# Patient Record
Sex: Female | Born: 1950 | Race: White | Hispanic: No | Marital: Married | Smoking: Never smoker
Health system: Southern US, Community
[De-identification: ages and names within clinical notes are randomized; demographics above are authoritative.]

## PROBLEM LIST (undated history)

## (undated) ENCOUNTER — Ambulatory Visit: Admission: EM | Disposition: A | Discharge status: Other Acute Inpatient Hospital | Payer: Medicare HMO

## (undated) DIAGNOSIS — F419 Anxiety disorder, unspecified: Secondary | ICD-10-CM

## (undated) DIAGNOSIS — I2510 Atherosclerotic heart disease of native coronary artery without angina pectoris: Secondary | ICD-10-CM

## (undated) DIAGNOSIS — K222 Esophageal obstruction: Secondary | ICD-10-CM

## (undated) DIAGNOSIS — R0600 Dyspnea, unspecified: Secondary | ICD-10-CM

## (undated) DIAGNOSIS — F329 Major depressive disorder, single episode, unspecified: Secondary | ICD-10-CM

## (undated) DIAGNOSIS — K449 Diaphragmatic hernia without obstruction or gangrene: Secondary | ICD-10-CM

## (undated) DIAGNOSIS — E785 Hyperlipidemia, unspecified: Secondary | ICD-10-CM

## (undated) DIAGNOSIS — R7303 Prediabetes: Secondary | ICD-10-CM

## (undated) DIAGNOSIS — I10 Essential (primary) hypertension: Secondary | ICD-10-CM

## (undated) DIAGNOSIS — Z87442 Personal history of urinary calculi: Secondary | ICD-10-CM

## (undated) DIAGNOSIS — N200 Calculus of kidney: Secondary | ICD-10-CM

## (undated) DIAGNOSIS — I951 Orthostatic hypotension: Secondary | ICD-10-CM

## (undated) DIAGNOSIS — F32A Depression, unspecified: Secondary | ICD-10-CM

## (undated) DIAGNOSIS — K589 Irritable bowel syndrome without diarrhea: Secondary | ICD-10-CM

## (undated) DIAGNOSIS — G4730 Sleep apnea, unspecified: Secondary | ICD-10-CM

## (undated) DIAGNOSIS — K219 Gastro-esophageal reflux disease without esophagitis: Secondary | ICD-10-CM

## (undated) DIAGNOSIS — K5790 Diverticulosis of intestine, part unspecified, without perforation or abscess without bleeding: Secondary | ICD-10-CM

## (undated) HISTORY — DX: Atherosclerotic heart disease of native coronary artery without angina pectoris: I25.10

## (undated) HISTORY — DX: Hyperlipidemia, unspecified: E78.5

## (undated) HISTORY — DX: Calculus of kidney: N20.0

## (undated) HISTORY — DX: Irritable bowel syndrome, unspecified: K58.9

## (undated) HISTORY — DX: Esophageal obstruction: K22.2

## (undated) HISTORY — DX: Depression, unspecified: F32.A

## (undated) HISTORY — DX: Gastro-esophageal reflux disease without esophagitis: K21.9

## (undated) HISTORY — DX: Diaphragmatic hernia without obstruction or gangrene: K44.9

## (undated) HISTORY — PX: COLONOSCOPY: SHX174

## (undated) HISTORY — DX: Diverticulosis of intestine, part unspecified, without perforation or abscess without bleeding: K57.90

## (undated) HISTORY — PX: ABDOMINAL HYSTERECTOMY: SHX81

## (undated) HISTORY — DX: Essential (primary) hypertension: I10

## (undated) HISTORY — DX: Orthostatic hypotension: I95.1

## (undated) HISTORY — PX: TONSILLECTOMY: SUR1361

## (undated) HISTORY — DX: Major depressive disorder, single episode, unspecified: F32.9

## (undated) HISTORY — PX: TUBAL LIGATION: SHX77

## (undated) HISTORY — PX: VESICOVAGINAL FISTULA CLOSURE W/ TAH: SUR271

## (undated) HISTORY — PX: KIDNEY STONE SURGERY: SHX686

---

## 2002-09-06 ENCOUNTER — Ambulatory Visit (HOSPITAL_COMMUNITY): Admission: RE | Admit: 2002-09-06 | Discharge: 2002-09-06 | Payer: Self-pay | Admitting: Internal Medicine

## 2002-09-06 ENCOUNTER — Encounter: Payer: Self-pay | Admitting: Internal Medicine

## 2002-12-24 ENCOUNTER — Encounter: Payer: Self-pay | Admitting: Internal Medicine

## 2002-12-24 ENCOUNTER — Ambulatory Visit (HOSPITAL_COMMUNITY): Admission: RE | Admit: 2002-12-24 | Discharge: 2002-12-24 | Payer: Self-pay | Admitting: Internal Medicine

## 2003-01-30 ENCOUNTER — Encounter: Payer: Self-pay | Admitting: Internal Medicine

## 2003-01-30 ENCOUNTER — Ambulatory Visit (HOSPITAL_COMMUNITY): Admission: RE | Admit: 2003-01-30 | Discharge: 2003-01-30 | Payer: Self-pay | Admitting: Internal Medicine

## 2004-01-04 ENCOUNTER — Emergency Department (HOSPITAL_COMMUNITY): Admission: AD | Admit: 2004-01-04 | Discharge: 2004-01-04 | Payer: Self-pay | Admitting: Internal Medicine

## 2004-03-24 ENCOUNTER — Emergency Department (HOSPITAL_COMMUNITY): Admission: EM | Admit: 2004-03-24 | Discharge: 2004-03-24 | Payer: Self-pay | Admitting: Family Medicine

## 2004-05-25 ENCOUNTER — Emergency Department (HOSPITAL_COMMUNITY): Admission: EM | Admit: 2004-05-25 | Discharge: 2004-05-25 | Payer: Self-pay | Admitting: Family Medicine

## 2004-06-12 ENCOUNTER — Emergency Department (HOSPITAL_COMMUNITY): Admission: EM | Admit: 2004-06-12 | Discharge: 2004-06-12 | Payer: Self-pay | Admitting: Internal Medicine

## 2004-08-20 ENCOUNTER — Other Ambulatory Visit: Admission: RE | Admit: 2004-08-20 | Discharge: 2004-08-20 | Payer: Self-pay | Admitting: *Deleted

## 2004-10-16 ENCOUNTER — Emergency Department (HOSPITAL_COMMUNITY): Admission: EM | Admit: 2004-10-16 | Discharge: 2004-10-16 | Payer: Self-pay | Admitting: Family Medicine

## 2004-11-03 ENCOUNTER — Emergency Department (HOSPITAL_COMMUNITY): Admission: EM | Admit: 2004-11-03 | Discharge: 2004-11-03 | Payer: Self-pay | Admitting: Family Medicine

## 2004-11-27 ENCOUNTER — Emergency Department (HOSPITAL_COMMUNITY): Admission: EM | Admit: 2004-11-27 | Discharge: 2004-11-27 | Payer: Self-pay | Admitting: Family Medicine

## 2004-12-04 ENCOUNTER — Emergency Department (HOSPITAL_COMMUNITY): Admission: EM | Admit: 2004-12-04 | Discharge: 2004-12-04 | Payer: Self-pay | Admitting: Family Medicine

## 2005-03-01 ENCOUNTER — Encounter: Admission: RE | Admit: 2005-03-01 | Discharge: 2005-05-30 | Payer: Self-pay

## 2005-09-13 ENCOUNTER — Ambulatory Visit: Payer: Self-pay | Admitting: Internal Medicine

## 2005-09-14 ENCOUNTER — Ambulatory Visit: Payer: Self-pay | Admitting: Internal Medicine

## 2005-09-14 ENCOUNTER — Encounter (INDEPENDENT_AMBULATORY_CARE_PROVIDER_SITE_OTHER): Payer: Self-pay | Admitting: *Deleted

## 2005-09-14 HISTORY — PX: ESOPHAGOGASTRODUODENOSCOPY: SHX1529

## 2006-03-28 ENCOUNTER — Emergency Department (HOSPITAL_COMMUNITY): Admission: EM | Admit: 2006-03-28 | Discharge: 2006-03-28 | Payer: Self-pay | Admitting: Family Medicine

## 2006-06-15 ENCOUNTER — Emergency Department (HOSPITAL_COMMUNITY): Admission: AD | Admit: 2006-06-15 | Discharge: 2006-06-15 | Payer: Self-pay | Admitting: Family Medicine

## 2006-07-26 ENCOUNTER — Encounter: Admission: RE | Admit: 2006-07-26 | Discharge: 2006-07-26 | Payer: Self-pay | Admitting: Otolaryngology

## 2007-02-20 ENCOUNTER — Emergency Department (HOSPITAL_COMMUNITY): Admission: EM | Admit: 2007-02-20 | Discharge: 2007-02-20 | Payer: Self-pay | Admitting: Family Medicine

## 2007-07-30 ENCOUNTER — Emergency Department (HOSPITAL_COMMUNITY): Admission: EM | Admit: 2007-07-30 | Discharge: 2007-07-30 | Payer: Self-pay | Admitting: Family Medicine

## 2008-04-06 ENCOUNTER — Emergency Department (HOSPITAL_COMMUNITY): Admission: EM | Admit: 2008-04-06 | Discharge: 2008-04-06 | Payer: Self-pay | Admitting: Family Medicine

## 2008-05-22 ENCOUNTER — Other Ambulatory Visit: Admission: RE | Admit: 2008-05-22 | Discharge: 2008-05-22 | Payer: Self-pay | Admitting: Family Medicine

## 2008-09-20 ENCOUNTER — Emergency Department (HOSPITAL_COMMUNITY): Admission: EM | Admit: 2008-09-20 | Discharge: 2008-09-20 | Payer: Self-pay | Admitting: Family Medicine

## 2009-11-09 ENCOUNTER — Emergency Department (HOSPITAL_COMMUNITY): Admission: EM | Admit: 2009-11-09 | Discharge: 2009-11-09 | Payer: Self-pay | Admitting: Emergency Medicine

## 2010-10-01 ENCOUNTER — Encounter: Payer: Self-pay | Admitting: Family Medicine

## 2010-12-05 HISTORY — PX: ANKLE FRACTURE SURGERY: SHX122

## 2011-04-18 ENCOUNTER — Emergency Department (HOSPITAL_COMMUNITY): Payer: Worker's Compensation

## 2011-04-18 ENCOUNTER — Inpatient Hospital Stay (HOSPITAL_COMMUNITY)
Admission: EM | Admit: 2011-04-18 | Discharge: 2011-04-19 | DRG: 494 | Disposition: A | Discharge status: Home Health | Payer: Worker's Compensation | Attending: Orthopedic Surgery | Admitting: Orthopedic Surgery

## 2011-04-18 ENCOUNTER — Inpatient Hospital Stay (HOSPITAL_COMMUNITY): Payer: Worker's Compensation

## 2011-04-18 ENCOUNTER — Inpatient Hospital Stay: Admit: 2011-04-18 | Payer: Worker's Compensation

## 2011-04-18 DIAGNOSIS — I10 Essential (primary) hypertension: Secondary | ICD-10-CM | POA: Diagnosis present

## 2011-04-18 DIAGNOSIS — Z79899 Other long term (current) drug therapy: Secondary | ICD-10-CM

## 2011-04-18 DIAGNOSIS — E785 Hyperlipidemia, unspecified: Secondary | ICD-10-CM | POA: Diagnosis present

## 2011-04-18 DIAGNOSIS — S82853A Displaced trimalleolar fracture of unspecified lower leg, initial encounter for closed fracture: Principal | ICD-10-CM | POA: Diagnosis present

## 2011-04-18 DIAGNOSIS — Y999 Unspecified external cause status: Secondary | ICD-10-CM

## 2011-04-18 DIAGNOSIS — K219 Gastro-esophageal reflux disease without esophagitis: Secondary | ICD-10-CM | POA: Diagnosis present

## 2011-04-18 DIAGNOSIS — Y9229 Other specified public building as the place of occurrence of the external cause: Secondary | ICD-10-CM

## 2011-04-18 DIAGNOSIS — Y939 Activity, unspecified: Secondary | ICD-10-CM

## 2011-04-18 DIAGNOSIS — W108XXA Fall (on) (from) other stairs and steps, initial encounter: Secondary | ICD-10-CM | POA: Diagnosis present

## 2011-04-18 LAB — CBC
HCT: 33.6 % — ABNORMAL LOW (ref 36.0–46.0)
Hemoglobin: 11.3 g/dL — ABNORMAL LOW (ref 12.0–15.0)
MCH: 27.9 pg (ref 26.0–34.0)
MCHC: 33.6 g/dL (ref 30.0–36.0)
MCV: 83 fL (ref 78.0–100.0)
Platelets: 345 K/uL (ref 150–400)
RBC: 4.05 MIL/uL (ref 3.87–5.11)
RDW: 13.5 % (ref 11.5–15.5)
WBC: 8.7 K/uL (ref 4.0–10.5)

## 2011-04-18 LAB — DIFFERENTIAL
Basophils Absolute: 0 K/uL (ref 0.0–0.1)
Basophils Relative: 0 % (ref 0–1)
Eosinophils Absolute: 0.2 K/uL (ref 0.0–0.7)
Eosinophils Relative: 2 % (ref 0–5)
Lymphocytes Relative: 22 % (ref 12–46)
Lymphs Abs: 1.9 10*3/uL (ref 0.7–4.0)
Monocytes Absolute: 0.6 K/uL (ref 0.1–1.0)
Monocytes Relative: 7 % (ref 3–12)
Neutro Abs: 6 10*3/uL (ref 1.7–7.7)
Neutrophils Relative %: 68 % (ref 43–77)

## 2011-04-18 LAB — BASIC METABOLIC PANEL
BUN: 18 mg/dL (ref 6–23)
CO2: 28 mEq/L (ref 19–32)
Calcium: 8.4 mg/dL (ref 8.4–10.5)
Creatinine, Ser: 0.94 mg/dL (ref 0.4–1.2)
GFR calc Af Amer: >60 mL/min (ref >60)
Glucose, Bld: 115 mg/dL — ABNORMAL HIGH (ref 70–99)

## 2011-04-18 LAB — BASIC METABOLIC PANEL WITH GFR
Chloride: 104 meq/L (ref 96–112)
GFR calc non Af Amer: >60 mL/min (ref >60)
Potassium: 3.1 meq/L — ABNORMAL LOW (ref 3.5–5.1)
Sodium: 141 meq/L (ref 135–145)

## 2011-04-18 LAB — PROTIME-INR
INR: 0.96 (ref 0.00–1.49)
Prothrombin Time: 13 seconds (ref 11.6–15.2)

## 2011-04-18 LAB — APTT: aPTT: 27 s (ref 24–37)

## 2011-04-18 LAB — TYPE AND SCREEN
ABO/RH(D): A POS
Antibody Screen: NEGATIVE

## 2011-04-18 LAB — ABO/RH: ABO/RH(D): A POS

## 2011-04-19 LAB — CBC
MCH: 27.7 pg (ref 26.0–34.0)
MCHC: 33.2 g/dL (ref 30.0–36.0)
Platelets: 284 10*3/uL (ref 150–400)
RBC: 3.58 MIL/uL — ABNORMAL LOW (ref 3.87–5.11)
RDW: 13.4 % (ref 11.5–15.5)

## 2011-04-19 LAB — BASIC METABOLIC PANEL
Calcium: 7.9 mg/dL — ABNORMAL LOW (ref 8.4–10.5)
Creatinine, Ser: 0.75 mg/dL (ref 0.4–1.2)
GFR calc Af Amer: >60 mL/min (ref >60)
GFR calc non Af Amer: >60 mL/min (ref >60)

## 2011-04-24 NOTE — Consult Note (Signed)
NAME:  Debbie [name], Debbie [name]               ACCOUNT NO.:  [id number]  MEDICAL RECORD NO.:  [id number]           PATIENT TYPE:  I  LOCATION:  1603                         FACILITY:  [facility]  PHYSICIAN:  Estill Bamberg, MD      DATE OF BIRTH:  [birth year]  DATE OF CONSULTATION:  04/18/2011 DATE OF DISCHARGE:                                CONSULTATION   REASON FOR CONSULTATION:  Right ankle fracture.  HISTORY OF PRESENT ILLNESS:  Debbie [name] is a pleasant 60 year old female who I was asked to see in consultation by the [facility] Emergency Department.  Per the patient, she is in the process of getting a mammogram earlier today.  The patient lost her footing and her ankle twisted from beneath her.  Her ankle confirmed twisting maneuver and she fell on her back and had immediate onset of pain in the right ankle. The patient was ultimately evaluated at the [facility] Emergency Department, evaluated and diagnosed with a dislocated right ankle.  I did talk to the emergency room physician personally, and it did appear that the emergency room physician did feel that the ankle was reduced however, I did subsequent review of the radiographs which were consistent with ongoing as posterior subluxation of the talus in relation to the tibia.  I had seen.  I therefore did feel that this that this was a unstable fracture.  I did come to evaluate the patient for consideration of external fixation.  The patient denied any numbness or tingling in her bilateral feet.  She denied any pain elsewhere other than in her right ankle.  She described the pain as moderate-to-severe but the pain did decrease at the time of my evaluation with the patient.  PAST MEDICAL HISTORY:  Hypertension, hyperlipidemia in addition to her gastroesophageal reflux disease.  PAST SURGICAL HISTORY:  Hysterectomy and kidney stone removal.  MEDICATIONS:  Benazepril, Cymbalta, Lipitor, Nexium, Trilipix, Zyrtec, and  potassium.  ALLERGIES:  No known drug allergies.  SOCIAL HISTORY:  The patient denies alcohol or tobacco use.  PHYSICAL EXAMINATION:  The patient's right lower extremity is immobilized in a posterior mold splint.  She does have full sensation throughout all 5 toes.  The skin about the proximal leg and toes are intact.  The posterior mold splint is well applied.  The patient does have positive FHL and EHL which is limited due to pain and swelling. There is noted to be swelling about the toes.  Her capillary refill is less than 2 seconds.  Visualized skin is noted to be intact.  Three views of the patient's right ankle from prior and subsequent to her attempted right ankle reduction are significant with a significant trimalleolar ankle fracture with ongoing posterior subluxation of the talus.  The posterior malleolar fragment does occupy approximately 70% of the articular surface of the tibia.  This clearly is an unstable ankle fracture.  ASSESSMENT:  Trimalleolar right ankle fracture, unstable, and dislocated posteriorly status post one reduction attempt.  PLAN:  I did have an extensive discussion with Ms. Kopinski and her husband.  I did let her know that since her previous  reduction has been unsuccessful in given the radiographs noted, this is an unstable injury requiring operative intervention.  I did explain temporizing the patient with an external fixator to help restore the mortise of the ankle joint. The ultimate plan following her external fixation is to discharge the patient home and her followup with Dr. Toni Arthurs.  She was given Dr. Laverta Baltimore contact information, and I did personally speak with Dr. Victorino Dike myself.  I did fully explain the risks and limitations of external fixation and the patient did agree to go forward.  The plan is to go ahead with external fixation of the ankle within the next 30 minutes.     Estill Bamberg, MD     MD/MEDQ  D:  04/18/2011  T:   04/19/2011  Job:  [id number]  cc:   Toni Arthurs, MD Fax: [phone]  Electronically Signed by Estill Bamberg  on 04/24/2011 02:24:36 PM

## 2011-04-24 NOTE — Op Note (Signed)
  NAME:  RYLEI, MASELLA               ACCOUNT NO.:  [id number]  MEDICAL RECORD NO.:  [id number]           PATIENT TYPE:  I  LOCATION:  1603                         FACILITY:  [facility]  PHYSICIAN:  Estill Bamberg, MD      DATE OF BIRTH:  Oct 10, 1951  DATE OF PROCEDURE:  04/18/2011 DATE OF DISCHARGE:                              OPERATIVE REPORT   PREOPERATIVE DIAGNOSIS:  Unstable right ankle fracture.  POSTOPERATIVE DIAGNOSIS:  Unstable right ankle fracture.  PROCEDURES: 1. Closed reduction of right dislocated ankle. 2. Placement of external fixator to the right ankle. 3. Intraoperative use of fluoroscopy.  INSTRUMENTATION USED:  Synthes  SURGEON:  Estill Bamberg, MD  ASSISTANT:  None.  ANESTHESIA:  General endotracheal anesthesia.  COMPLICATIONS:  None.  DISPOSITION:  Stable.  INDICATIONS FOR PROCEDURE:  Briefly, Ms. Grether is a very pleasant 60- year-old female who [facility] Emergency Departmentwith a dislocated right ankle.  Radiographs did reveal a trimalleolar ankle fracture.  An attempted reduction was performed by emergency room physician, but the patient did re-dislocate.  This was diagnosed for an unstable ankle fracture.  I did have a discussion with the patient regarding going forward closed reduction and external fixation of right ankle.  The patient fully understood the risks and limitations of the procedure.  OPERATIVE DETAILS:  On Apr 18, 2011, the patient was brought to Surgery and general endotracheal anesthesia was administered.  The right ankle and foot were prepped and draped in the usual sterile fashion.  Two grams of Ancef was given for antibiotic prophylaxis.  A time-out procedure was performed.  I did place 2 external fixation pins in the diaphysis of the tibia and a single pin in the calcaneus.  I then performed a closed reduction under live fluoroscopy and I did note an obvious reduction of the posteriorly dislocated ankle joint.   I did hold the foot anteriorly while an assistant placed bars to hook up with the pins in the tibia and calcaneus.  The bladder were locked down securely in the usual fashion.  I did then again obtain the AP and lateral views of the ankle joint which did confirm adequate reduction.  The patient's cap refill was less than 2 seconds at all toes.  Xeroform was placed overlying the pin sites. Kerlix and Webril was also placed followed by an Ace bandage.  The patient was then awoken from general endotracheal anesthesia and transferred to recovery in stable condition.     Estill Bamberg, MD     MD/MEDQ  D:  04/18/2011  T:  04/19/2011  Job:  [id number]  cc:   Toni Arthurs, MD Fax: [phone]  Electronically Signed by Estill Bamberg  on 04/24/2011 02:24:40 PM

## 2011-04-25 ENCOUNTER — Ambulatory Visit
Admission: RE | Admit: 2011-04-25 | Discharge: 2011-04-25 | Disposition: A | Discharge status: Home / Self Care | Payer: Worker's Compensation | Source: Ambulatory Visit | Attending: Orthopedic Surgery | Admitting: Orthopedic Surgery

## 2011-04-25 ENCOUNTER — Other Ambulatory Visit: Payer: Self-pay | Admitting: Orthopedic Surgery

## 2011-04-25 DIAGNOSIS — T07XXXA Unspecified multiple injuries, initial encounter: Secondary | ICD-10-CM

## 2011-04-29 ENCOUNTER — Ambulatory Visit (HOSPITAL_COMMUNITY): Payer: Worker's Compensation

## 2011-04-29 ENCOUNTER — Ambulatory Visit (HOSPITAL_BASED_OUTPATIENT_CLINIC_OR_DEPARTMENT_OTHER)
Admission: RE | Admit: 2011-04-29 | Discharge: 2011-04-30 | Disposition: A | Discharge status: Home / Self Care | Payer: Worker's Compensation | Source: Ambulatory Visit | Attending: Orthopedic Surgery | Admitting: Orthopedic Surgery

## 2011-04-29 DIAGNOSIS — Y990 Civilian activity done for income or pay: Secondary | ICD-10-CM | POA: Insufficient documentation

## 2011-04-29 DIAGNOSIS — W19XXXA Unspecified fall, initial encounter: Secondary | ICD-10-CM | POA: Insufficient documentation

## 2011-04-29 DIAGNOSIS — Z4789 Encounter for other orthopedic aftercare: Secondary | ICD-10-CM | POA: Insufficient documentation

## 2011-04-29 DIAGNOSIS — S82843A Displaced bimalleolar fracture of unspecified lower leg, initial encounter for closed fracture: Secondary | ICD-10-CM | POA: Insufficient documentation

## 2011-04-29 DIAGNOSIS — S82209A Unspecified fracture of shaft of unspecified tibia, initial encounter for closed fracture: Secondary | ICD-10-CM | POA: Insufficient documentation

## 2011-04-29 DIAGNOSIS — S82409A Unspecified fracture of shaft of unspecified fibula, initial encounter for closed fracture: Secondary | ICD-10-CM | POA: Insufficient documentation

## 2011-04-29 DIAGNOSIS — Y9269 Other specified industrial and construction area as the place of occurrence of the external cause: Secondary | ICD-10-CM | POA: Insufficient documentation

## 2011-04-29 LAB — POCT I-STAT, CHEM 8
HCT: 35 % — ABNORMAL LOW (ref 36.0–46.0)
Hemoglobin: 11.9 g/dL — ABNORMAL LOW (ref 12.0–15.0)
Potassium: 3.6 mEq/L (ref 3.5–5.1)
Sodium: 139 mEq/L (ref 135–145)
TCO2: 32 mmol/L (ref 0–100)

## 2011-05-03 NOTE — Op Note (Signed)
NAME:  Debbie [name], Debbie [name]               ACCOUNT NO.:  [id number]  MEDICAL RECORD NO.:  [id number]           PATIENT TYPE:  LOCATION:                                 FACILITY:  PHYSICIAN:  Toni Arthurs, MD        DATE OF BIRTH:  1951-04-13  DATE OF PROCEDURE:  04/29/2011 DATE OF DISCHARGE:                              OPERATIVE REPORT   PREOPERATIVE DIAGNOSES: 1. Right tibial pilon and fibula fractures. 2. Status post closed reduction and external fixation of above     injuries.  POSTOPERATIVE DIAGNOSES: 1. Right tibial pilon and fibula fractures. 2. Status post closed reduction and external fixation of above     injuries.  PROCEDURE: 1. Removal of external fixation under anesthesia. 2. Open reduction and internal fixation of right tibial pilon and     fibula fractures. 3. Intraoperative interpretation of fluoroscopic images greater than 1     hour. 4. Stress fluoroscopic examination of the right ankle.  SURGEON:  Toni Arthurs, MD  ANESTHESIA:  General, regional.  IV FLUIDS:  See anesthesia record.  ESTIMATED BLOOD LOSS:  Minimal.  TOURNIQUET TIME:  Two hours and 41 minutes at 250 mmHg.  COMPLICATIONS:  None apparent.  DISPOSITION:  Extubated, awake, and stable to recovery.  INDICATIONS FOR PROCEDURE:  The patient is a 60 year old female without significant past medical history who fell at work approximately a week ago.  She sustained a severe fracture of the right distal tibia and fibula.  She was taken to surgery that night by Dr. Estill Bamberg where she underwent closed reduction and application of an external fixator. She now presents for definitive treatment of this injury.  She understands the risks and benefits of this procedure as well as the alternative treatment options.  Specifically, she understands risks of bleeding, infection, nerve damage, blood clots, need for additional surgery, amputation, and death.  PROCEDURE IN DETAIL:  After preoperative consent  was obtained and the correct operative site was identified, the patient was brought to the operating room and placed supine on the operating table.  General anesthesia was induced.  Preoperative antibiotics were administered. Surgical time-out was taken.  The external fixator frame was removed in its entirety including the two half pins in the tibia and the transfixion pin in the calcaneus.  The right lower extremity was then prepped and draped in standard sterile fashion.  The pinholes were all curetted and debrided of superficial eschar and necrotic tissue.  All of the pin tracts were irrigated copiously.  The pin tracts were all covered with Ioban drape.  The extremity was exsanguinated and the tourniquet was inflated to 250 mmHg.  Medial longitudinal incision was marked on the skin overlying the fracture site in the medial malleolus. This was made and sharp dissection was carried down through the skin and subcutaneous tissue taking care to protect the branches of the saphenous vein and nerve.  The fracture site was identified.  This was a coronal oblique split from the posterior aspect of the tibia proximally to central portion of the pilon distally.  There was comminution at the level of the  tibial plafond.  The fracture site was opened and cleaned of all hematoma.  The central fragment of articular cartilage was reduced.  The large posterior fragment was then reduced and pinned into position.  It was also clamped with a tenaculum.  The medial malleolus fragment was reduced and pinned into position as well.  AP and lateral views were obtained showing appropriate reduction of the fracture site. Three cannulated 4-mm partially-threaded screws were inserted across the primary fracture line.  Two of these were inserted from anterior to posterior.  The third was inserted from posterior to anterior with a washer.  This acted as a buttress at the apex of the fracture site.  The medial  malleolus fracture fragment was then fixed with a cannulated 4-mm screw.  The posterior fragment of the medial malleolus was then pinned in position in a reduced fashion.  A 2 x 7 solid lag screw was inserted after pre-drilling.  The large medial cortical fragment was then reduced and pinned into position.  Again, it was fixed with a 4-mm cannulated screw.  AP, lateral, and mortise views again showed appropriate reduction of the fracture site and appropriate position and length of all hardware.  The wound was irrigated copiously.  Inverted simple sutures of 0 Vicryl were used to close the periosteum over the fracture site.  Subcutaneous tissue was approximated with inverted simple sutures of 3-0 Monocryl.  The skin was closed with a running 3-0 Prolene suture.  Attention was then turned to the lateral aspect of the ankle.  A longitudinal incision was made over the fibula.  Sharp dissection was carried down through the skin.  Blunt dissection was carried down to the subcutaneous tissues adjacent to the lateral fibula.  There was severe comminution noted at the distal aspect of the fibula just adjacent to the syndesmosis.  One of the fragments anteriorly was turned 180 degrees on its periosteal flap.  The fracture site was opened and cleaned of all hematoma and free fragments of bone.  The anterior fragment was derotated and reduced anatomically.  The fracture distally then was reduced and clamped into position with two tenaculum.  Two 3.5-mm fully- threaded lag screws were inserted across the fracture site from anterior to posterior.  Both of these were noted to have excellent compression and held the fracture reduced anatomically.  A 10-hole one-third tubular plate was then contoured to fit the lateral aspect of the fibula.  It was secured with three bicortical screws proximally, one bicortical screws centrally, and three unicortical screws distally.  A mortise view was then obtained  showing appropriate reduction of the fibular fracture. A stress mortise view was then obtained under live fluoroscopy showing no widening at the medial clear space or at the syndesmosis.  Final AP, lateral, and mortise views of the ankle showed appropriate reduction of the fracture site as well as appropriate position and length of all hardware.  The lateral wound was irrigated copiously.  The subcutaneous tissue was approximated over the plate with 0 Vicryl sutures.  The subcutaneous tissue was then closed with simple sutures of 3-0 Monocryl and 3-0 running Prolene suture was used to close the skin incision. Sterile dressings were applied followed by a well-padded short-leg splint with the ankle positioned in neutral.  The tourniquet was released at 2 hours and 41 minutes after application of the dressings. The patient was then awakened by Anesthesia and transported to the recovery room in stable condition.  FOLLOWUP PLAN:  The patient will be  observed overnight for pain control. She will follow up with me in 2 weeks for suture removal and conversion to a cast.     Toni Arthurs, MD     JH/MEDQ  D:  04/29/2011  T:  04/30/2011  Job:  [id number]  cc:   Estill Bamberg, MD  Electronically Signed by Toni Arthurs  on 05/03/2011 01:39:09 PM

## 2011-05-17 NOTE — Discharge Summary (Signed)
  NAME:  Debbie [name], Debbie [name] NO.:  [id number]  MEDICAL RECORD NO.:  [id number]  LOCATION:  1603                         FACILITY:  [facility]  PHYSICIAN:  Estill Bamberg, MD      DATE OF BIRTH:  10/22/1951  DATE OF ADMISSION:  04/18/2011 DATE OF DISCHARGE:  04/19/2011                              DISCHARGE SUMMARY   ADMISSION DIAGNOSIS:  Right tibial pilon fracture.  DISCHARGE DIAGNOSIS:  Right tibial pilon fracture, status post closed reduction and placement of external fixator.  ADMISSION HISTORY:  Briefly, Ms. Kole is a pleasant 60 year old female who presented to the emergency department on Apr 18, 2011, with an extremely unstable right ankle fracture with tibial pilon component. A closed reduction was attempted in the ER and the reduction did fail and the patient did go on to dislocate.  I was called to evaluate the patient.  I did diagnose the patient with an unstable right ankle fracture and I did bring the patient to Surgery.  HOSPITAL COURSE:  On Apr 18, 2011, the patient was brought to Surgery and underwent closed reduction and placement of an external fixator to the right ankle.  The patient tolerated the procedure well and was transferred to recovery in stable condition.  The patient was observed overnight for pain control.  An excellent reduction was noted.  The patient was evaluated on the morning of postop day #1 and was ultimately discharged home.  DISCHARGE INSTRUCTIONS:  The patient will be maintained in her external fixator.  She will follow up with Dr. Toni Arthurs for definitive fixation of her fracture.  All of her questions were answered in their entirety.     Estill Bamberg, MD     MD/MEDQ  D:  05/11/2011  T:  05/11/2011  Job:  [id number]  Electronically Signed by Estill Bamberg  on 05/17/2011 04:45:16 PM

## 2011-09-16 LAB — POCT URINALYSIS DIP (DEVICE)
Glucose, UA: NEGATIVE
Nitrite: POSITIVE — AB
Operator id: 116391
Urobilinogen, UA: 1

## 2011-09-16 LAB — POCT I-STAT CREATININE: Operator id: 235561

## 2011-09-16 LAB — URINALYSIS, ROUTINE W REFLEX MICROSCOPIC
Hgb urine dipstick: NEGATIVE
Protein, ur: NEGATIVE
Urobilinogen, UA: 1

## 2011-09-16 LAB — DIFFERENTIAL
Basophils Relative: 0
Eosinophils Absolute: 0
Monocytes Absolute: 0.4
Monocytes Relative: 4
Neutrophils Relative %: 88 — ABNORMAL HIGH

## 2011-09-16 LAB — I-STAT 8, (EC8 V) (CONVERTED LAB)
Acid-Base Excess: 4 — ABNORMAL HIGH
Chloride: 101
Potassium: 3 — ABNORMAL LOW
pH, Ven: 7.421 — ABNORMAL HIGH

## 2011-09-16 LAB — CBC
Hemoglobin: 14
MCHC: 33.6
MCV: 84.1
RBC: 4.97
RDW: 14

## 2011-09-16 LAB — URINE CULTURE

## 2011-09-16 LAB — HEPATIC FUNCTION PANEL
Bilirubin, Direct: 0.2
Indirect Bilirubin: 0.7
Total Bilirubin: 0.9

## 2012-08-09 ENCOUNTER — Other Ambulatory Visit: Payer: 59

## 2012-08-09 ENCOUNTER — Ambulatory Visit (INDEPENDENT_AMBULATORY_CARE_PROVIDER_SITE_OTHER): Payer: 59 | Admitting: Internal Medicine

## 2012-08-09 ENCOUNTER — Encounter: Payer: Self-pay | Admitting: Internal Medicine

## 2012-08-09 VITALS — BP 126/70 | HR 95 | Ht 64.0 in | Wt 178.6 lb

## 2012-08-09 DIAGNOSIS — J0101 Acute recurrent maxillary sinusitis: Secondary | ICD-10-CM

## 2012-08-09 DIAGNOSIS — J302 Other seasonal allergic rhinitis: Secondary | ICD-10-CM

## 2012-08-09 DIAGNOSIS — J309 Allergic rhinitis, unspecified: Secondary | ICD-10-CM

## 2012-08-09 DIAGNOSIS — J45909 Unspecified asthma, uncomplicated: Secondary | ICD-10-CM

## 2012-08-09 DIAGNOSIS — J0100 Acute maxillary sinusitis, unspecified: Secondary | ICD-10-CM

## 2012-08-09 NOTE — Patient Instructions (Addendum)
Order- Lab Allergy Profile  Dx allergic rhinitis  Schedule return for allergy skin testing- Antihistamines block the test, so PLEASE-      No antihistamine products for 3 days before your skin tests: including cough, allergy  and cold remedies, otc sleep meds. This would include your zyrtec.

## 2012-08-09 NOTE — Progress Notes (Signed)
08/09/12- 61 yoF never smoker self-referred for allergy evaluation because of recurrent sinus infections in the past 10 years or more.She describes itching of skin and eyes with nasal congestion and maxillary pressure which may hurt her teeth as much as one time per month. He finds that early antibiotic intervention helps. Perennial nasal congestion and rhinitis began around age 61 and she blames environmental allergy. Eyes and throat itch very soon after exposure to cat. Allergy skin testing 20 years ago. She does not remember the results but was not on allergy vaccine. Has had episodes of asthmatic bronchitis but no formal diagnosis of asthma or pneumonia. No history of food intolerance or skin rash. Large local reactions to bumblebee sting. Tonsils out otherwise no ENT surgery. Uses saline nasal spray. Uses Flonase only if she feels she is getting sick. Last prednisone was 3 or 4 days ago. 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 Authorizing Provider  benazepril-hydrochlorthiazide (LOTENSIN HCT) 20-12.5 MG per tablet Take 1 tablet by mouth Twice daily. 08/07/12  Yes Historical Provider, MD  cetirizine (ZYRTEC) 10 MG tablet Take 10 mg by mouth daily.   Yes Historical Provider, MD  Choline Fenofibrate (TRILIPIX) 45 MG capsule Take 45 mg by mouth daily.   Yes Historical Provider, MD  CYMBALTA 60 MG capsule Take 2 tablets by mouth daily. 05/21/12  Yes Historical Provider, MD  fluticasone (FLONASE) 50 MCG/ACT nasal spray Place 2 sprays into both nostrils daily. 05/09/12  Yes Historical Provider, MD  KLOR-CON M20 20 MEQ tablet Take 1 tablet by mouth daily. 08/06/12  Yes Historical Provider, MD  NEXIUM 40 MG capsule Take 1 tablet by mouth daily. 05/21/12  Yes Historical Provider, MD  simvastatin (ZOCOR) 40 MG tablet  Take 1 tablet by mouth daily. 07/30/12  Yes Historical Provider, MD  VENTOLIN HFA 108 (90 BASE) MCG/ACT inhaler Inhale 2 puffs into the lungs 4 (four) times daily as needed. 06/10/12  Yes Historical Provider, MD   Past Medical History  Diagnosis Date  . Hyperlipidemia   . ALLERGIC RHINITIS   . Hypertension    Past Surgical History  Procedure Date  . Tubal ligation   . Vesicovaginal fistula closure w/ tah    Family History  Problem Relation Age of Onset  . Allergies Daughter   . Allergies Son   . Allergies Mother   . Heart disease Mother     bypass surgery  . Heart disease Father     bypass surgery   History   Social History  . Marital Status: Married    Spouse Name: N/A    Number of Children: N/A  . Years of Education: N/A   Occupational History  . Customer Service @ [employer]    Social History Main Topics  . Smoking status: Never Smoker   . Smokeless tobacco: Not on file  . Alcohol Use: No  . Drug Use: No  . Sexually Active: Not on file   Other Topics Concern  . Not on file   Social History Narrative  . No narrative on file    ROS-see HPI Constitutional:   No-   weight loss, night sweats, fevers, chills, fatigue, lassitude. HEENT:   No-  headaches, difficulty swallowing, tooth/dental problems, sore throat,       + sneezing, itching, ear ache, nasal congestion,  post nasal drip,  CV:  No-   chest pain, orthopnea, PND, swelling in lower extremities, anasarca,  dizziness, palpitations Resp: No-   shortness of breath with exertion or at rest.              No-   productive cough,  No non-productive cough,  No- coughing up of blood.              No-   change in color of mucus.  No- wheezing.   Skin: No-   rash or lesions. GI:  No-   heartburn, indigestion, abdominal pain, nausea, vomiting, diarrhea,                 change in bowel habits, loss of appetite GU: No-   dysuria, change in color of urine, no urgency or frequency.  No- flank pain. MS:  No-   joint pain  or swelling.  No- decreased range of motion.  No- back pain. Neuro-     nothing unusual Psych:  No- change in mood or affect. No depression or anxiety.  No memory loss.  OBJ- Physical Exam General- Alert, Oriented, Affect-appropriate, Distress- none acute Skin- rash-none, lesions- none, excoriation- none Lymphadenopathy- none Head- atraumatic            Eyes- Gross vision intact, PERRLA, conjunctivae and secretions clear            Ears- Hearing, canals-normal            Nose- Clear, no-Septal dev, mucus, polyps, erosion, perforation             Throat- Mallampati III-IV , mucosa clear , drainage- none, tonsils- atrophic Neck- flexible , trachea midline, no stridor , thyroid nl, carotid no bruit Chest - symmetrical excursion , unlabored           Heart/CV- RRR , no murmur , no gallop  , no rub, nl s1 s2                           - JVD- none , edema- none, stasis changes- none, varices- none           Lung- clear to P&A, wheeze- none, + cough with deep breath , dullness-none, rub- none           Chest wall-  Abd- tender-no, distended-no, bowel sounds-present, HSM- no Br/ Gen/ Rectal- Not done, not indicated Extrem- cyanosis- none, clubbing, none, atrophy- none, strength- nl Neuro- grossly intact to observation

## 2012-08-16 ENCOUNTER — Ambulatory Visit: Payer: Self-pay | Admitting: Women's Health

## 2012-08-16 LAB — ALLERGY FULL PROFILE
Aspergillus fumigatus, IgG: <0.1 kU/L
Bermuda Grass: <0.1 kU/L
Box Elder IgE: <0.1 kU/L
Candida Albicans: <0.1 kU/L
Curvularia lunata: <0.1 kU/L
D. farinae: <0.1 kU/L
Elm IgE: <0.1 kU/L
Fescue: <0.1 kU/L
G005 Rye, Perennial: <0.1 kU/L
G009 Red Top: <0.1 kU/L
Oak: <0.1 kU/L
Timothy Grass: <0.1 kU/L

## 2012-08-18 DIAGNOSIS — J302 Other seasonal allergic rhinitis: Secondary | ICD-10-CM | POA: Insufficient documentation

## 2012-08-18 DIAGNOSIS — J45909 Unspecified asthma, uncomplicated: Secondary | ICD-10-CM | POA: Insufficient documentation

## 2012-08-18 DIAGNOSIS — J0101 Acute recurrent maxillary sinusitis: Secondary | ICD-10-CM | POA: Insufficient documentation

## 2012-08-18 NOTE — Assessment & Plan Note (Signed)
Plan-allergy profile, return for allergy skin testing, use Neti pot for saline rinse at first sign of increasing nasal congestion. Discussed antihistamines versus decongestants.

## 2012-08-18 NOTE — Assessment & Plan Note (Signed)
She recently completed a round of prednisone and has had multiple antibiotics. I would like to emphasize drainage, saline rinse and decongestants

## 2012-08-18 NOTE — Assessment & Plan Note (Signed)
When stable we will get PFTs. I'm not sure how much asthma/reactive airways component she has this may all be recurrent acute bronchitis.

## 2012-08-20 ENCOUNTER — Telehealth: Payer: Self-pay | Admitting: Internal Medicine

## 2012-08-20 NOTE — Telephone Encounter (Signed)
            Result Notes     Notes Recorded by Waymon Budge, MD on 08/16/2012 at 11:28 AM Elevated allergy antibody levels for dust, cat and dog ------    Spoke with pt and notified of results per Dr. Maple Hudson. Pt verbalized understanding and denied any questions.

## 2012-08-20 NOTE — Telephone Encounter (Signed)
Pt called back again. She says it was her understanding that she would have been called back by the end of last week re: results. Debbie [name]

## 2012-08-20 NOTE — Telephone Encounter (Signed)
LMTCBx1 on work #, call home number and was advised pt not at home, also called cell number and LMTCBx1 as well. Carron Curie, CMA

## 2012-08-20 NOTE — Telephone Encounter (Signed)
Pt returned triage's call.  Holly D Pryor ° °

## 2012-08-22 NOTE — Progress Notes (Signed)
Quick Note:  Pt aware of results on 08-20-12 ______

## 2012-08-30 ENCOUNTER — Encounter: Payer: Self-pay | Admitting: Women's Health

## 2012-08-30 ENCOUNTER — Ambulatory Visit (INDEPENDENT_AMBULATORY_CARE_PROVIDER_SITE_OTHER): Payer: 59 | Admitting: Women's Health

## 2012-08-30 VITALS — BP 120/88 | Ht 64.0 in | Wt 175.0 lb

## 2012-08-30 DIAGNOSIS — E7800 Pure hypercholesterolemia, unspecified: Secondary | ICD-10-CM

## 2012-08-30 DIAGNOSIS — Z20828 Contact with and (suspected) exposure to other viral communicable diseases: Secondary | ICD-10-CM

## 2012-08-30 DIAGNOSIS — Z205 Contact with and (suspected) exposure to viral hepatitis: Secondary | ICD-10-CM

## 2012-08-30 DIAGNOSIS — Z01419 Encounter for gynecological examination (general) (routine) without abnormal findings: Secondary | ICD-10-CM

## 2012-08-30 DIAGNOSIS — Z780 Asymptomatic menopausal state: Secondary | ICD-10-CM

## 2012-08-30 DIAGNOSIS — I10 Essential (primary) hypertension: Secondary | ICD-10-CM

## 2012-08-30 NOTE — Addendum Note (Signed)
Addended by: Leonard Schwartz A on: 08/30/2012 03:06 PM   Modules accepted: Orders

## 2012-08-30 NOTE — Patient Instructions (Signed)

## 2012-08-30 NOTE — Progress Notes (Signed)
Debbie [name] [birth year] [id number]    History:    The patient presents for new patient annual exam.  Reports normal Pap history- last Pap 3 years ago. Normal mammogram history. Has not had a DEXA. Negative Colonoscopy 2006. 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 Hypertension, hypercholesterolemia, depression managed at [facility] Dr. Katrinka Blazing. Husband diagnosed with hepatitis C last year/doing better.   Past medical history, past surgical history, family history and social history were all reviewed and documented in the EPIC chart. Works for [employer], helps care for her mother also. 3 grown children all doing well. History of a right ankle fracture with fall 2012.   ROS:  A  ROS was performed and pertinent positives and negatives are included in the history.  Exam:  Filed Vitals:   08/30/12 0857  BP: 120/88    General appearance:  Normal Head/Neck:  Normal, without cervical or supraclavicular adenopathy. Thyroid:  Symmetrical, normal in size, without palpable masses or nodularity. Respiratory  Effort:  Normal  Auscultation:  Clear without wheezing or rhonchi Cardiovascular  Auscultation:  Regular rate, without rubs, murmurs or gallops  Edema/varicosities:  Not grossly evident Abdominal  Soft,nontender, without masses, guarding or rebound.  Liver/spleen:  No organomegaly noted  Hernia:  None appreciated  Skin  Inspection:  Grossly normal  Palpation:  Grossly normal Neurologic/psychiatric  Orientation:  Normal with appropriate conversation.  Mood/affect:  Normal  Genitourinary    Breasts: Examined lying and sitting.     Right: Without masses, retractions, discharge or axillary adenopathy.     Left: Without masses, retractions, discharge or axillary adenopathy.   Inguinal/mons:  Normal without inguinal adenopathy  External genitalia:  Normal  BUS/Urethra/Skene's glands:  Normal  Bladder:  Normal  Vagina:  Normal  Cervix/Uterus: Absent  Adnexa/parametria:      Rt: Without masses or tenderness.   Lt: Without masses or tenderness.  Anus and perineum: Normal   Assessment/Plan:  61 y.o. M. WF G3 P3 for annual exam with no complaints.  Normal postmenopausal exam on no ERT Hypertension/hypercholesterolemia/depression/asthma-primary care labs and meds Exposure to hepatitis C  Plan: Hepatitis C, home Hemoccult card given with instructions. DEXA instructed to schedule here. SBE's, instructed to schedule mammogram which is overdue. Encouraged to increase exercise, decrease calories for weight loss. No Pap, new screening guidelines reviewed. Vitamin D 2000 daily encouraged, home safety and fall prevention discussed.    Harrington Challenger [facility], 11:56 AM 08/30/2012

## 2012-09-02 ENCOUNTER — Emergency Department (HOSPITAL_COMMUNITY)
Admission: EM | Admit: 2012-09-02 | Discharge: 2012-09-02 | Disposition: A | Discharge status: Home / Self Care | Payer: 59 | Attending: Emergency Medicine | Admitting: Emergency Medicine

## 2012-09-02 ENCOUNTER — Emergency Department (INDEPENDENT_AMBULATORY_CARE_PROVIDER_SITE_OTHER)
Admission: EM | Admit: 2012-09-02 | Discharge: 2012-09-02 | Disposition: A | Discharge status: Other Acute Inpatient Hospital | Payer: 59 | Source: Home / Self Care | Attending: Family Medicine | Admitting: Family Medicine

## 2012-09-02 ENCOUNTER — Encounter (HOSPITAL_COMMUNITY): Payer: Self-pay

## 2012-09-02 ENCOUNTER — Encounter (HOSPITAL_COMMUNITY): Payer: Self-pay | Admitting: Physical Medicine and Rehabilitation

## 2012-09-02 ENCOUNTER — Emergency Department (HOSPITAL_COMMUNITY): Payer: 59

## 2012-09-02 DIAGNOSIS — K219 Gastro-esophageal reflux disease without esophagitis: Secondary | ICD-10-CM | POA: Insufficient documentation

## 2012-09-02 DIAGNOSIS — Z8249 Family history of ischemic heart disease and other diseases of the circulatory system: Secondary | ICD-10-CM | POA: Insufficient documentation

## 2012-09-02 DIAGNOSIS — I10 Essential (primary) hypertension: Secondary | ICD-10-CM | POA: Insufficient documentation

## 2012-09-02 DIAGNOSIS — K5900 Constipation, unspecified: Secondary | ICD-10-CM | POA: Insufficient documentation

## 2012-09-02 DIAGNOSIS — K625 Hemorrhage of anus and rectum: Secondary | ICD-10-CM

## 2012-09-02 DIAGNOSIS — Z823 Family history of stroke: Secondary | ICD-10-CM | POA: Insufficient documentation

## 2012-09-02 DIAGNOSIS — Z8489 Family history of other specified conditions: Secondary | ICD-10-CM | POA: Insufficient documentation

## 2012-09-02 LAB — CBC WITH DIFFERENTIAL/PLATELET
Basophils Absolute: 0 10*3/uL (ref 0.0–0.1)
HCT: 40.8 % (ref 36.0–46.0)
Hemoglobin: 14.1 g/dL (ref 12.0–15.0)
Lymphocytes Relative: 21 % (ref 12–46)
Monocytes Absolute: 0.9 10*3/uL (ref 0.1–1.0)
Monocytes Relative: 8 % (ref 3–12)
Neutro Abs: 8.1 10*3/uL — ABNORMAL HIGH (ref 1.7–7.7)
Neutrophils Relative %: 71 % (ref 43–77)
RBC: 4.95 MIL/uL (ref 3.87–5.11)
WBC: 11.4 10*3/uL — ABNORMAL HIGH (ref 4.0–10.5)

## 2012-09-02 LAB — OCCULT BLOOD, POC DEVICE: Fecal Occult Bld: POSITIVE

## 2012-09-02 LAB — COMPREHENSIVE METABOLIC PANEL
AST: 24 U/L (ref 0–37)
Alkaline Phosphatase: 68 U/L (ref 39–117)
BUN: 13 mg/dL (ref 6–23)
CO2: 28 mEq/L (ref 19–32)
Chloride: 98 mEq/L (ref 96–112)
Creatinine, Ser: 0.78 mg/dL (ref 0.50–1.10)
GFR calc non Af Amer: 90 mL/min — ABNORMAL LOW (ref >90)
Potassium: 3.3 mEq/L — ABNORMAL LOW (ref 3.5–5.1)
Total Bilirubin: 0.4 mg/dL (ref 0.3–1.2)

## 2012-09-02 MED ORDER — SODIUM CHLORIDE 0.9 % IV BOLUS (SEPSIS)
1000.0000 mL | Freq: Once | INTRAVENOUS | Status: AC
  Administered 2012-09-02: 1000 mL via INTRAVENOUS

## 2012-09-02 MED ORDER — MORPHINE SULFATE 4 MG/ML IJ SOLN
4.0000 mg | Freq: Once | INTRAMUSCULAR | Status: AC
  Administered 2012-09-02: 4 mg via INTRAVENOUS
  Filled 2012-09-02: qty 1

## 2012-09-02 MED ORDER — ONDANSETRON HCL 4 MG/2ML IJ SOLN
4.0000 mg | Freq: Once | INTRAMUSCULAR | Status: AC
  Administered 2012-09-02: 4 mg via INTRAVENOUS
  Filled 2012-09-02: qty 2

## 2012-09-02 NOTE — ED Provider Notes (Signed)
History     CSN: [id number]  Arrival date & time 09/02/12  1257   First MD Initiated Contact with Patient 09/02/12 [phone]      Chief Complaint  Patient presents with  . Rectal Bleeding  . Constipation    (Consider location/radiation/quality/duration/timing/severity/associated sxs/prior treatment) HPI  61 y.o.-year-old female in no acute distress complaining of abdominal pain (epigastric and LUQ 5/10 associated with early satiety) and bloody stool with associated constipation x3 weeks. Last BM x1 week ago, she is not passing gas x3 days. Used 3x suppositories x3 days ago. Used colace yesterday and kayopectate today. Reports subjective fever.  Patient was seen at urgent care earlier in the afternoon DRE reveals no stool in rectal vault and guaiac was positive. Patient has been constipated for 2 weeks with no response to laxatives and enemas. 2x abdominal surgeries remote x20 years. Denies diarrhea hemorrhoids, nausea/vomiting, fever. Last colonoscopy was over 10 years ago with no abnormalities.  Past Medical History  Diagnosis Date  . Hyperlipidemia   . ALLERGIC RHINITIS   . Hypertension   . Acid reflux     Past Surgical History  Procedure Date  . Tubal ligation   . Vesicovaginal fistula closure w/ tah   . Abdominal hysterectomy     Family History  Problem Relation Age of Onset  . Allergies Daughter   . Allergies Son   . Allergies Mother   . Heart disease Mother     bypass surgery  . Hypertension Mother   . Hyperlipidemia Mother   . Heart disease Father     bypass surgery  . Hypertension Father   . Hyperlipidemia Father   . Stroke Father     History  Substance Use Topics  . Smoking status: Never Smoker   . Smokeless tobacco: Not on file  . Alcohol Use: No    OB History    Grav Para Term Preterm Abortions TAB SAB Ect Mult Living   3 3 3       3       Review of Systems  Constitutional: Negative for fever.  Respiratory: Negative for shortness of breath.     Cardiovascular: Negative for chest pain.  Gastrointestinal: Positive for abdominal pain and constipation. Negative for nausea, vomiting and diarrhea.  All other systems reviewed and are negative.    Allergies  Review of patient's allergies indicates no known allergies.  Home Medications   Current Outpatient Rx  Name Route Sig Dispense Refill  . BENAZEPRIL-HYDROCHLOROTHIAZIDE 20-12.5 MG PO TABS Oral Take 1 tablet by mouth Twice daily.    Marland Kitchen CETIRIZINE HCL 10 MG PO TABS Oral Take 10 mg by mouth daily.    . CHOLINE FENOFIBRATE 45 MG PO CPDR Oral Take 45 mg by mouth daily.    . CYMBALTA 60 MG PO CPEP Oral Take 2 tablets by mouth daily.    Marland Kitchen FLUTICASONE PROPIONATE 50 MCG/ACT NA SUSP Each Nare Place 2 sprays into both nostrils daily.    Marland Kitchen KLOR-CON M20 20 MEQ PO TBCR Oral Take 1 tablet by mouth daily.    . MULTIVITAMINS PO CAPS Oral Take 1 capsule by mouth daily.    Marland Kitchen NEXIUM 40 MG PO CPDR Oral Take 1 tablet by mouth daily.    Marland Kitchen SIMVASTATIN 40 MG PO TABS Oral Take 1 tablet by mouth daily.    . VENTOLIN HFA 108 (90 BASE) MCG/ACT IN AERS Inhalation Inhale 2 puffs into the lungs 4 (four) times daily as needed.  BP 122/103  Pulse 106  Temp 98.2 F (36.8 C) (Oral)  Resp 19  SpO2 96%  Physical Exam  Nursing note and vitals reviewed. Constitutional: She is oriented to person, place, and time. She appears well-developed and well-nourished. No distress.  HENT:  Head: Normocephalic.  Mouth/Throat: Oropharynx is clear and moist.  Eyes: Conjunctivae normal and EOM are normal. Pupils are equal, round, and reactive to light.  Cardiovascular: Normal rate.   Pulmonary/Chest: Effort normal and breath sounds normal. No stridor. No respiratory distress. She has no wheezes. She has no rales. She exhibits no tenderness.  Abdominal: Soft. Bowel sounds are normal. She exhibits no distension and no mass. There is no rebound and no guarding.       Tenderness to palpation of the epigastric and left  upper quadrant. No peritoneal signs.  Genitourinary:       Good rectal tone, no hemorrhoids, no stool in the vault.  Musculoskeletal: Normal range of motion.  Neurological: She is alert and oriented to person, place, and time.  Skin: Skin is warm and dry.  Psychiatric: She has a normal mood and affect.    ED Course  Procedures (including critical care time)  Labs Reviewed  CBC WITH DIFFERENTIAL - Abnormal; Notable for the following:    WBC 11.4 (*)     Neutro Abs 8.1 (*)     All other components within normal limits  COMPREHENSIVE METABOLIC PANEL - Abnormal; Notable for the following:    Potassium 3.3 (*)     Glucose, Bld 128 (*)     GFR calc non Af Amer 90 (*)     All other components within normal limits  OCCULT BLOOD, POC DEVICE  OCCULT BLOOD GASTRIC / DUODENUM   Dg Abd Acute W/chest  09/02/2012  *RADIOLOGY REPORT*  Clinical Data: Abdominal pain, rectal bleeding, constipation.  ACUTE ABDOMEN SERIES (ABDOMEN 2 VIEW & CHEST 1 VIEW)  Comparison: IVP 02/05/2009.  Findings: Single view of the chest demonstrates clear lungs and normal heart size.  No pneumothorax or pleural fluid.  Two views of the abdomen show no free intraperitoneal air.  Bowel gas pattern is unremarkable.  No unexpected abdominal calcification.  IMPRESSION: No acute finding chest or abdomen.   Original Report Authenticated By: Bernadene Bell. D'ALESSIO, M.D.      1. Constipation       MDM  61 y.o. -year-old female has had no bowel movement in 7 days with diffuse abdominal discomfort. Guaiac is positive CMP and CBC are unremarkable acute abdominal series shows no free air with unremarkable gas pattern and fecal material spread throughout the colon.    I will advise magnesium citrate and progression to miralax if no relief.   I personally reviewed the imaging tests through PACS system  I reviewed available ER/hospitalization records thought the EMR  Discussed case with attending who agrees with plan and stability  to d/c to home.   Pt verbalized understanding and agrees with care plan. Outpatient follow-up and return precautions given.          Wynetta Emery, PA-C 09/02/12 1808

## 2012-09-02 NOTE — ED Provider Notes (Signed)
Medical screening examination/treatment/procedure(s) were performed by non-physician practitioner and as supervising physician I was immediately available for consultation/collaboration.   Charles B. Bernette Mayers, MD 09/02/12 3244

## 2012-09-02 NOTE — ED Notes (Signed)
Patient transported to X-ray. Unable to gain iv access x 2. Iv team paged.

## 2012-09-02 NOTE — ED Notes (Signed)
Pt presents to department for evaluation of constipation x1 week and rectal bleeding. States difficulty defecating, and intermittently notices bright red blood in stool. Also states abdominal pain. Denies nausea/vomiting. No relief with laxatives at home. She is alert and oriented x4.

## 2012-09-02 NOTE — ED Provider Notes (Signed)
History     CSN: [id number]  Arrival date & time 09/02/12  1117   First MD Initiated Contact with Patient 09/02/12 1128      Chief Complaint  Patient presents with  . Constipation    (Consider location/radiation/quality/duration/timing/severity/associated sxs/prior treatment) Patient is a 61 y.o. female presenting with constipation. The history is provided by the patient.  Constipation  The current episode started more than 2 weeks ago (no response to lax and supp and also noticed blood with straining.). The onset was gradual. The problem has been gradually worsening. The pain is moderate. The stool is described as bloody and hard. Prior unsuccessful therapies include laxatives and enemas. Associated symptoms include abdominal pain. Pertinent negatives include no diarrhea, no hemorrhoids, no nausea and no vomiting. Her past medical history is significant for abdominal surgery.    Past Medical History  Diagnosis Date  . Hyperlipidemia   . ALLERGIC RHINITIS   . Hypertension   . Acid reflux     Past Surgical History  Procedure Date  . Tubal ligation   . Vesicovaginal fistula closure w/ tah   . Abdominal hysterectomy     Family History  Problem Relation Age of Onset  . Allergies Daughter   . Allergies Son   . Allergies Mother   . Heart disease Mother     bypass surgery  . Hypertension Mother   . Hyperlipidemia Mother   . Heart disease Father     bypass surgery  . Hypertension Father   . Hyperlipidemia Father   . Stroke Father     History  Substance Use Topics  . Smoking status: Never Smoker   . Smokeless tobacco: Not on file  . Alcohol Use: No    OB History    Grav Para Term Preterm Abortions TAB SAB Ect Mult Living   3 3 3       3       Review of Systems  Constitutional: Negative.   Gastrointestinal: Positive for abdominal pain, constipation and blood in stool. Negative for nausea, vomiting, diarrhea and hemorrhoids.    Allergies  Review of patient's  allergies indicates no known allergies.  Home Medications   Current Outpatient Rx  Name Route Sig Dispense Refill  . BENAZEPRIL-HYDROCHLOROTHIAZIDE 20-12.5 MG PO TABS Oral Take 1 tablet by mouth Twice daily.    . CHOLINE FENOFIBRATE 45 MG PO CPDR Oral Take 45 mg by mouth daily.    . CYMBALTA 60 MG PO CPEP Oral Take 2 tablets by mouth daily.    Marland Kitchen FLUTICASONE PROPIONATE 50 MCG/ACT NA SUSP Each Nare Place 2 sprays into both nostrils daily.    Marland Kitchen KLOR-CON M20 20 MEQ PO TBCR Oral Take 1 tablet by mouth daily.    Marland Kitchen NEXIUM 40 MG PO CPDR Oral Take 1 tablet by mouth daily.    Marland Kitchen CETIRIZINE HCL 10 MG PO TABS Oral Take 10 mg by mouth daily.    . MULTIVITAMINS PO CAPS Oral Take 1 capsule by mouth daily.    Marland Kitchen SIMVASTATIN 40 MG PO TABS Oral Take 1 tablet by mouth daily.    . VENTOLIN HFA 108 (90 BASE) MCG/ACT IN AERS Inhalation Inhale 2 puffs into the lungs 4 (four) times daily as needed.      BP 118/80  Pulse 116  Temp 99.3 F (37.4 C) (Oral)  Resp 20  SpO2 94%  Physical Exam  Nursing note and vitals reviewed. Constitutional: She appears well-developed and well-nourished.  Abdominal: Soft. Bowel sounds are normal.  She exhibits no distension and no mass. There is tenderness in the suprapubic area and left lower quadrant. There is no rigidity, no rebound and no guarding.  Genitourinary: Rectal exam shows no external hemorrhoid, no fissure, no mass and no tenderness. Guaiac positive stool.    Skin: Skin is warm and dry.    ED Course  Procedures (including critical care time)   Labs Reviewed  OCCULT BLOOD, POC DEVICE   No results found.   No diagnosis found.    MDM  Rectal mucous bloody and guiac pos.        Linna Hoff, MD 09/02/12 [phone]

## 2012-09-02 NOTE — ED Notes (Signed)
C/o she has not had a BM for 1 week.  Passing small amts of hard stool w blood ; c/o she has not had any results w suppositories , OTC laxatives; abdominal discomfort for ~ 3 weeks

## 2012-09-04 ENCOUNTER — Other Ambulatory Visit: Payer: Self-pay | Admitting: Obstetrics and Gynecology

## 2012-09-04 DIAGNOSIS — Z1231 Encounter for screening mammogram for malignant neoplasm of breast: Secondary | ICD-10-CM

## 2012-09-05 ENCOUNTER — Encounter: Payer: Self-pay | Admitting: Internal Medicine

## 2012-09-05 ENCOUNTER — Ambulatory Visit (INDEPENDENT_AMBULATORY_CARE_PROVIDER_SITE_OTHER): Payer: 59 | Admitting: Internal Medicine

## 2012-09-05 VITALS — BP 118/76 | HR 84 | Ht 64.0 in | Wt 177.2 lb

## 2012-09-05 DIAGNOSIS — J309 Allergic rhinitis, unspecified: Secondary | ICD-10-CM

## 2012-09-05 DIAGNOSIS — J0101 Acute recurrent maxillary sinusitis: Secondary | ICD-10-CM

## 2012-09-05 DIAGNOSIS — J0100 Acute maxillary sinusitis, unspecified: Secondary | ICD-10-CM

## 2012-09-05 DIAGNOSIS — J45909 Unspecified asthma, uncomplicated: Secondary | ICD-10-CM

## 2012-09-05 DIAGNOSIS — J302 Other seasonal allergic rhinitis: Secondary | ICD-10-CM

## 2012-09-05 DIAGNOSIS — J3089 Other allergic rhinitis: Secondary | ICD-10-CM

## 2012-09-05 NOTE — Patient Instructions (Addendum)
We will make a vaccine based on your skin test results and the allergy lab will contact you about starting the build-up soon.  You can take your usual allergy medications as you feel you need.

## 2012-09-05 NOTE — Progress Notes (Signed)
08/09/12- 61 yoF never smoker self-referred for allergy evaluation because of recurrent sinus infections in the past 10 years or more.She describes itching of skin and eyes with nasal congestion and maxillary pressure which may hurt her teeth as much as one time per month. She finds that early antibiotic intervention helps. Perennial nasal congestion and rhinitis began around age 61 and she blames environmental allergy. Eyes and throat itch very soon after exposure to cat. Allergy skin testing 20 years ago. She does not remember the results but was not on allergy vaccine. Has had episodes of asthmatic bronchitis but no formal diagnosis of asthma or pneumonia. No history of food intolerance or skin rash. Large local reactions to bumblebee sting. Tonsils out otherwise no ENT surgery. Uses saline nasal spray. Uses Flonase only if she feels she is getting sick. Last prednisone was 3 or 4 days ago. 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 Allergy profile 08/16/2012-total IgE 23.3 with specific elevations for dust mite, cat, dog. She has an outdoor dog. Allergy skin testing: 09/05/12- significant reactions for grass pollens, ragweed, Elmer, house dust/mite, cat. Results were reviewed with her. She says she has tried conservative therapy long enough and wants to try allergy vaccine.  ROS-see HPI Constitutional:   No-   weight loss, night sweats, fevers, chills, fatigue, lassitude. HEENT:   No-  headaches, difficulty swallowing, tooth/dental problems, sore throat,       + sneezing, itching, ear ache, nasal congestion, post nasal drip,  CV:  No-   chest pain, orthopnea, PND, swelling in lower extremities, anasarca,  dizziness, palpitations Resp: No-    shortness of breath with exertion or at rest.              No-   productive cough,  No non-productive cough,  No- coughing up of blood.              No-   change in color of mucus.  No- wheezing.   Skin: No-   rash or lesions. GI:  No-   heartburn, indigestion, abdominal pain, nausea, vomiting, GU:  MS:  No-   joint pain or swelling. . Neuro-     nothing unusual Psych:  No- change in mood or affect. No depression or anxiety.  No memory loss.  OBJ- Physical Exam General- Alert, Oriented, Affect-appropriate, Distress- none acute Skin- rash-none, lesions- none, excoriation- none Lymphadenopathy- none Head- atraumatic            Eyes- Gross vision intact, PERRLA, conjunctivae and secretions clear            Ears- Hearing, canals-normal            Nose- Clear, no-Septal dev, mucus, polyps, erosion, perforation. + Wiping nose            Throat- Mallampati III-IV , mucosa clear , drainage- none, tonsils- atrophic Neck- flexible , trachea midline, no stridor , thyroid nl, carotid no bruit Chest - symmetrical excursion , unlabored           Heart/CV- RRR , no murmur , no gallop  , no rub, nl s1 s2                           -  JVD- none , edema- none, stasis changes- none, varices- none           Lung- clear to P&A, wheeze- none, + cough with deep breath , dullness-none, rub- none           Chest wall-  Abd-  Br/ Gen/ Rectal- Not done, not indicated Extrem- cyanosis- none, clubbing, none, atrophy- none, strength- nl Neuro- grossly intact to observation

## 2012-09-07 NOTE — Assessment & Plan Note (Signed)
Careful discussion of alternatives, risk benefit, realistic expectations and goals, protocol and time course. We discussed potential for serious allergic reaction/anaphylaxis including the possibility of cardiorespiratory collapse or death. She wishes to try allergy vaccine therapy. We will start buildup and follow here

## 2012-09-11 ENCOUNTER — Ambulatory Visit (INDEPENDENT_AMBULATORY_CARE_PROVIDER_SITE_OTHER): Payer: 59

## 2012-09-11 DIAGNOSIS — J309 Allergic rhinitis, unspecified: Secondary | ICD-10-CM

## 2012-09-12 ENCOUNTER — Ambulatory Visit (INDEPENDENT_AMBULATORY_CARE_PROVIDER_SITE_OTHER): Payer: 59

## 2012-09-12 DIAGNOSIS — J309 Allergic rhinitis, unspecified: Secondary | ICD-10-CM

## 2012-09-13 ENCOUNTER — Other Ambulatory Visit: Payer: Self-pay | Admitting: Gynecology

## 2012-09-13 ENCOUNTER — Ambulatory Visit (INDEPENDENT_AMBULATORY_CARE_PROVIDER_SITE_OTHER): Payer: 59

## 2012-09-13 ENCOUNTER — Other Ambulatory Visit: Payer: 59

## 2012-09-13 ENCOUNTER — Encounter: Payer: Self-pay | Admitting: Internal Medicine

## 2012-09-13 DIAGNOSIS — Z780 Asymptomatic menopausal state: Secondary | ICD-10-CM

## 2012-09-13 DIAGNOSIS — Z13820 Encounter for screening for osteoporosis: Secondary | ICD-10-CM

## 2012-09-13 DIAGNOSIS — Z205 Contact with and (suspected) exposure to viral hepatitis: Secondary | ICD-10-CM

## 2012-09-13 LAB — HEPATITIS C ANTIBODY: HCV Ab: NEGATIVE

## 2012-09-19 ENCOUNTER — Ambulatory Visit (INDEPENDENT_AMBULATORY_CARE_PROVIDER_SITE_OTHER): Payer: 59

## 2012-09-19 DIAGNOSIS — J309 Allergic rhinitis, unspecified: Secondary | ICD-10-CM

## 2012-09-20 ENCOUNTER — Ambulatory Visit: Payer: 59

## 2012-09-20 ENCOUNTER — Ambulatory Visit
Admission: RE | Admit: 2012-09-20 | Discharge: 2012-09-20 | Disposition: A | Discharge status: Home / Self Care | Payer: 59 | Source: Ambulatory Visit | Attending: Obstetrics and Gynecology | Admitting: Obstetrics and Gynecology

## 2012-09-20 DIAGNOSIS — Z1231 Encounter for screening mammogram for malignant neoplasm of breast: Secondary | ICD-10-CM

## 2012-09-24 ENCOUNTER — Ambulatory Visit (INDEPENDENT_AMBULATORY_CARE_PROVIDER_SITE_OTHER): Payer: 59

## 2012-09-24 DIAGNOSIS — Z23 Encounter for immunization: Secondary | ICD-10-CM

## 2012-09-24 DIAGNOSIS — J309 Allergic rhinitis, unspecified: Secondary | ICD-10-CM

## 2012-09-26 ENCOUNTER — Ambulatory Visit (INDEPENDENT_AMBULATORY_CARE_PROVIDER_SITE_OTHER): Payer: 59

## 2012-09-26 DIAGNOSIS — Z23 Encounter for immunization: Secondary | ICD-10-CM

## 2012-09-26 DIAGNOSIS — J309 Allergic rhinitis, unspecified: Secondary | ICD-10-CM

## 2012-10-03 ENCOUNTER — Ambulatory Visit (INDEPENDENT_AMBULATORY_CARE_PROVIDER_SITE_OTHER): Payer: 59

## 2012-10-03 DIAGNOSIS — J309 Allergic rhinitis, unspecified: Secondary | ICD-10-CM

## 2012-10-10 ENCOUNTER — Telehealth: Payer: Self-pay | Admitting: Internal Medicine

## 2012-10-10 NOTE — Telephone Encounter (Signed)
Patient is scheduled for an appt with Dr. Leone Payor for 11/13/12.  I will add her to the cancellation list

## 2012-10-16 ENCOUNTER — Encounter (HOSPITAL_BASED_OUTPATIENT_CLINIC_OR_DEPARTMENT_OTHER): Payer: Self-pay | Admitting: Family Medicine

## 2012-10-16 ENCOUNTER — Other Ambulatory Visit: Payer: Self-pay | Admitting: Family Medicine

## 2012-10-16 ENCOUNTER — Other Ambulatory Visit: Payer: 59

## 2012-10-16 ENCOUNTER — Emergency Department (HOSPITAL_BASED_OUTPATIENT_CLINIC_OR_DEPARTMENT_OTHER)
Admission: EM | Admit: 2012-10-16 | Discharge: 2012-10-16 | Disposition: A | Discharge status: Home / Self Care | Payer: 59 | Attending: Emergency Medicine | Admitting: Emergency Medicine

## 2012-10-16 ENCOUNTER — Emergency Department (HOSPITAL_BASED_OUTPATIENT_CLINIC_OR_DEPARTMENT_OTHER): Payer: 59

## 2012-10-16 DIAGNOSIS — Z8669 Personal history of other diseases of the nervous system and sense organs: Secondary | ICD-10-CM | POA: Insufficient documentation

## 2012-10-16 DIAGNOSIS — K5792 Diverticulitis of intestine, part unspecified, without perforation or abscess without bleeding: Secondary | ICD-10-CM

## 2012-10-16 DIAGNOSIS — K5900 Constipation, unspecified: Secondary | ICD-10-CM

## 2012-10-16 DIAGNOSIS — K5732 Diverticulitis of large intestine without perforation or abscess without bleeding: Secondary | ICD-10-CM | POA: Insufficient documentation

## 2012-10-16 DIAGNOSIS — K219 Gastro-esophageal reflux disease without esophagitis: Secondary | ICD-10-CM | POA: Insufficient documentation

## 2012-10-16 DIAGNOSIS — E876 Hypokalemia: Secondary | ICD-10-CM

## 2012-10-16 DIAGNOSIS — Z79899 Other long term (current) drug therapy: Secondary | ICD-10-CM | POA: Insufficient documentation

## 2012-10-16 DIAGNOSIS — R110 Nausea: Secondary | ICD-10-CM | POA: Insufficient documentation

## 2012-10-16 DIAGNOSIS — E860 Dehydration: Secondary | ICD-10-CM | POA: Insufficient documentation

## 2012-10-16 DIAGNOSIS — R109 Unspecified abdominal pain: Secondary | ICD-10-CM

## 2012-10-16 DIAGNOSIS — R197 Diarrhea, unspecified: Secondary | ICD-10-CM | POA: Insufficient documentation

## 2012-10-16 DIAGNOSIS — E785 Hyperlipidemia, unspecified: Secondary | ICD-10-CM | POA: Insufficient documentation

## 2012-10-16 DIAGNOSIS — I10 Essential (primary) hypertension: Secondary | ICD-10-CM | POA: Insufficient documentation

## 2012-10-16 LAB — COMPREHENSIVE METABOLIC PANEL
CO2: 27 mEq/L (ref 19–32)
Calcium: 10 mg/dL (ref 8.4–10.5)
Creatinine, Ser: 1.6 mg/dL — ABNORMAL HIGH (ref 0.50–1.10)
GFR calc Af Amer: 40 mL/min — ABNORMAL LOW (ref >90)
GFR calc non Af Amer: 34 mL/min — ABNORMAL LOW (ref >90)
Glucose, Bld: 114 mg/dL — ABNORMAL HIGH (ref 70–99)
Total Protein: 8.1 g/dL (ref 6.0–8.3)

## 2012-10-16 LAB — CBC WITH DIFFERENTIAL/PLATELET
Hemoglobin: 12.6 g/dL (ref 12.0–15.0)
Lymphocytes Relative: 27 % (ref 12–46)
Lymphs Abs: 2.9 10*3/uL (ref 0.7–4.0)
Monocytes Relative: 8 % (ref 3–12)
Neutro Abs: 6.7 10*3/uL (ref 1.7–7.7)
Neutrophils Relative %: 63 % (ref 43–77)
Platelets: 504 10*3/uL — ABNORMAL HIGH (ref 150–400)
RBC: 4.61 MIL/uL (ref 3.87–5.11)
WBC: 10.6 10*3/uL — ABNORMAL HIGH (ref 4.0–10.5)

## 2012-10-16 MED ORDER — SODIUM CHLORIDE 0.9 % IV BOLUS (SEPSIS)
1000.0000 mL | Freq: Once | INTRAVENOUS | Status: AC
  Administered 2012-10-16: 1000 mL via INTRAVENOUS

## 2012-10-16 MED ORDER — CIPROFLOXACIN IN D5W 400 MG/200ML IV SOLN
400.0000 mg | Freq: Once | INTRAVENOUS | Status: AC
  Administered 2012-10-16: 400 mg via INTRAVENOUS
  Filled 2012-10-16: qty 200

## 2012-10-16 MED ORDER — METRONIDAZOLE 500 MG PO TABS: 500.0000 mg | ORAL_TABLET | Freq: Two times a day (BID) | ORAL | Status: DC

## 2012-10-16 MED ORDER — IOHEXOL 300 MG/ML  SOLN
80.0000 mL | Freq: Once | INTRAMUSCULAR | Status: AC | PRN
  Administered 2012-10-16: 80 mL via INTRAVENOUS

## 2012-10-16 MED ORDER — POTASSIUM CHLORIDE 10 MEQ/100ML IV SOLN
10.0000 meq | Freq: Once | INTRAVENOUS | Status: AC
  Administered 2012-10-16: 10 meq via INTRAVENOUS
  Filled 2012-10-16: qty 100

## 2012-10-16 MED ORDER — POTASSIUM CHLORIDE CRYS ER 20 MEQ PO TBCR: 20.0000 meq | EXTENDED_RELEASE_TABLET | Freq: Two times a day (BID) | ORAL | Status: DC

## 2012-10-16 MED ORDER — IOHEXOL 300 MG/ML  SOLN
50.0000 mL | Freq: Once | INTRAMUSCULAR | Status: AC | PRN
  Administered 2012-10-16: 50 mL via ORAL

## 2012-10-16 MED ORDER — POTASSIUM CHLORIDE CRYS ER 20 MEQ PO TBCR
40.0000 meq | EXTENDED_RELEASE_TABLET | Freq: Once | ORAL | Status: AC
Start: 2012-10-16 — End: 2012-10-16
  Administered 2012-10-16: 40 meq via ORAL
  Filled 2012-10-16: qty 2

## 2012-10-16 MED ORDER — CIPROFLOXACIN HCL 500 MG PO TABS: 500.0000 mg | ORAL_TABLET | Freq: Two times a day (BID) | ORAL | Status: DC

## 2012-10-16 MED ORDER — SODIUM CHLORIDE 0.9 % IV BOLUS (SEPSIS)
1000.0000 mL | Freq: Once | INTRAVENOUS | Status: DC
Start: 2012-10-16 — End: 2012-10-17

## 2012-10-16 MED ORDER — METRONIDAZOLE IN NACL 5-0.79 MG/ML-% IV SOLN
500.0000 mg | Freq: Once | INTRAVENOUS | Status: AC
  Administered 2012-10-16: 500 mg via INTRAVENOUS
  Filled 2012-10-16: qty 100

## 2012-10-16 NOTE — ED Provider Notes (Signed)
History     CSN: [id number]  Arrival date & time 10/16/12  1228   First MD Initiated Contact with Patient 10/16/12 1254      Chief Complaint  Patient presents with  . Abnormal Lab    (Consider location/radiation/quality/duration/timing/severity/associated sxs/prior treatment) HPI Comments: Pt states that she has been having intermittent abdominal pain for the last 2 months with periods of constipation and then diarrhea:pt states that she was seen in the er about 5 weeks ago and was told to take mag citrate which caused diarrhea, but pain never resolved:pt was seen by her pcp yesterday and was going to have a ct scan, but when her kidney function tests came back elevated they send her here to be evaluated  Patient is a 61 y.o. female presenting with abdominal pain. The history is provided by the patient. No language interpreter was used.  Abdominal Pain The primary symptoms of the illness include abdominal pain, nausea and diarrhea. The primary symptoms of the illness do not include fever, vomiting or dysuria. The current episode started more than 2 days ago. The onset of the illness was gradual. The problem has not changed since onset.   Past Medical History  Diagnosis Date  . Hyperlipidemia   . ALLERGIC RHINITIS   . Hypertension   . Acid reflux     Past Surgical History  Procedure Date  . Tubal ligation   . Vesicovaginal fistula closure w/ tah   . Abdominal hysterectomy     Family History  Problem Relation Age of Onset  . Allergies Daughter   . Allergies Son   . Allergies Mother   . Heart disease Mother     bypass surgery  . Hypertension Mother   . Hyperlipidemia Mother   . Heart disease Father     bypass surgery  . Hypertension Father   . Hyperlipidemia Father   . Stroke Father     History  Substance Use Topics  . Smoking status: Never Smoker   . Smokeless tobacco: Not on file  . Alcohol Use: No    OB History    Grav Para Term Preterm Abortions TAB SAB  Ect Mult Living   3 3 3       3       Review of Systems  Constitutional: Negative for fever.  Respiratory: Negative.   Cardiovascular: Negative.   Gastrointestinal: Positive for nausea, abdominal pain and diarrhea. Negative for vomiting.  Genitourinary: Negative for dysuria.    Allergies  Review of patient's allergies indicates no known allergies.  Home Medications   Current Outpatient Rx  Name  Route  Sig  Dispense  Refill  . BENAZEPRIL-HYDROCHLOROTHIAZIDE 20-12.5 MG PO TABS   Oral   Take 1 tablet by mouth Twice daily.         Marland Kitchen CETIRIZINE HCL 10 MG PO TABS   Oral   Take 10 mg by mouth at bedtime.          . CHOLINE FENOFIBRATE 45 MG PO CPDR   Oral   Take 45 mg by mouth every morning.          . CYMBALTA 60 MG PO CPEP   Oral   Take 2 tablets by mouth every morning.          Marland Kitchen KLOR-CON M20 20 MEQ PO TBCR   Oral   Take 1 tablet by mouth daily.         . MULTIVITAMINS PO CAPS   Oral   Take  1 capsule by mouth every morning.          Marland Kitchen NEXIUM 40 MG PO CPDR   Oral   Take 1 tablet by mouth daily.         Marland Kitchen SIMVASTATIN 40 MG PO TABS   Oral   Take 1 tablet by mouth at bedtime.          . VENTOLIN HFA 108 (90 BASE) MCG/ACT IN AERS   Inhalation   Inhale 2 puffs into the lungs 4 (four) times daily as needed. For wheezing           BP 63/27  Pulse 108  Temp 97.5 F (36.4 C) (Oral)  Resp 18  SpO2 96%  Physical Exam  Nursing note and vitals reviewed. Constitutional: She is oriented to person, place, and time. She appears well-developed and well-nourished.  HENT:  Head: Normocephalic and atraumatic.  Eyes: Conjunctivae normal and EOM are normal.  Neck: Neck supple.  Cardiovascular: Normal rate and regular rhythm.   Pulmonary/Chest: Effort normal and breath sounds normal.  Abdominal: Soft. Bowel sounds are normal. There is tenderness in the left lower quadrant.  Musculoskeletal: Normal range of motion.  Neurological: She is alert and  oriented to person, place, and time.  Skin: Skin is warm and dry.    ED Course  Procedures (including critical care time)  Labs Reviewed  COMPREHENSIVE METABOLIC PANEL - Abnormal; Notable for the following:    Potassium 2.9 (*)     Glucose, Bld 114 (*)     BUN 30 (*)     Creatinine, Ser 1.60 (*)     GFR calc non Af Amer 34 (*)     GFR calc Af Amer 40 (*)     All other components within normal limits  CBC WITH DIFFERENTIAL - Abnormal; Notable for the following:    WBC 10.6 (*)     Platelets 504 (*)     All other components within normal limits  LIPASE, BLOOD  URINALYSIS, ROUTINE W REFLEX MICROSCOPIC   Ct Abdomen Pelvis W Contrast  10/16/2012  *RADIOLOGY REPORT*  Clinical Data: Epigastric pain, diarrhea  CT ABDOMEN AND PELVIS WITH CONTRAST  Technique:  Multidetector CT imaging of the abdomen and pelvis was performed following the standard protocol during bolus administration of intravenous contrast.  Contrast: 80 ml Omnipaque-300 IV  Comparison: [facility] unenhanced CT abdomen/pelvis dated 04/17/2007  Findings: Lung bases are essentially clear.  Moderate hiatal hernia.  4.3 x 4.1 cm cyst in the posterior segment right hepatic lobe (series 2/image 25).  Spleen, pancreas, and adrenal glands within normal limits.  Gallbladder is unremarkable.  No intrahepatic or extrahepatic ductal dilatation.  Tiny probable right renal cyst (series 7/image 19).  The left kidney is unremarkable.  No hydronephrosis.  No evidence of bowel obstruction.  Normal appendix.  Sigmoid diverticulosis, with associated wall thickening/pericolonic inflammatory changes (series 2image 76), compatible with sigmoid diverticulitis.  No drainable fluid collection/abscess.  No free air.  Atherosclerotic calcifications of the abdominal aorta and branch vessels.  No abdominopelvic ascites.  No suspicious abdominopelvic lymphadenopathy.  Status post hysterectomy.  Bilateral ovaries are unremarkable.  Bladder is underdistended.   Degenerative changes of the visualized thoracolumbar spine.  IMPRESSION: Sigmoid diverticulitis.  No drainable fluid collection or abscess. No free air.  Consider follow-up colonoscopy after resolution of acute symptoms.  Moderate hiatal hernia.   Original Report Authenticated By: Charline Bills, M.D.      1. Diverticulitis   2. Dehydration   3.  Hypokalemia       MDM  Pt appeared to be dizzy and was orthostatic on initially:after 3 liters of fluid pt is feeling better:pt able to ambulate around the department without any problem:pt to have labs rechecked in 2 days with ZOX:[id number] replaced here:discussed case with Dr. Cathie Beams, NP 10/16/12 2036

## 2012-10-16 NOTE — ED Notes (Signed)
Patient walked with stand by assistance to the restroom.  Patient reports she "feels much better".

## 2012-10-16 NOTE — ED Notes (Signed)
Orthostatic VS as noted; checked twice without change.  RN notified.

## 2012-10-16 NOTE — ED Notes (Signed)
Pt sts her PCP sent her here due to labs. Pt sts PCP said she needs IV fluids due to kidney problem. Pt was scheduled to have CT but unable to get it due to abnormal lab.

## 2012-10-17 NOTE — ED Provider Notes (Signed)
Medical screening examination/treatment/procedure(s) were performed by non-physician practitioner and as supervising physician I was immediately available for consultation/collaboration.    Nelia Shi, MD 10/17/12 [id number]

## 2012-10-23 ENCOUNTER — Ambulatory Visit (INDEPENDENT_AMBULATORY_CARE_PROVIDER_SITE_OTHER): Payer: 59

## 2012-10-23 DIAGNOSIS — J309 Allergic rhinitis, unspecified: Secondary | ICD-10-CM

## 2012-10-26 ENCOUNTER — Ambulatory Visit (INDEPENDENT_AMBULATORY_CARE_PROVIDER_SITE_OTHER): Payer: 59

## 2012-10-26 DIAGNOSIS — J309 Allergic rhinitis, unspecified: Secondary | ICD-10-CM

## 2012-11-06 ENCOUNTER — Ambulatory Visit: Payer: 59 | Admitting: Internal Medicine

## 2012-11-13 ENCOUNTER — Ambulatory Visit: Payer: 59 | Admitting: Internal Medicine

## 2012-11-14 ENCOUNTER — Ambulatory Visit (INDEPENDENT_AMBULATORY_CARE_PROVIDER_SITE_OTHER): Payer: 59

## 2012-11-14 DIAGNOSIS — J309 Allergic rhinitis, unspecified: Secondary | ICD-10-CM

## 2012-12-04 ENCOUNTER — Encounter: Payer: Self-pay | Admitting: Internal Medicine

## 2013-01-01 ENCOUNTER — Encounter: Payer: Self-pay | Admitting: Internal Medicine

## 2013-01-01 ENCOUNTER — Ambulatory Visit (INDEPENDENT_AMBULATORY_CARE_PROVIDER_SITE_OTHER): Payer: 59 | Admitting: Internal Medicine

## 2013-01-01 ENCOUNTER — Ambulatory Visit (INDEPENDENT_AMBULATORY_CARE_PROVIDER_SITE_OTHER): Payer: 59

## 2013-01-01 VITALS — BP 124/86 | HR 84 | Ht 63.0 in | Wt 171.4 lb

## 2013-01-01 DIAGNOSIS — K5732 Diverticulitis of large intestine without perforation or abscess without bleeding: Secondary | ICD-10-CM

## 2013-01-01 DIAGNOSIS — Z1211 Encounter for screening for malignant neoplasm of colon: Secondary | ICD-10-CM

## 2013-01-01 DIAGNOSIS — J309 Allergic rhinitis, unspecified: Secondary | ICD-10-CM

## 2013-01-01 MED ORDER — SOD PICOSULFATE-MAG OX-CIT ACD 10-3.5-12 MG-GM-GM PO PACK: 1.0000 | PACK | Freq: Once | ORAL | Status: DC

## 2013-01-01 MED ORDER — NA SULFATE-K SULFATE-MG SULF 17.5-3.13-1.6 GM/177ML PO SOLN: ORAL | Status: DC

## 2013-01-01 NOTE — Progress Notes (Signed)
Subjective:    Patient ID: Debbie [name], female    DOB: 01/08/1951, 62 y.o.   MRN: [id number]  she goes by Debbie [name] Referred by: Laurann Montana M.D.  HPI This is a pleasant 62 year old married woman I know from previous GI workup for GERD with esophageal stricture. She also had a screening colonoscopy in 2003. She developed left lower quadrant pain and constipation as well as dehydration in November, she went to the emergency department where she had a CT scan confirming sigmoid diverticulitis. She took a course of antibiotics and that has resolved. She does suffer with some constipation that is improved. She has tried to increase fiber but cannot actually says she's on high fiber diet or not. She was told she needed a colonoscopy because of the diverticulitis. He has heartburn about every 2 weeks. But otherwise thinks her reflux is doing well. No particular problems there. She has had an esophageal dilation in the past but no current dysphagia.  No Known Allergies Outpatient Prescriptions Prior to Visit  Medication Sig Dispense Refill  . benazepril-hydrochlorthiazide (LOTENSIN HCT) 20-12.5 MG per tablet Take 1 tablet by mouth Twice daily.      . cetirizine (ZYRTEC) 10 MG tablet Take 10 mg by mouth at bedtime.       . Choline Fenofibrate (TRILIPIX) 45 MG capsule Take 45 mg by mouth every morning.       . CYMBALTA 60 MG capsule Take 2 tablets by mouth every morning.       Marland Kitchen KLOR-CON M20 20 MEQ tablet Take 1 tablet by mouth daily.      . Multiple Vitamin (MULTIVITAMIN) capsule Take 1 capsule by mouth every morning.       Marland Kitchen NEXIUM 40 MG capsule Take 1 tablet by mouth daily.      . potassium chloride SA (K-DUR,KLOR-CON) 20 MEQ tablet Take 1 tablet (20 mEq total) by mouth 2 (two) times daily.  10 tablet  0  . simvastatin (ZOCOR) 40 MG tablet Take 1 tablet by mouth at bedtime.       Marland Kitchen     0  .     0  .        Last reviewed on 01/01/2013  3:12 PM by Iva Boop, MD Past Medical History  Diagnosis  Date  . Hyperlipidemia   . ALLERGIC RHINITIS   . Hypertension   . Acid reflux   . Esophageal stricture   . IBS (irritable bowel syndrome)   . Depression   . Hiatal hernia     with  Schatzki's Ring  . Diverticulitis   . Kidney stones    Past Surgical History  Procedure Date  . Tubal ligation   . Vesicovaginal fistula closure w/ tah   . Abdominal hysterectomy   . Esophagogastroduodenoscopy 09/14/05    multiple  . Colonoscopy 01/15/03  . Ankle fracture surgery 2012    right  . Tonsillectomy    History   Social History  . Marital Status: Married    Spouse Name: N/A    Number of Children: 3  .     Occupational History  . Customer Service @ [employer]   . [employer]    Social History Main Topics  . Smoking status: Never Smoker   . Smokeless tobacco: Never Used  . Alcohol Use: No  . Drug Use: No  .       Family History  Problem Relation Age of Onset  . Allergies Daughter   .  Allergies Son   . Allergies Mother   . Heart disease Mother     bypass surgery  . Hypertension Mother   . Hyperlipidemia Mother   . Heart disease Father     bypass surgery  . Hypertension Father   . Hyperlipidemia Father   . Stroke Father   . Diabetes Paternal Grandmother     Review of Systems This is positive for allergies, back pain, fatigue, headaches and some pedal edema. All other review of systems negative or as per history of present illness.    Objective:   Physical Exam General:  Well-developed, well-nourished and in no acute distress Eyes:  anicteric. ENT:   Mouth and posterior pharynx free of lesions.  Neck:   supple w/o thyromegaly or mass.  Lungs: Clear to auscultation bilaterally. Heart:  S1S2, no rubs, murmurs, gallops. Abdomen:  soft, non-tender, no hepatosplenomegaly, hernia, or mass and BS+.  Rectal: Deferred Lymph:  no cervical or supraclavicular adenopathy. Extremities:   no edema Skin   no rash. Neuro:  A&O x 3.  Psych:  appropriate mood and   Affect.   Data Reviewed: CT scan including images from November 2013, emergency room department visit notes. Primary care notes. Lab Results  Component Value Date   WBC 10.6* 10/16/2012   HGB 12.6 10/16/2012   HCT 37.2 10/16/2012   MCV 80.7 10/16/2012   PLT 504* 10/16/2012      Assessment & Plan:   1. Diverticulitis of colon (without mention of hemorrhage) - this is resolved. It is incredibly rare that underlying neoplasm or problem causes this.   2. Special screening for malignant neoplasms, colon - last colonoscopy 10 years ago February 2014. We'll schedule repeat screening colonoscopy.    1. In addition high fiber diet is recommended a handout was given 2. She will continue PPI for GERD and is working well The risks and benefits as well as alternatives of endoscopic procedure(s) have been discussed and reviewed. All questions answered. The patient agrees to proceed  CC: Cala Bradford, MD

## 2013-01-01 NOTE — Patient Instructions (Addendum)
You have been scheduled for a colonoscopy with propofol. Please follow written instructions given to you at your visit today.  Please use the prepopik sample kit we gave you today. If you use inhalers (even only as needed) or a CPAP machine, please bring them with you on the day of your procedure.  Thank you for choosing me and [facility].  Iva Boop, M.D., [facility]

## 2013-01-08 ENCOUNTER — Ambulatory Visit: Payer: 59

## 2013-01-11 ENCOUNTER — Encounter: Payer: Self-pay | Admitting: Internal Medicine

## 2013-01-11 ENCOUNTER — Ambulatory Visit (AMBULATORY_SURGERY_CENTER): Payer: 59 | Admitting: Internal Medicine

## 2013-01-11 VITALS — BP 122/71 | HR 66 | Temp 95.9°F | Resp 12 | Ht 63.0 in | Wt 171.0 lb

## 2013-01-11 DIAGNOSIS — Z1211 Encounter for screening for malignant neoplasm of colon: Secondary | ICD-10-CM

## 2013-01-11 DIAGNOSIS — K5730 Diverticulosis of large intestine without perforation or abscess without bleeding: Secondary | ICD-10-CM

## 2013-01-11 MED ORDER — SODIUM CHLORIDE 0.9 % IV SOLN: 500.0000 mL | INTRAVENOUS | Status: DC

## 2013-01-11 NOTE — Progress Notes (Signed)
No egg or soy allergy. ewm 

## 2013-01-11 NOTE — Op Note (Signed)
[facility] [address] [place], [address]   COLONOSCOPY PROCEDURE REPORT  PATIENT: Debbie [name], Debbie [name]  MR#: [id number] BIRTHDATE: 1951/01/15 , 61  yrs. old GENDER: Female ENDOSCOPIST: Iva Boop, MD, [facility] PROCEDURE DATE:  01/11/2013 PROCEDURE:   Colonoscopy ASA CLASS:   Class II INDICATIONS:average risk screening. MEDICATIONS: propofol (Diprivan) 200mg  IV, These medications were titrated to patient response per physician's verbal order, and MAC sedation, administered by CRNA  DESCRIPTION OF PROCEDURE:   After the risks benefits and alternatives of the procedure were thoroughly explained, informed consent was obtained.  A digital rectal exam revealed no abnormalities of the rectum.   The LB [id number]  endoscope was introduced through the anus and advanced to the cecum, which was identified by both the appendix and ileocecal valve. No adverse events experienced.   The quality of the prep was Prepopik adequate The instrument was then slowly withdrawn as the colon was fully examined.      COLON FINDINGS: Severe diverticulosis was noted in the sigmoid colon.   The colon mucosa was otherwise normal.  Retroflexed views revealed no abnormalities. The time to cecum=3 minutes 16 seconds. Withdrawal time=14 minutes 16 seconds.  The scope was withdrawn and the procedure completed. COMPLICATIONS: There were no complications.  ENDOSCOPIC IMPRESSION: 1.   Severe diverticulosis was noted in the sigmoid colon 2.   The colon mucosa was otherwise normal -adequate prep  RECOMMENDATIONS: Repeat Colonscopy in 10 years.(2024)   eSigned:  Iva Boop, MD, [facility] 01/11/2013 9:35 AM   cc: Laurann Montana, MD and The Patient

## 2013-01-11 NOTE — Patient Instructions (Addendum)
No polyps seen. You do have diverticulosis as we knew but no diverticulitis.  Next routine colonoscopy in 10 years - 2024.  Please see me if needed before then.  Thank you for choosing me and [facility].  Iva Boop, MD, FACG   YOU HAD AN ENDOSCOPIC PROCEDURE TODAY AT THE [facility]: Refer to the procedure report that was given to you for any specific questions about what was found during the examination.  If the procedure report does not answer your questions, please call your gastroenterologist to clarify.  If you requested that your care partner not be given the details of your procedure findings, then the procedure report has been included in a sealed envelope for you to review at your convenience later.  YOU SHOULD EXPECT: Some feelings of bloating in the abdomen. Passage of more gas than usual.  Walking can help get rid of the air that was put into your GI tract during the procedure and reduce the bloating. If you had a lower endoscopy (such as a colonoscopy or flexible sigmoidoscopy) you may notice spotting of blood in your stool or on the toilet paper. If you underwent a bowel prep for your procedure, then you may not have a normal bowel movement for a few days.  DIET: Your first meal following the procedure should be a light meal and then it is ok to progress to your normal diet.  A half-sandwich or bowl of soup is an example of a good first meal.  Heavy or fried foods are harder to digest and may make you feel nauseous or bloated.  Likewise meals heavy in dairy and vegetables can cause extra gas to form and this can also increase the bloating.  Drink plenty of fluids but you should avoid alcoholic beverages for 24 hours.  ACTIVITY: Your care partner should take you home directly after the procedure.  You should plan to take it easy, moving slowly for the rest of the day.  You can resume normal activity the day after the procedure however you should NOT  DRIVE or use heavy machinery for 24 hours (because of the sedation medicines used during the test).    SYMPTOMS TO REPORT IMMEDIATELY: A gastroenterologist can be reached at any hour.  During normal business hours, 8:30 AM to 5:00 PM Monday through Friday, call [phone].  After hours and on weekends, please call the GI answering service at [phone] who will take a message and have the physician on call contact you.   Following lower endoscopy (colonoscopy or flexible sigmoidoscopy):  Excessive amounts of blood in the stool  Significant tenderness or worsening of abdominal pains  Swelling of the abdomen that is new, acute  Fever of 100F or higher   FOLLOW UP: If any biopsies were taken you will be contacted by phone or by letter within the next 1-3 weeks.  Call your gastroenterologist if you have not heard about the biopsies in 3 weeks.  Our staff will call the home number listed on your records the next business day following your procedure to check on you and address any questions or concerns that you may have at that time regarding the information given to you following your procedure. This is a courtesy call and so if there is no answer at the home number and we have not heard from you through the emergency physician on call, we will assume that you have returned to your regular daily activities without incident.  SIGNATURES/CONFIDENTIALITY:  You and/or your care partner have signed paperwork which will be entered into your electronic medical record.  These signatures attest to the fact that that the information above on your After Visit Summary has been reviewed and is understood.  Full responsibility of the confidentiality of this discharge information lies with you and/or your care-partner.   Resume medications. Information given on diverticulosis with discharge instructions.

## 2013-01-11 NOTE — Progress Notes (Signed)
Patient did not experience any of the following events: a burn prior to discharge; a fall within the facility; wrong site/side/patient/procedure/implant event; or a hospital transfer or hospital admission upon discharge from the facility. (G8907) Patient did not have preoperative order for IV antibiotic SSI prophylaxis. (G8918)  

## 2013-01-11 NOTE — Progress Notes (Signed)
Report to pacu RN, vss, bbs=clear incremental doses given propofol, Careers adviser

## 2013-01-14 ENCOUNTER — Telehealth: Payer: Self-pay | Admitting: *Deleted

## 2013-01-14 NOTE — Telephone Encounter (Signed)
Left message to call office if questions or concerns. 

## 2013-01-22 ENCOUNTER — Ambulatory Visit (INDEPENDENT_AMBULATORY_CARE_PROVIDER_SITE_OTHER): Payer: 59

## 2013-01-22 DIAGNOSIS — J309 Allergic rhinitis, unspecified: Secondary | ICD-10-CM

## 2013-01-24 ENCOUNTER — Ambulatory Visit (INDEPENDENT_AMBULATORY_CARE_PROVIDER_SITE_OTHER): Payer: 59

## 2013-01-24 DIAGNOSIS — J309 Allergic rhinitis, unspecified: Secondary | ICD-10-CM

## 2013-02-20 ENCOUNTER — Ambulatory Visit (INDEPENDENT_AMBULATORY_CARE_PROVIDER_SITE_OTHER): Payer: 59

## 2013-02-20 DIAGNOSIS — J309 Allergic rhinitis, unspecified: Secondary | ICD-10-CM

## 2013-03-26 ENCOUNTER — Ambulatory Visit (INDEPENDENT_AMBULATORY_CARE_PROVIDER_SITE_OTHER): Payer: 59

## 2013-03-26 DIAGNOSIS — J309 Allergic rhinitis, unspecified: Secondary | ICD-10-CM

## 2013-04-22 ENCOUNTER — Encounter: Payer: Self-pay | Admitting: Internal Medicine

## 2013-06-07 ENCOUNTER — Emergency Department (HOSPITAL_COMMUNITY)
Admission: EM | Admit: 2013-06-07 | Discharge: 2013-06-07 | Disposition: A | Discharge status: Home / Self Care | Payer: 59 | Source: Home / Self Care | Attending: Emergency Medicine | Admitting: Emergency Medicine

## 2013-06-07 ENCOUNTER — Encounter (HOSPITAL_COMMUNITY): Payer: Self-pay | Admitting: Emergency Medicine

## 2013-06-07 DIAGNOSIS — J019 Acute sinusitis, unspecified: Secondary | ICD-10-CM

## 2013-06-07 DIAGNOSIS — B880 Other acariasis: Secondary | ICD-10-CM

## 2013-06-07 MED ORDER — LEVOFLOXACIN 500 MG PO TABS: 500.0000 mg | ORAL_TABLET | Freq: Every day | ORAL | Status: DC

## 2013-06-07 MED ORDER — TRIAMCINOLONE ACETONIDE 0.1 % EX CREA: TOPICAL_CREAM | Freq: Three times a day (TID) | CUTANEOUS | Status: DC

## 2013-06-07 NOTE — ED Provider Notes (Signed)
Chief Complaint:   Chief Complaint  Patient presents with  . Rash  . URI    History of Present Illness:   Debbie [name] is a 62 year old female who comes in today with complaints of a skin rash and upper respiratory symptoms.  1. Rash: This is been going on since Monday. She has multiple pruritic papules on both legs and her right elbow. She denies any obvious exposures especially to poison ivy, other contact allergens or antigens, obvious insect bites, tick bites, fever, chills, myalgias, or headache. She's had no difficulty breathing or swelling of lips, tongue, or throat.  2. Nasal congestion: She has had a two-day history of left facial pain, headache, nasal congestion with bloody, yellow-green drainage, postnasal drip, sore throat, and cough. She denies fever, chills, or GI symptoms. She rates her pain as an 8/10 in intensity. She does have a history of sinus infections in the past.  Review of Systems:  Other than noted above, the patient denies any of the following symptoms: Systemic:  No fevers, chills, sweats, weight loss or gain, fatigue, or tiredness. Eye:  No redness or discharge. ENT:  No ear pain, drainage, headache, nasal congestion, drainage, sinus pressure, difficulty swallowing, or sore throat. Neck:  No neck pain or swollen glands. Lungs:  No cough, sputum production, hemoptysis, wheezing, chest tightness, shortness of breath or chest pain. GI:  No abdominal pain, nausea, vomiting or diarrhea.  PMFSH:  Past medical history, family history, social history, meds, and allergies were reviewed.   Physical Exam:   Vital signs:  BP 143/87  Pulse 92  Temp(Src) 98.4 F (36.9 C) (Oral)  Resp 16  SpO2 99% General:  Alert and oriented.  In no distress.  Skin warm and dry. Eye:  No conjunctival injection or drainage. Lids were normal. ENT:  TMs and canals were normal, without erythema or inflammation.  Nasal mucosa was clear and uncongested, without drainage.  Mucous membranes  were moist.  Pharynx was clear with no exudate or drainage.  There were no oral ulcerations or lesions. Neck:  Supple, no adenopathy, tenderness or mass. Lungs:  No respiratory distress.  Lungs were clear to auscultation, without wheezes, rales or rhonchi.  Breath sounds were clear and equal bilaterally.  Heart:  Regular rhythm, without gallops, murmers or rubs. Skin:  Clear, warm, and dry,  she has scattered erythematous papules on both legs and one on the right elbow.    Assessment:  The primary encounter diagnosis was Chigger bites. A diagnosis of Acute sinusitis was also pertinent to this visit.  Plan:   1.  The following meds were prescribed:   Discharge Medication List as of 06/07/2013  4:36 PM    START taking these medications   Details  levofloxacin (LEVAQUIN) 500 MG tablet Take 1 tablet (500 mg total) by mouth daily., Starting 06/07/2013, Until Discontinued, Normal    triamcinolone cream (KENALOG) 0.1 % Apply topically 3 (three) times daily., Starting 06/07/2013, Until Discontinued, Normal       2.  The patient was instructed in symptomatic care and handouts were given. 3.  The patient was told to return if becoming worse in any way, if no better in 3 or 4 days, and given some red flag symptoms such as fever or worsening pain that would indicate earlier return. 4.  Follow up  Here as necessary.        Reuben Likes, MD 06/07/13 [phone]

## 2013-06-07 NOTE — ED Notes (Signed)
Multiple complaints: patient complains of scattered bites to lower extremities, bites itch, and one on right elbow.  Pattern of sites is irregular grouping of bumps, some scabbed Patient also concerned for sinus congestion and pain, left side of face hurts, and teeth hurt.

## 2013-09-03 ENCOUNTER — Encounter: Payer: Self-pay | Admitting: Women's Health

## 2013-09-03 ENCOUNTER — Ambulatory Visit (INDEPENDENT_AMBULATORY_CARE_PROVIDER_SITE_OTHER): Payer: 59 | Admitting: Women's Health

## 2013-09-03 VITALS — Ht 63.5 in | Wt 174.8 lb

## 2013-09-03 DIAGNOSIS — Z01419 Encounter for gynecological examination (general) (routine) without abnormal findings: Secondary | ICD-10-CM

## 2013-09-03 NOTE — Progress Notes (Signed)
Debbie [name] 1950-12-06 [id number]    History:    The patient presents for annual exam.  TAH for DUE/ fibroids on no HRT. Hypertension/hypercholesterolemia/reflux/depression primary care manages. History of diverticulosis last colonoscopy 01/2013. Normal Pap and mammogram history. Normal DEXA 09/2012 T score AP spine 2.1 hip average 0.7. Husband hepatitis C in remission patient -2013.   Past medical history, past surgical history, family history and social history were all reviewed and documented in the EPIC chart. Works for [employer]. Father died of heart disease. One brother with heart disease. Mother hypertension/hypercholesterolemia and allergies. 3 children all doing well.   ROS:  A  ROS was performed and pertinent positives and negatives are included in the history.  Exam:  There were no vitals filed for this visit.  General appearance:  Normal Head/Neck:  Normal, without cervical or supraclavicular adenopathy. Thyroid:  Symmetrical, normal in size, without palpable masses or nodularity. Respiratory  Effort:  Normal  Auscultation:  Clear without wheezing or rhonchi Cardiovascular  Auscultation:  Regular rate, without rubs, murmurs or gallops  Edema/varicosities:  Not grossly evident Abdominal  Soft,nontender, without masses, guarding or rebound.  Liver/spleen:  No organomegaly noted  Hernia:  None appreciated  Skin  Inspection:  Grossly normal  Palpation:  Grossly normal Neurologic/psychiatric  Orientation:  Normal with appropriate conversation.  Mood/affect:  Normal  Genitourinary    Breasts: Examined lying and sitting.     Right: Without masses, retractions, discharge or axillary adenopathy.     Left: Without masses, retractions, discharge or axillary adenopathy.   Inguinal/mons:  Normal without inguinal adenopathy  External genitalia:  Normal  BUS/Urethra/Skene's glands:  Normal  Bladder:  Normal  Vagina:  +1 cystocele/rectocele  Cervix:  Absent  Uterus:   Absent  Adnexa/parametria:     Rt: Without masses or tenderness.   Lt: Without masses or tenderness.  Anus and perineum: Normal  Digital rectal exam: Normal sphincter tone without palpated masses or tenderness  Assessment/Plan:  61 y.o. MWF G3 P3 for annual exam with occasional vaginal itching and occasional left lower quadrant pain from diverticulosis..    Hypertension/hypercholesterolemia/reflux/depression-primary care manages labs and meds Diverticulosis Asymptomatic +cystocele/rectocele  Plan: SBE's, continue annual mammogram, calcium rich diet, vitamin D 2000 daily encouraged. Continue walking most days of the week and increase exercise is able, decrease calories for weight loss.    Debbie [name] WHNP, 10:02 AM 09/03/2013

## 2014-01-20 ENCOUNTER — Ambulatory Visit (INDEPENDENT_AMBULATORY_CARE_PROVIDER_SITE_OTHER): Payer: 59

## 2014-01-20 ENCOUNTER — Encounter: Payer: Self-pay | Admitting: Podiatry

## 2014-01-20 ENCOUNTER — Ambulatory Visit (INDEPENDENT_AMBULATORY_CARE_PROVIDER_SITE_OTHER): Payer: 59 | Admitting: Podiatry

## 2014-01-20 VITALS — BP 117/88 | HR 76 | Resp 18

## 2014-01-20 DIAGNOSIS — R52 Pain, unspecified: Secondary | ICD-10-CM

## 2014-01-20 DIAGNOSIS — M779 Enthesopathy, unspecified: Secondary | ICD-10-CM

## 2014-01-20 NOTE — Patient Instructions (Signed)
Schedule a physical examination with your primary care Dr. For ankle pain use over-the-counter Tylenol as needed.

## 2014-01-20 NOTE — Progress Notes (Signed)
   Subjective:    Patient ID: Debbie [name], female    DOB: [birth year], 63 y.o.   MRN: [id number]  HPI both my ankles hurt and the right ankle was fractured about 3 years ago and burns and throbs and sore and tender and hurts to walk on them and some swelling on the right and elevate my feet and sharp pain across toes.  The symptoms are somewhat intermittent describing a throbbing pain and occur 2-3 times a week lasting for several hours generally aggravated with weightbearing relieved somewhat with rest., Since approximately 2010. In spite of the right ankle fracture the right ankle was no more symptomatic than the left.    Review of Systems  Gastrointestinal: Positive for abdominal pain and constipation.       Bloating   Allergic/Immunologic: Positive for environmental allergies.  All other systems reviewed and are negative.       Objective:   Physical Exam  Orientated x3 female  Vascular: The DP is are 1/4 bilaterally. PTs are 2/4 bilaterally. Capillary fill is immediate bilaterally  Neurological: Knee and ankle reflexes strong and reactive bilaterally  Dermatological: Texture and turgor within normal limits  Musculoskeletal: Medium arch contour was stable gait noted. Patient is able to heel off unilaterally bilaterally, however, with more difficulty on the right than the left. There is a full range of ankle motion, subtalar joint motion midtarsal joint motion bilaterally. There are no areas of palpable tenderness. Range of motion and palpation elicits no palpable lesions  X-ray report weightbearing right ankle  Intact bony structures done a fracture and/or dislocation noted. Multiple internal fixation devices noted in the distal tibia and fibula. The ankle mortise is visible and appears within normal limits.  Radiographic impression no acute bony abnormality noted right ankle. Retained internal fixation as noted above fracture repair  X-ray report weightbearing left ankle     Intact bony structure without a fracture and/or dislocation noted. Ankle mortise is unremarkable.  Radiographic impression: No acute bony abnormality noted left ankle        Assessment & Plan:   Assessment: Undetermined cause of intermittent ankle pain Possible neuropathy versus intermittent arthralgia/capsulitis  Plan: I advised patient and I cannot determine the cause of her intermittent ankle pain. I recommended that she contact her primary care physician her in terms for complete physical exam, to rule out any possibility of diabetes. Because her symptoms demonstrate no obvious swelling or obvious arthritic inflammation I deferred on arthritic evaluation at this time. As her symptoms are somewhat intermittent and she does have multiple GI issues I recommended an occasional oral Tylenol.  Reappoint at patient's request.

## 2014-01-21 ENCOUNTER — Encounter: Payer: Self-pay | Admitting: Podiatry

## 2014-03-05 ENCOUNTER — Ambulatory Visit
Admission: RE | Admit: 2014-03-05 | Discharge: 2014-03-05 | Disposition: A | Discharge status: Home / Self Care | Payer: 59 | Source: Ambulatory Visit | Attending: Family Medicine | Admitting: Family Medicine

## 2014-03-05 ENCOUNTER — Other Ambulatory Visit: Payer: Self-pay | Admitting: Family Medicine

## 2014-03-05 DIAGNOSIS — K5909 Other constipation: Secondary | ICD-10-CM

## 2014-03-05 MED ORDER — IOHEXOL 300 MG/ML  SOLN
100.0000 mL | Freq: Once | INTRAMUSCULAR | Status: AC | PRN
  Administered 2014-03-05: 100 mL via INTRAVENOUS

## 2014-03-05 MED ORDER — IOHEXOL 300 MG/ML  SOLN
40.0000 mL | Freq: Once | INTRAMUSCULAR | Status: AC | PRN
  Administered 2014-03-05: 40 mL via ORAL

## 2014-04-10 ENCOUNTER — Telehealth: Payer: Self-pay | Admitting: Internal Medicine

## 2014-04-10 NOTE — Telephone Encounter (Signed)
Noted  

## 2014-04-10 NOTE — Telephone Encounter (Signed)
Please close note once you've read it.

## 2014-04-10 NOTE — Telephone Encounter (Signed)
We are stopping allergy vaccine as of now. She would need office visit to re-establish.

## 2014-04-10 NOTE — Telephone Encounter (Signed)
Mrs.Radabaugh's last allergy shot was 03/26/13. Pt. Is noncompliant. Pt. Stopped coming.

## 2014-10-06 ENCOUNTER — Encounter: Payer: Self-pay | Admitting: Podiatry

## 2014-10-09 ENCOUNTER — Encounter: Payer: Self-pay | Admitting: Internal Medicine

## 2015-11-12 ENCOUNTER — Encounter: Payer: Self-pay | Admitting: Internal Medicine

## 2016-06-30 ENCOUNTER — Ambulatory Visit (INDEPENDENT_AMBULATORY_CARE_PROVIDER_SITE_OTHER): Payer: BLUE CROSS/BLUE SHIELD

## 2016-06-30 ENCOUNTER — Ambulatory Visit (HOSPITAL_COMMUNITY)
Admission: EM | Admit: 2016-06-30 | Discharge: 2016-06-30 | Disposition: A | Discharge status: Home / Self Care | Payer: BLUE CROSS/BLUE SHIELD | Attending: Physician Assistant | Admitting: Physician Assistant

## 2016-06-30 ENCOUNTER — Encounter (HOSPITAL_COMMUNITY): Payer: Self-pay | Admitting: Emergency Medicine

## 2016-06-30 DIAGNOSIS — M65241 Calcific tendinitis, right hand: Secondary | ICD-10-CM

## 2016-06-30 DIAGNOSIS — M6530 Trigger finger, unspecified finger: Secondary | ICD-10-CM | POA: Diagnosis not present

## 2016-06-30 MED ORDER — PREDNISONE 10 MG PO TABS: 20.0000 mg | ORAL_TABLET | Freq: Every day | ORAL | 0 refills | Status: DC

## 2016-06-30 NOTE — ED Provider Notes (Signed)
CSN: [id number]     Arrival date & time 06/30/16  1604 History   First MD Initiated Contact with Patient 06/30/16 1640     Chief Complaint  Patient presents with  . Hand Pain   (Consider location/radiation/quality/duration/timing/severity/associated sxs/prior Treatment) HPI Patient is a 65 year old female presents with right hand pain that she has had for about 2 weeks. She states it is just slowly getting worse pain score at this time is about 4 or 5 areas she states that she has a finger that when she flexes it snaps to get back in place. She denies any recent and injury denies any recent x-rays. Past Medical History:  Diagnosis Date  . Acid reflux   . ALLERGIC RHINITIS   . Depression   . Diverticulosis   . Esophageal stricture   . Hiatal hernia    with  Schatzki's Ring  . Hyperlipidemia   . Hypertension   . IBS (irritable bowel syndrome)   . Kidney stones    Past Surgical History:  Procedure Laterality Date  . ABDOMINAL HYSTERECTOMY    . ANKLE FRACTURE SURGERY  2012   right  . COLONOSCOPY  multiple  . ESOPHAGOGASTRODUODENOSCOPY  09/14/05   multiple  . KIDNEY STONE SURGERY    . TONSILLECTOMY    . TUBAL LIGATION    . VESICOVAGINAL FISTULA CLOSURE W/ TAH     Family History  Problem Relation Age of Onset  . Allergies Daughter   . Allergies Son   . Allergies Mother   . Heart disease Mother     bypass surgery  . Hypertension Mother   . Hyperlipidemia Mother   . Heart disease Father     bypass surgery  . Hypertension Father   . Hyperlipidemia Father   . Stroke Father   . Diabetes Paternal Grandmother   . Colon cancer Neg Hx   . Esophageal cancer Neg Hx   . Rectal cancer Neg Hx   . Stomach cancer Neg Hx    Social History  Substance Use Topics  . Smoking status: Never Smoker  . Smokeless tobacco: Never Used  . Alcohol use No   OB History    Gravida Para Term Preterm AB Living   3 3 3     3    SAB TAB Ectopic Multiple Live Births                 Review of  Systems  Denies: HEADACHE, NAUSEA, ABDOMINAL PAIN, CHEST PAIN, CONGESTION, DYSURIA, SHORTNESS OF BREATH  Allergies  Review of patient's allergies indicates no known allergies.  Home Medications   Prior to Admission medications   Medication Sig Start Date End Date Taking? Authorizing Provider  benazepril-hydrochlorthiazide (LOTENSIN HCT) 20-12.5 MG per tablet Take 1 tablet by mouth Twice daily. 08/07/12  Yes Historical Provider, MD  cetirizine (ZYRTEC) 10 MG tablet Take 10 mg by mouth at bedtime.    Yes Historical Provider, MD  Choline Fenofibrate (TRILIPIX) 45 MG capsule Take 45 mg by mouth every morning.    Yes Historical Provider, MD  CYMBALTA 60 MG capsule Take 2 tablets by mouth every morning.  05/21/12  Yes Historical Provider, MD  fluticasone (FLONASE) 50 MCG/ACT nasal spray  12/13/13  Yes Historical Provider, MD  KLOR-CON M20 20 MEQ tablet Take 1 tablet by mouth daily. 08/06/12  Yes Historical Provider, MD  Multiple Vitamin (MULTIVITAMIN) capsule Take 1 capsule by mouth every morning.    Yes Historical Provider, MD  NEXIUM 40 MG capsule Take 1  tablet by mouth daily. 05/21/12  Yes Historical Provider, MD  simvastatin (ZOCOR) 40 MG tablet Take 1 tablet by mouth at bedtime.  07/30/12  Yes Historical Provider, MD  amoxicillin-clavulanate (AUGMENTIN) 875-125 MG per tablet  12/13/13   Historical Provider, MD  ciprofloxacin (CIPRO) 500 MG tablet  12/16/13   Historical Provider, MD  levofloxacin (LEVAQUIN) 500 MG tablet Take 1 tablet (500 mg total) by mouth daily. 06/07/13   Harden Mo, MD  metroNIDAZOLE (FLAGYL) 500 MG tablet  12/16/13   Historical Provider, MD  predniSONE (DELTASONE) 10 MG tablet Take 2 tablets (20 mg total) by mouth daily. 06/30/16   Konrad Felix, PA  triamcinolone cream (KENALOG) 0.1 % Apply topically 3 (three) times daily. 06/07/13   Harden Mo, MD   Meds Ordered and Administered this Visit  Medications - No data to display  BP 125/76 (BP Location: Left Arm)   Pulse 101    Temp 98.2 F (36.8 C) (Oral)   Resp 16   SpO2 98%  No data found.   Physical Exam NURSES NOTES AND VITAL SIGNS REVIEWED. CONSTITUTIONAL: Well developed, well nourished, no acute distress HEENT: normocephalic, atraumatic EYES: Conjunctiva normal NECK:normal ROM, supple, no adenopathy PULMONARY:No respiratory distress, normal effort ABDOMINAL: Soft, ND, NT BS+, No CVAT MUSCULOSKELETAL: Normal ROM of all extremities, RIGHT HAND, TENDERNESS. TRIGGER FINGER RIGHT LONG FINGER. NOT SYMPTOMATIC AT THIS TIME.  SKIN: warm and dry without rash PSYCHIATRIC: Mood and affect, behavior are normal  Urgent Care Course   Clinical Course    Procedures (including critical care time)  Labs Review Labs Reviewed - No data to display  Imaging Review Dg Hand Complete Right  Result Date: 06/30/2016 CLINICAL DATA:  Pain for 2 weeks without known injury. EXAM: RIGHT HAND - COMPLETE 3+ VIEW COMPARISON:  None. FINDINGS: There is no evidence of fracture or dislocation. There is no evidence of arthropathy or other focal bone abnormality. Soft tissues are unremarkable. IMPRESSION: Negative. Electronically Signed   By: Misty Stanley M.D.   On: 06/30/2016 17:03    Visual Acuity Review  Right Eye Distance:   Left Eye Distance:   Bilateral Distance:    Right Eye Near:   Left Eye Near:    Bilateral Near:       Wrist splint is applied advised to follow with primary care provider for trigger finger release.  MDM   1. Trigger finger, right   2. Calcific tendonitis of right hand     Patient is reassured that there are no issues that require transfer to higher level of care at this time or additional tests. Patient is advised to continue home symptomatic treatment. Patient is advised that if there are new or worsening symptoms to attend the emergency department, contact primary care provider, or return to UC. Instructions of care provided discharged home in stable condition.    THIS NOTE WAS  GENERATED USING A VOICE RECOGNITION SOFTWARE PROGRAM. ALL REASONABLE EFFORTS  WERE MADE TO PROOFREAD THIS DOCUMENT FOR ACCURACY.  I have verbally reviewed the discharge instructions with the patient. A printed AVS was given to the patient.  All questions were answered prior to discharge.      Konrad Felix, [place] 06/30/16 1819

## 2016-06-30 NOTE — Discharge Instructions (Signed)
WEAR BRACE, APPLY WARM COMPRESSES TO HAND.  PREDNISONE FOR SWELLING AND PAIN

## 2016-06-30 NOTE — ED Triage Notes (Signed)
The patient presented to the [facility] with a complaint of right hand pain that has been recurring for 6 months but more often in the last 2 weeks. The patient denied any known injury.

## 2017-04-14 ENCOUNTER — Other Ambulatory Visit: Payer: Self-pay | Admitting: Family Medicine

## 2017-04-14 DIAGNOSIS — R1013 Epigastric pain: Secondary | ICD-10-CM

## 2017-04-22 ENCOUNTER — Emergency Department (HOSPITAL_COMMUNITY): Payer: BLUE CROSS/BLUE SHIELD

## 2017-04-22 ENCOUNTER — Encounter (HOSPITAL_COMMUNITY): Payer: Self-pay | Admitting: Emergency Medicine

## 2017-04-22 ENCOUNTER — Inpatient Hospital Stay (HOSPITAL_COMMUNITY)
Admission: EM | Admit: 2017-04-22 | Discharge: 2017-04-24 | DRG: 379 | Disposition: A | Discharge status: Home / Self Care | Payer: BLUE CROSS/BLUE SHIELD | Attending: Internal Medicine | Admitting: Internal Medicine

## 2017-04-22 DIAGNOSIS — I10 Essential (primary) hypertension: Secondary | ICD-10-CM | POA: Diagnosis present

## 2017-04-22 DIAGNOSIS — E861 Hypovolemia: Secondary | ICD-10-CM | POA: Diagnosis present

## 2017-04-22 DIAGNOSIS — K5733 Diverticulitis of large intestine without perforation or abscess with bleeding: Secondary | ICD-10-CM | POA: Diagnosis present

## 2017-04-22 DIAGNOSIS — E785 Hyperlipidemia, unspecified: Secondary | ICD-10-CM | POA: Diagnosis present

## 2017-04-22 DIAGNOSIS — D509 Iron deficiency anemia, unspecified: Secondary | ICD-10-CM | POA: Diagnosis present

## 2017-04-22 DIAGNOSIS — K219 Gastro-esophageal reflux disease without esophagitis: Secondary | ICD-10-CM | POA: Diagnosis present

## 2017-04-22 DIAGNOSIS — Z87442 Personal history of urinary calculi: Secondary | ICD-10-CM

## 2017-04-22 DIAGNOSIS — Z833 Family history of diabetes mellitus: Secondary | ICD-10-CM | POA: Diagnosis not present

## 2017-04-22 DIAGNOSIS — I951 Orthostatic hypotension: Secondary | ICD-10-CM | POA: Diagnosis present

## 2017-04-22 DIAGNOSIS — E860 Dehydration: Secondary | ICD-10-CM | POA: Diagnosis present

## 2017-04-22 DIAGNOSIS — E876 Hypokalemia: Secondary | ICD-10-CM | POA: Clinically undetermined

## 2017-04-22 DIAGNOSIS — Z7952 Long term (current) use of systemic steroids: Secondary | ICD-10-CM | POA: Diagnosis not present

## 2017-04-22 DIAGNOSIS — K5792 Diverticulitis of intestine, part unspecified, without perforation or abscess without bleeding: Secondary | ICD-10-CM

## 2017-04-22 DIAGNOSIS — Z90710 Acquired absence of both cervix and uterus: Secondary | ICD-10-CM

## 2017-04-22 DIAGNOSIS — E7800 Pure hypercholesterolemia, unspecified: Secondary | ICD-10-CM | POA: Diagnosis present

## 2017-04-22 DIAGNOSIS — Z823 Family history of stroke: Secondary | ICD-10-CM

## 2017-04-22 DIAGNOSIS — Z79899 Other long term (current) drug therapy: Secondary | ICD-10-CM

## 2017-04-22 DIAGNOSIS — J3089 Other allergic rhinitis: Secondary | ICD-10-CM | POA: Diagnosis present

## 2017-04-22 DIAGNOSIS — D649 Anemia, unspecified: Secondary | ICD-10-CM | POA: Clinically undetermined

## 2017-04-22 DIAGNOSIS — K589 Irritable bowel syndrome without diarrhea: Secondary | ICD-10-CM | POA: Diagnosis present

## 2017-04-22 DIAGNOSIS — R110 Nausea: Secondary | ICD-10-CM | POA: Diagnosis not present

## 2017-04-22 DIAGNOSIS — F329 Major depressive disorder, single episode, unspecified: Secondary | ICD-10-CM | POA: Diagnosis present

## 2017-04-22 DIAGNOSIS — Z8249 Family history of ischemic heart disease and other diseases of the circulatory system: Secondary | ICD-10-CM | POA: Diagnosis not present

## 2017-04-22 DIAGNOSIS — J302 Other seasonal allergic rhinitis: Secondary | ICD-10-CM | POA: Diagnosis present

## 2017-04-22 LAB — COMPREHENSIVE METABOLIC PANEL
ALBUMIN: 4.1 g/dL (ref 3.5–5.0)
ALK PHOS: 76 U/L (ref 38–126)
ALT: 17 U/L (ref 14–54)
AST: 26 U/L (ref 15–41)
Anion gap: 12 (ref 5–15)
BILIRUBIN TOTAL: 0.8 mg/dL (ref 0.3–1.2)
BUN: 14 mg/dL (ref 6–20)
CO2: 23 mmol/L (ref 22–32)
Calcium: 9.4 mg/dL (ref 8.9–10.3)
Chloride: 102 mmol/L (ref 101–111)
Creatinine, Ser: 1.4 mg/dL — ABNORMAL HIGH (ref 0.44–1.00)
GFR calc non Af Amer: 39 mL/min — ABNORMAL LOW (ref >60)
GFR, EST AFRICAN AMERICAN: 45 mL/min — AB (ref >60)
GLUCOSE: 97 mg/dL (ref 65–99)
Potassium: 4.5 mmol/L (ref 3.5–5.1)
Sodium: 137 mmol/L (ref 135–145)
TOTAL PROTEIN: 7.4 g/dL (ref 6.5–8.1)

## 2017-04-22 LAB — ABO/RH: ABO/RH(D): A POS

## 2017-04-22 LAB — CBC
HCT: 42.2 % (ref 36.0–46.0)
HEMOGLOBIN: 13.9 g/dL (ref 12.0–15.0)
MCH: 28.1 pg (ref 26.0–34.0)
MCHC: 32.9 g/dL (ref 30.0–36.0)
MCV: 85.3 fL (ref 78.0–100.0)
Platelets: 353 10*3/uL (ref 150–400)
RBC: 4.95 MIL/uL (ref 3.87–5.11)
RDW: 13.9 % (ref 11.5–15.5)
WBC: 14.6 10*3/uL — ABNORMAL HIGH (ref 4.0–10.5)

## 2017-04-22 LAB — TYPE AND SCREEN
ABO/RH(D): A POS
Antibody Screen: NEGATIVE

## 2017-04-22 LAB — LIPASE, BLOOD: Lipase: 29 U/L (ref 11–51)

## 2017-04-22 MED ORDER — PANTOPRAZOLE SODIUM 40 MG PO TBEC
40.0000 mg | DELAYED_RELEASE_TABLET | Freq: Two times a day (BID) | ORAL | Status: DC
  Administered 2017-04-22 – 2017-04-24 (×4): 40 mg via ORAL
  Filled 2017-04-22 (×4): qty 1

## 2017-04-22 MED ORDER — SIMVASTATIN 40 MG PO TABS
40.0000 mg | ORAL_TABLET | Freq: Every evening | ORAL | Status: DC
  Administered 2017-04-22 – 2017-04-23 (×2): 40 mg via ORAL
  Filled 2017-04-22 (×2): qty 1

## 2017-04-22 MED ORDER — ONDANSETRON HCL 4 MG PO TABS: 4.0000 mg | ORAL_TABLET | Freq: Four times a day (QID) | ORAL | Status: DC | PRN

## 2017-04-22 MED ORDER — VITAMIN D 1000 UNITS PO TABS
1000.0000 [IU] | ORAL_TABLET | Freq: Every day | ORAL | Status: DC
  Administered 2017-04-22 – 2017-04-24 (×3): 1000 [IU] via ORAL
  Filled 2017-04-22 (×3): qty 1

## 2017-04-22 MED ORDER — LEVOCETIRIZINE DIHYDROCHLORIDE 5 MG PO TABS: 5.0000 mg | ORAL_TABLET | Freq: Every morning | ORAL | Status: DC

## 2017-04-22 MED ORDER — FLUTICASONE PROPIONATE 50 MCG/ACT NA SUSP: 2.0000 | Freq: Every day | NASAL | Status: DC | PRN

## 2017-04-22 MED ORDER — ONDANSETRON HCL 4 MG/2ML IJ SOLN: 4.0000 mg | Freq: Four times a day (QID) | INTRAMUSCULAR | Status: DC | PRN

## 2017-04-22 MED ORDER — ACETAMINOPHEN 325 MG PO TABS: 650.0000 mg | ORAL_TABLET | Freq: Four times a day (QID) | ORAL | Status: DC | PRN

## 2017-04-22 MED ORDER — LORATADINE 10 MG PO TABS
10.0000 mg | ORAL_TABLET | Freq: Every day | ORAL | Status: DC
  Administered 2017-04-23 – 2017-04-24 (×2): 10 mg via ORAL
  Filled 2017-04-22 (×2): qty 1

## 2017-04-22 MED ORDER — PIPERACILLIN-TAZOBACTAM 3.375 G IVPB
3.3750 g | Freq: Three times a day (TID) | INTRAVENOUS | Status: DC
  Administered 2017-04-23 – 2017-04-24 (×5): 3.375 g via INTRAVENOUS
  Filled 2017-04-22 (×7): qty 50

## 2017-04-22 MED ORDER — MAGNESIUM OXIDE 400 (241.3 MG) MG PO TABS
400.0000 mg | ORAL_TABLET | Freq: Every day | ORAL | Status: DC
  Administered 2017-04-22 – 2017-04-23 (×2): 400 mg via ORAL
  Filled 2017-04-22 (×4): qty 1

## 2017-04-22 MED ORDER — HYDROCODONE-ACETAMINOPHEN 5-325 MG PO TABS
1.0000 | ORAL_TABLET | ORAL | Status: DC | PRN
  Administered 2017-04-23 (×2): 1 via ORAL
  Filled 2017-04-22 (×2): qty 1

## 2017-04-22 MED ORDER — IOPAMIDOL (ISOVUE-300) INJECTION 61%
INTRAVENOUS | Status: AC
  Administered 2017-04-22: 75 mL
  Filled 2017-04-22: qty 75

## 2017-04-22 MED ORDER — ADULT MULTIVITAMIN W/MINERALS CH
1.0000 | ORAL_TABLET | Freq: Every morning | ORAL | Status: DC
  Administered 2017-04-23 – 2017-04-24 (×2): 1 via ORAL
  Filled 2017-04-22 (×2): qty 1

## 2017-04-22 MED ORDER — DICYCLOMINE HCL 20 MG PO TABS
40.0000 mg | ORAL_TABLET | Freq: Two times a day (BID) | ORAL | Status: DC
  Administered 2017-04-22 – 2017-04-24 (×4): 40 mg via ORAL
  Filled 2017-04-22 (×4): qty 2

## 2017-04-22 MED ORDER — SODIUM CHLORIDE 0.9 % IV SOLN
INTRAVENOUS | Status: DC
  Administered 2017-04-22 – 2017-04-23 (×4): via INTRAVENOUS

## 2017-04-22 MED ORDER — ACETAMINOPHEN 650 MG RE SUPP: 650.0000 mg | Freq: Four times a day (QID) | RECTAL | Status: DC | PRN

## 2017-04-22 MED ORDER — DULOXETINE HCL 60 MG PO CPEP
120.0000 mg | ORAL_CAPSULE | Freq: Every morning | ORAL | Status: DC
  Administered 2017-04-23 – 2017-04-24 (×2): 120 mg via ORAL
  Filled 2017-04-22 (×2): qty 2

## 2017-04-22 MED ORDER — ENOXAPARIN SODIUM 40 MG/0.4ML ~~LOC~~ SOLN
40.0000 mg | SUBCUTANEOUS | Status: DC
  Filled 2017-04-22: qty 0.4

## 2017-04-22 MED ORDER — PIPERACILLIN-TAZOBACTAM 3.375 G IVPB 30 MIN
3.3750 g | Freq: Once | INTRAVENOUS | Status: AC
  Administered 2017-04-22: 3.375 g via INTRAVENOUS
  Filled 2017-04-22: qty 50

## 2017-04-22 MED ORDER — SODIUM CHLORIDE 0.9 % IV BOLUS (SEPSIS)
1000.0000 mL | Freq: Once | INTRAVENOUS | Status: AC
  Administered 2017-04-22: 1000 mL via INTRAVENOUS

## 2017-04-22 NOTE — ED Provider Notes (Signed)
[facility] DEPT Provider Note   CSN: [id number] Arrival date & time: 04/22/17  1509     History   Chief Complaint Chief Complaint  Patient presents with  . Abdominal Pain    HPI Debbie [name] is a 66 y.o. female.  HPI Patient presents to the emergency department with left-sided abdominal discomfort.  The patient states that she was seen several weeks ago at [facility] walk-in clinic and started on antibiotics for diverticulitis.  The patient states that she went back to the clinic today, but she started having worsening pain.  The patient states they sent her here to the emergency department due to the fact that she had low blood pressure and they did note that she had a positive occult stool blood.  The patient states that she did seem to get some better with the previous treatment, but never fully resolved.  The patient states that palpation and movement make the pain worse Past Medical History:  Diagnosis Date  . Acid reflux   . ALLERGIC RHINITIS   . Depression   . Diverticulosis   . Esophageal stricture   . Hiatal hernia    with  Schatzki's Ring  . Hyperlipidemia   . Hypertension   . IBS (irritable bowel syndrome)   . Kidney stones     Patient Active Problem List   Diagnosis Date Noted  . Hypertension 08/30/2012  . Hypercholesteremia 08/30/2012  . Seasonal and perennial allergic rhinitis 08/18/2012  . Acute recurrent maxillary sinusitis 08/18/2012  . Asthma with bronchitis 08/18/2012    Past Surgical History:  Procedure Laterality Date  . ABDOMINAL HYSTERECTOMY    . ANKLE FRACTURE SURGERY  2012   right  . COLONOSCOPY  multiple  . ESOPHAGOGASTRODUODENOSCOPY  09/14/05   multiple  . KIDNEY STONE SURGERY    . TONSILLECTOMY    . TUBAL LIGATION    . VESICOVAGINAL FISTULA CLOSURE W/ TAH      OB History    Gravida Para Term Preterm AB Living   3 3 3     3    SAB TAB Ectopic Multiple Live Births                   Home Medications    Prior to Admission  medications   Medication Sig Start Date End Date Taking? Authorizing Provider  amoxicillin-clavulanate (AUGMENTIN) 875-125 MG per tablet  12/13/13   [provider]  benazepril-hydrochlorthiazide (LOTENSIN HCT) 20-12.5 MG per tablet Take 1 tablet by mouth Twice daily. 08/07/12   [provider]  cetirizine (ZYRTEC) 10 MG tablet Take 10 mg by mouth at bedtime.     [provider]  Choline Fenofibrate (TRILIPIX) 45 MG capsule Take 45 mg by mouth every morning.     [provider]  ciprofloxacin (CIPRO) 500 MG tablet  12/16/13   [provider]  CYMBALTA 60 MG capsule Take 2 tablets by mouth every morning.  05/21/12   [provider]  fluticasone Asencion Islam) 50 MCG/ACT nasal spray  12/13/13   [provider]  KLOR-CON M20 20 MEQ tablet Take 1 tablet by mouth daily. 08/06/12   [provider]  levofloxacin (LEVAQUIN) 500 MG tablet Take 1 tablet (500 mg total) by mouth daily. 06/07/13   Harden Mo, MD  metroNIDAZOLE (FLAGYL) 500 MG tablet  12/16/13   [provider]  Multiple Vitamin (MULTIVITAMIN) capsule Take 1 capsule by mouth every morning.     [provider]  NEXIUM 40 MG capsule  Take 1 tablet by mouth daily. 05/21/12   [provider]  predniSONE (DELTASONE) 10 MG tablet Take 2 tablets (20 mg total) by mouth daily. 06/30/16   Konrad Felix, PA  simvastatin (ZOCOR) 40 MG tablet Take 1 tablet by mouth at bedtime.  07/30/12   [provider]  triamcinolone cream (KENALOG) 0.1 % Apply topically 3 (three) times daily. 06/07/13   Harden Mo, MD    Family History Family History  Problem Relation Age of Onset  . Allergies Mother   . Heart disease Mother        bypass surgery  . Hypertension Mother   . Hyperlipidemia Mother   . Heart disease Father        bypass surgery  . Hypertension Father   . Hyperlipidemia Father   . Stroke Father   . Allergies Daughter   . Allergies Son   . Diabetes  Paternal Grandmother   . Colon cancer Neg Hx   . Esophageal cancer Neg Hx   . Rectal cancer Neg Hx   . Stomach cancer Neg Hx     Social History Social History  Substance Use Topics  . Smoking status: Never Smoker  . Smokeless tobacco: Never Used  . Alcohol use No     Allergies   Patient has no known allergies.   Review of Systems Review of Systems All other systems negative except as documented in the HPI. All pertinent positives and negatives as reviewed in the HPI.  Physical Exam Updated Vital Signs BP 122/78   Pulse 82   Temp 97.7 F (36.5 C) (Oral)   Resp (!) 21   SpO2 94%   Physical Exam  Constitutional: She is oriented to person, place, and time. She appears well-developed and well-nourished. No distress.  HENT:  Head: Normocephalic and atraumatic.  Mouth/Throat: Oropharynx is clear and moist.  Eyes: Pupils are equal, round, and reactive to light.  Neck: Normal range of motion. Neck supple.  Cardiovascular: Normal rate, regular rhythm and normal heart sounds.  Exam reveals no gallop and no friction rub.   No murmur heard. Pulmonary/Chest: Effort normal and breath sounds normal. No respiratory distress. She has no wheezes.  Abdominal: Soft. Bowel sounds are normal. She exhibits no distension and no mass. There is tenderness. There is guarding. There is no rebound.  Neurological: She is alert and oriented to person, place, and time. She exhibits normal muscle tone. Coordination normal.  Skin: Skin is warm and dry. Capillary refill takes less than 2 seconds. No rash noted. No erythema.  Psychiatric: She has a normal mood and affect. Her behavior is normal.  Nursing note and vitals reviewed.    ED Treatments / Results  Labs (all labs ordered are listed, but only abnormal results are displayed) Labs Reviewed  COMPREHENSIVE METABOLIC PANEL - Abnormal; Notable for the following:       Result Value   Creatinine, Ser 1.40 (*)    GFR calc non Af Amer 39 (*)     GFR calc Af Amer 45 (*)    All other components within normal limits  CBC - Abnormal; Notable for the following:    WBC 14.6 (*)    All other components within normal limits  LIPASE, BLOOD  URINALYSIS, ROUTINE W REFLEX MICROSCOPIC  I-STAT CG4 LACTIC ACID, ED  TYPE AND SCREEN  ABO/RH    EKG  EKG Interpretation None       Radiology Ct Abdomen Pelvis W Contrast  Result Date:  04/22/2017 CLINICAL DATA:  Left lower quadrant pain 1 week with some dizziness. Positive rectal bleeding and hypotension. EXAM: CT ABDOMEN AND PELVIS WITH CONTRAST TECHNIQUE: Multidetector CT imaging of the abdomen and pelvis was performed using the standard protocol following bolus administration of intravenous contrast. CONTRAST:  57mL ISOVUE-300 IOPAMIDOL (ISOVUE-300) INJECTION 61% COMPARISON:  03/05/2014 and 10/16/2012 FINDINGS: Lower chest: Some linear bibasilar atelectasis. Small to moderate hiatal hernia. Hepatobiliary: 4.9 cm cyst over the right lobe of the liver without significant change. Gallbladder and biliary tree are within normal. Pancreas: Within normal. Spleen: Within normal. Adrenals/Urinary Tract: Adrenal glands are normal. Kidneys are normal in size without hydronephrosis or nephrolithiasis. Subcentimeter hypodensity over the mid pole right kidney unchanged and likely a cyst but too small to characterize. Ureters and bladder are normal. Stomach/Bowel: Hiatal hernia as described above. Small bowel is within normal. Appendix is normal. Diverticulosis of the colon most prominent over the sigmoid colon. There is inflammatory change adjacent a short segment of the sigmoid colon in the left pelvis compatible acute diverticulitis. Small amount of free fluid in the pelvis. No evidence of perforation or abscess. No significant adjacent adenopathy. Vascular/Lymphatic: Mild calcified plaque over the abdominal aorta. Remaining vascular structures are within normal. No adenopathy. Reproductive: Previous hysterectomy.   Ovaries within normal. Other: None. Musculoskeletal: Degenerative change of the spine and hips. IMPRESSION: Evidence of acute diverticulitis involving a short segment of the sigmoid colon in the left pelvis. No evidence of perforation or abscess. 4.9 cm right liver cyst unchanged. Subcentimeter right renal hypodensity unchanged and too small to characterize but likely a cyst. Mild aortic atherosclerosis. Electronically Signed   By: Marin Olp M.D.   On: 04/22/2017 18:30    Procedures Procedures (including critical care time)  Medications Ordered in ED Medications  piperacillin-tazobactam (ZOSYN) IVPB 3.375 g (not administered)  sodium chloride 0.9 % bolus 1,000 mL (1,000 mLs Intravenous New Bag/Given 04/22/17 1651)  iopamidol (ISOVUE-300) 61 % injection (75 mLs  Contrast Given 04/22/17 1749)     Initial Impression / Assessment and Plan / ED Course  I have reviewed the triage vital signs and the nursing notes.  Pertinent labs & imaging results that were available during my care of the patient were reviewed by me and considered in my medical decision making (see chart for details).     The patient will need admission to the hospital due to multiple factors.  The patient had been on outpatient therapy for diverticulitis and the fact that she is hypotensive.  The patient will be given IV antibiotics, along with IV fluids.  Patient is otherwise stable.  Patient has failed outpatient therapy.   Final Clinical Impressions(s) / ED Diagnoses   Final diagnoses:  None    New Prescriptions New Prescriptions   No medications on file     Dalia Heading, Hershal Coria 04/22/17 Gustavo Lah, MD 04/26/17 [phone]

## 2017-04-22 NOTE — ED Notes (Signed)
Pt given coke to drink per [place], [place]

## 2017-04-22 NOTE — ED Triage Notes (Signed)
Per [facility]: Pt to ED from [facility] for further evaluation of LLQ abd pain and lightheadedness. Pt had occult blood done at PCP, which came back positive. Hx diverticulitis, HTN, IBS. Patient + orthostatics 100/60 lying - SBP decreased to 60 upon standing. 20g. LFA, given 250cc NS en route. Patient denies fevers, no rigidity or abd distention. LLQ is tender to palpation and pt describes pain is worse with movement. EMS VS: HR 92 NSR, 99/60, 94% RA - increased to 98% on 2L O2, CBG 116. A&O x 4. Resp e/u.

## 2017-04-22 NOTE — Progress Notes (Signed)
Pharmacy Antibiotic Note  Debbie [name] is a 66 y.o. female admitted on 04/22/2017 with diverticulitis (failed cipro and flagyl).  Pharmacy has been consulted for zosyn dosing. -WBC= 14.6, CrCl ~ 45  Plan:  -Zosyn 3.375gm IV q8h -Will follow renal function, cultures and clinical progress    Temp (24hrs), Avg:97.7 F (36.5 C), Min:97.7 F (36.5 C), Max:97.7 F (36.5 C)   Recent Labs Lab 04/22/17 1534  WBC 14.6*  CREATININE 1.40*    CrCl cannot be calculated (Unknown ideal weight.).    No Known Allergies  Antimicrobials this admission: 5/19 zosyn>>  Dose adjustments this admission:   Microbiology results:   Thank you for allowing pharmacy to be a part of this patient's care.  Hildred Laser, Pharm D 04/22/2017 7:39 PM

## 2017-04-22 NOTE — ED Notes (Signed)
Patient to CT.

## 2017-04-22 NOTE — H&P (Signed)
History and Physical    ADLYN FIFE VOZ:[id number] DOB: [birth year] DOA: 04/22/2017  PCP: Harlan Stains, MD  Patient coming from: Home  I have personally briefly reviewed patient's old medical records in [place]  Chief Complaint: Abd pain  HPI: NERINE PULSE is a 66 y.o. female with medical history significant of Diverticulitis, has had "a couple" of episodes since last seen [facility] for this in 2013.  But always was treated with cipro/flagyl as outpatient successfully until now.  Patient's abd pain onset several weeks ago, treated with Cipro/flagyl by PCP, symptoms initially improved but didn't fully resolve and ultimately after finishing treatment she started having worsening pain.  Went to walk in clinic today who sent her here due to worsening LLQ abd pain.  They also noted heme positive stool at walk in clinic.   ED Course: Orthostatic with BP drop to 80s on standing.  WBC 14k, CT confirms uncomplicated diverticulitis.  Given 1.25 L NS bolus thus far.  Creat 1.4.  Patient started on zosyn in ED.   Review of Systems: As per HPI otherwise 10 point review of systems negative.   Past Medical History:  Diagnosis Date  . Acid reflux   . ALLERGIC RHINITIS   . Depression   . Diverticulosis   . Esophageal stricture   . Hiatal hernia    with  Schatzki's Ring  . Hyperlipidemia   . Hypertension   . IBS (irritable bowel syndrome)   . Kidney stones     Past Surgical History:  Procedure Laterality Date  . ABDOMINAL HYSTERECTOMY    . ANKLE FRACTURE SURGERY  2012   right  . COLONOSCOPY  multiple  . ESOPHAGOGASTRODUODENOSCOPY  09/14/05   multiple  . KIDNEY STONE SURGERY    . TONSILLECTOMY    . TUBAL LIGATION    . VESICOVAGINAL FISTULA CLOSURE W/ TAH       reports that she has never smoked. She has never used smokeless tobacco. She reports that she does not drink alcohol or use drugs.  No Known Allergies  Family History  Problem Relation Age of Onset  .  Allergies Mother   . Heart disease Mother        bypass surgery  . Hypertension Mother   . Hyperlipidemia Mother   . Heart disease Father        bypass surgery  . Hypertension Father   . Hyperlipidemia Father   . Stroke Father   . Allergies Daughter   . Allergies Son   . Diabetes Paternal Grandmother   . Colon cancer Neg Hx   . Esophageal cancer Neg Hx   . Rectal cancer Neg Hx   . Stomach cancer Neg Hx      Prior to Admission medications   Medication Sig Start Date End Date Taking? Authorizing Provider  benazepril-hydrochlorthiazide (LOTENSIN HCT) 20-12.5 MG per tablet Take 1 tablet by mouth Twice daily. 08/07/12   [provider]  cetirizine (ZYRTEC) 10 MG tablet Take 10 mg by mouth at bedtime.     [provider]  Choline Fenofibrate (TRILIPIX) 45 MG capsule Take 45 mg by mouth every morning.     [provider]  CYMBALTA 60 MG capsule Take 2 tablets by mouth every morning.  05/21/12   [provider]  fluticasone Asencion Islam) 50 MCG/ACT nasal spray  12/13/13   [provider]  KLOR-CON M20 20 MEQ tablet Take 1 tablet by mouth daily. 08/06/12   [provider]  Multiple Vitamin (MULTIVITAMIN) capsule Take 1 capsule by mouth every morning.     [provider]  NEXIUM 40 MG capsule Take 1 tablet by mouth daily. 05/21/12   [provider]  predniSONE (DELTASONE) 10 MG tablet Take 2 tablets (20 mg total) by mouth daily. 06/30/16   Konrad Felix, PA  simvastatin (ZOCOR) 40 MG tablet Take 1 tablet by mouth at bedtime.  07/30/12   [provider]  triamcinolone cream (KENALOG) 0.1 % Apply topically 3 (three) times daily. 06/07/13   Harden Mo, MD    Physical Exam: Vitals:   04/22/17 1715 04/22/17 1730 04/22/17 1815 04/22/17 1836  BP: 118/66 125/79 123/66 122/78  Pulse: 79 83  82  Resp: 16 13 16  (!) 21  Temp:      TempSrc:      SpO2: 97% 97%  94%    Constitutional: NAD, calm, comfortable Eyes: PERRL,  lids and conjunctivae normal ENMT: Mucous membranes are moist. Posterior pharynx clear of any exudate or lesions.Normal dentition.  Neck: normal, supple, no masses, no thyromegaly Respiratory: clear to auscultation bilaterally, no wheezing, no crackles. Normal respiratory effort. No accessory muscle use.  Cardiovascular: Regular rate and rhythm, no murmurs / rubs / gallops. No extremity edema. 2+ pedal pulses. No carotid bruits.  Abdomen: LLQ TTP Musculoskeletal: no clubbing / cyanosis. No joint deformity upper and lower extremities. Good ROM, no contractures. Normal muscle tone.  Skin: no rashes, lesions, ulcers. No induration Neurologic: CN 2-12 grossly intact. Sensation intact, DTR normal. Strength 5/5 in all 4.  Psychiatric: Normal judgment and insight. Alert and oriented x 3. Normal mood.    Labs on Admission: I have personally reviewed following labs and imaging studies  CBC:  Recent Labs Lab 04/22/17 1534  WBC 14.6*  HGB 13.9  HCT 42.2  MCV 85.3  PLT 716   Basic Metabolic Panel:  Recent Labs Lab 04/22/17 1534  NA 137  K 4.5  CL 102  CO2 23  GLUCOSE 97  BUN 14  CREATININE 1.40*  CALCIUM 9.4   GFR: CrCl cannot be calculated (Unknown ideal weight.). Liver Function Tests:  Recent Labs Lab 04/22/17 1534  AST 26  ALT 17  ALKPHOS 76  BILITOT 0.8  PROT 7.4  ALBUMIN 4.1    Recent Labs Lab 04/22/17 1534  LIPASE 29   No results for input(s): AMMONIA in the last 168 hours. Coagulation Profile: No results for input(s): INR, PROTIME in the last 168 hours. Cardiac Enzymes: No results for input(s): CKTOTAL, CKMB, CKMBINDEX, TROPONINI in the last 168 hours. BNP (last 3 results) No results for input(s): PROBNP in the last 8760 hours. HbA1C: No results for input(s): HGBA1C in the last 72 hours. CBG: No results for input(s): GLUCAP in the last 168 hours. Lipid Profile: No results for input(s): CHOL, HDL, LDLCALC, TRIG, CHOLHDL, LDLDIRECT in the last 72  hours. Thyroid Function Tests: No results for input(s): TSH, T4TOTAL, FREET4, T3FREE, THYROIDAB in the last 72 hours. Anemia Panel: No results for input(s): VITAMINB12, FOLATE, FERRITIN, TIBC, IRON, RETICCTPCT in the last 72 hours. Urine analysis:    Component Value Date/Time   COLORURINE YELLOW 07/30/2007 1342   APPEARANCEUR CLEAR 07/30/2007 1342   LABSPEC 1.028 07/30/2007 1342   PHURINE 6.5 07/30/2007 1342   GLUCOSEU NEGATIVE 07/30/2007 1342   HGBUR NEGATIVE 07/30/2007 1342   BILIRUBINUR NEGATIVE 07/30/2007 1342   KETONESUR 15 (A) 07/30/2007 1342   PROTEINUR NEGATIVE 07/30/2007 1342   UROBILINOGEN 1.0 07/30/2007 1342  NITRITE NEGATIVE 07/30/2007 1342   LEUKOCYTESUR  07/30/2007 1342    NEGATIVE MICROSCOPIC NOT DONE ON URINES WITH NEGATIVE PROTEIN, BLOOD, LEUKOCYTES, NITRITE, OR GLUCOSE <1000 mg/dL.    Radiological Exams on Admission: Ct Abdomen Pelvis W Contrast  Result Date: 04/22/2017 CLINICAL DATA:  Left lower quadrant pain 1 week with some dizziness. Positive rectal bleeding and hypotension. EXAM: CT ABDOMEN AND PELVIS WITH CONTRAST TECHNIQUE: Multidetector CT imaging of the abdomen and pelvis was performed using the standard protocol following bolus administration of intravenous contrast. CONTRAST:  99mL ISOVUE-300 IOPAMIDOL (ISOVUE-300) INJECTION 61% COMPARISON:  03/05/2014 and 10/16/2012 FINDINGS: Lower chest: Some linear bibasilar atelectasis. Small to moderate hiatal hernia. Hepatobiliary: 4.9 cm cyst over the right lobe of the liver without significant change. Gallbladder and biliary tree are within normal. Pancreas: Within normal. Spleen: Within normal. Adrenals/Urinary Tract: Adrenal glands are normal. Kidneys are normal in size without hydronephrosis or nephrolithiasis. Subcentimeter hypodensity over the mid pole right kidney unchanged and likely a cyst but too small to characterize. Ureters and bladder are normal. Stomach/Bowel: Hiatal hernia as described above. Small bowel  is within normal. Appendix is normal. Diverticulosis of the colon most prominent over the sigmoid colon. There is inflammatory change adjacent a short segment of the sigmoid colon in the left pelvis compatible acute diverticulitis. Small amount of free fluid in the pelvis. No evidence of perforation or abscess. No significant adjacent adenopathy. Vascular/Lymphatic: Mild calcified plaque over the abdominal aorta. Remaining vascular structures are within normal. No adenopathy. Reproductive: Previous hysterectomy.  Ovaries within normal. Other: None. Musculoskeletal: Degenerative change of the spine and hips. IMPRESSION: Evidence of acute diverticulitis involving a short segment of the sigmoid colon in the left pelvis. No evidence of perforation or abscess. 4.9 cm right liver cyst unchanged. Subcentimeter right renal hypodensity unchanged and too small to characterize but likely a cyst. Mild aortic atherosclerosis. Electronically Signed   By: Marin Olp M.D.   On: 04/22/2017 18:30    EKG: Independently reviewed.  Assessment/Plan Principal Problem:   Diverticulitis large intestine w/o perforation or abscess w/bleeding Active Problems:   Hypertension    1. Diverticulitis - uncomplicated, but failed outpatient treatment 1. IVF 2. Zosyn 3. Repeat CBC/BMP in AM, did have heme positive stool at PCPs office 4. zofran PRN nausea 5. Tylenol or norco PRN pain 2. HTN - Holding BP meds due to initial orthostatic hypotension on presentation.  Also not clear if creat of 1.4 is baseline or pre-renal.  DVT prophylaxis: SCDs - heme positive stool Code Status: Full Family Communication: No family in room Disposition Plan: Home after admit Consults called: None Admission status: Admit to inpatient - Failed appropriate outpatient treatment with cipro/flagyl   Etta Quill DO [facility] Pager [phone]  If 7AM-7PM, please contact day team taking care of patient [email or web address] Password  [facility]  04/22/2017, 7:43 PM

## 2017-04-22 NOTE — ED Notes (Signed)
Stool occult done at PCP, results +. MD aware. Verbal order to d/c occult blood.

## 2017-04-22 NOTE — ED Notes (Signed)
Attempted to obtain urine specimen; Pt unable to provide one at this time 

## 2017-04-23 ENCOUNTER — Encounter (HOSPITAL_COMMUNITY): Payer: Self-pay

## 2017-04-23 DIAGNOSIS — E876 Hypokalemia: Secondary | ICD-10-CM | POA: Clinically undetermined

## 2017-04-23 DIAGNOSIS — D649 Anemia, unspecified: Secondary | ICD-10-CM | POA: Clinically undetermined

## 2017-04-23 DIAGNOSIS — E7800 Pure hypercholesterolemia, unspecified: Secondary | ICD-10-CM

## 2017-04-23 DIAGNOSIS — K5733 Diverticulitis of large intestine without perforation or abscess with bleeding: Principal | ICD-10-CM

## 2017-04-23 DIAGNOSIS — I10 Essential (primary) hypertension: Secondary | ICD-10-CM

## 2017-04-23 DIAGNOSIS — D509 Iron deficiency anemia, unspecified: Secondary | ICD-10-CM

## 2017-04-23 DIAGNOSIS — I951 Orthostatic hypotension: Secondary | ICD-10-CM | POA: Diagnosis present

## 2017-04-23 DIAGNOSIS — J302 Other seasonal allergic rhinitis: Secondary | ICD-10-CM

## 2017-04-23 DIAGNOSIS — J3089 Other allergic rhinitis: Secondary | ICD-10-CM

## 2017-04-23 LAB — URINALYSIS, ROUTINE W REFLEX MICROSCOPIC
Bilirubin Urine: NEGATIVE
GLUCOSE, UA: NEGATIVE mg/dL
HGB URINE DIPSTICK: NEGATIVE
Ketones, ur: NEGATIVE mg/dL
Leukocytes, UA: NEGATIVE
Nitrite: NEGATIVE
PH: 5 (ref 5.0–8.0)
Protein, ur: NEGATIVE mg/dL
SPECIFIC GRAVITY, URINE: 1.012 (ref 1.005–1.030)

## 2017-04-23 LAB — BASIC METABOLIC PANEL
Anion gap: 6 (ref 5–15)
BUN: 14 mg/dL (ref 6–20)
CO2: 28 mmol/L (ref 22–32)
CREATININE: 1.02 mg/dL — AB (ref 0.44–1.00)
Calcium: 8.3 mg/dL — ABNORMAL LOW (ref 8.9–10.3)
Chloride: 104 mmol/L (ref 101–111)
GFR calc non Af Amer: 57 mL/min — ABNORMAL LOW (ref >60)
GLUCOSE: 106 mg/dL — AB (ref 65–99)
Potassium: 3.4 mmol/L — ABNORMAL LOW (ref 3.5–5.1)
Sodium: 138 mmol/L (ref 135–145)

## 2017-04-23 LAB — FOLATE: FOLATE: 33.4 ng/mL (ref >5.9)

## 2017-04-23 LAB — IRON AND TIBC
IRON: 28 ug/dL (ref 28–170)
Saturation Ratios: 9 % — ABNORMAL LOW (ref 10.4–31.8)
TIBC: 307 ug/dL (ref 250–450)
UIBC: 279 ug/dL

## 2017-04-23 LAB — VITAMIN B12: Vitamin B-12: 999 pg/mL — ABNORMAL HIGH (ref 180–914)

## 2017-04-23 LAB — CBC
HEMATOCRIT: 33.5 % — AB (ref 36.0–46.0)
Hemoglobin: 10.9 g/dL — ABNORMAL LOW (ref 12.0–15.0)
MCH: 27.7 pg (ref 26.0–34.0)
MCHC: 32.5 g/dL (ref 30.0–36.0)
MCV: 85.2 fL (ref 78.0–100.0)
PLATELETS: 300 10*3/uL (ref 150–400)
RBC: 3.93 MIL/uL (ref 3.87–5.11)
RDW: 13.9 % (ref 11.5–15.5)
WBC: 9 10*3/uL (ref 4.0–10.5)

## 2017-04-23 LAB — HIV ANTIBODY (ROUTINE TESTING W REFLEX): HIV SCREEN 4TH GENERATION: NONREACTIVE

## 2017-04-23 LAB — FERRITIN: Ferritin: 48 ng/mL (ref 11–307)

## 2017-04-23 MED ORDER — POTASSIUM CHLORIDE CRYS ER 20 MEQ PO TBCR
40.0000 meq | EXTENDED_RELEASE_TABLET | Freq: Every morning | ORAL | Status: DC
  Administered 2017-04-23 – 2017-04-24 (×2): 40 meq via ORAL
  Filled 2017-04-23 (×2): qty 2

## 2017-04-23 NOTE — Progress Notes (Signed)
PROGRESS NOTE    Debbie [name]  ION:[id number] DOB: 07/23/1951 DOA: 04/22/2017 PCP: Harlan Stains, MD    Brief Narrative:  Patient is a 66 year old female history of diverticulitis that has usually been managed in the outpatient setting successfully with ciprofloxacin and Flagyl. Patient presented to the ED with several weeks of abdominal pain initially treated with Cipro and Flagyl with initial symptomatic improvement however after patient finished course of treatment started undergoing worsening abdominal pain and presented to the walk-in clinic. Patient was noted to be heme positive other walk-in clinic and sent to the ED. On presentation to the ED patient was noted to be orthostatic with a leukocytosis with a white count of 14,000 and CT consistent with diverticulitis. Patient placed on IV Zosyn.   Assessment & Plan:   Principal Problem:   Diverticulitis large intestine w/o perforation or abscess w/bleeding Active Problems:   Seasonal and perennial allergic rhinitis   Hypertension   Hypercholesteremia   Anemia   Hypokalemia   Orthostatic hypotension  #1 acute diverticulitis Patient presented with a bout of acute diverticulitis that had failed outpatient treatment. Patient with clinical improvement. Leukocytosis improved. Patient tolerating full liquid diet. Continue IV fluids, IV Zosyn, pain management. Will advance diet to a soft diet for breakfast.  #2 hypokalemia Replete. Resume home supplemental potassium.  #3 orthostatic hypotension On admission patient was noted to be orthostatic with systolic blood pressures in the 80s on standing. Likely secondary to hypovolemia/dehydration. Continue IV fluids. Repeat orthostatics in the morning.  #4 iron deficient anemia It was noted per admitted into the patient's FOBT was positive at PCPs office. Patient denies any overt bleeding. Hemoglobin currently at 10.9 from 13.9 on admission. Likely impart due to a dilutional effect. Anemia  panel consistent with iron deficiency anemia. Follow H&H. Will likely need oral iron tablets started in the outpatient setting and will defer to PCP.  #5 hyperlipidemia Stable. Continue statin.  #6 allergies Continue Claritin.  #7 hypertension Details orthostatic hypotension we'll hold antihypertensive medications for now. Follow.   DVT prophylaxis: SCDs Code Status: Full Family Communication: Updated patient and family at bedside. Disposition Plan: Home when medically stable and tolerating soft diet hopefully in the next 24-48 hours.   Consultants:   None  Procedures:   CT abdomen and pelvis 04/22/2017    Antimicrobials:   IV Zosyn 04/22/2017   Subjective: Patient laying in bed. Patient denies any further nausea or vomiting. Patient tolerating full liquids. Patient with complaints of lower abdominal cramping when trying to use the bathroom. Patient denies any overt bleeding. Patient states feels better than on admission. Patient asking whether she can go home.  Objective: Vitals:   04/22/17 2030 04/22/17 2105 04/23/17 0541 04/23/17 1622  BP: (!) 104/51 (!) 155/63 121/65 119/62  Pulse: 80 86 70 76  Resp: 15 16 18 18   Temp:  98.7 F (37.1 C) 97.6 F (36.4 C) 98.1 F (36.7 C)  TempSrc:  Oral Oral Oral  SpO2: 95% 97% 96% 95%  Weight:  75.2 kg (165 lb 12.8 oz)    Height:  5\' 3"  (1.6 m)      Intake/Output Summary (Last 24 hours) at 04/23/17 1746 Last data filed at 04/23/17 1500  Gross per 24 hour  Intake          2654.58 ml  Output             1750 ml  Net           904.58 ml  Filed Weights   04/22/17 2105  Weight: 75.2 kg (165 lb 12.8 oz)    Examination:  General exam: Appears calm and comfortable  Respiratory system: Clear to auscultation. Respiratory effort normal. Cardiovascular system: S1 & S2 heard, RRR. No JVD, murmurs, rubs, gallops or clicks. No pedal edema. Gastrointestinal system: Abdomen is nondistended, soft and tender to palpation in the  left lower quadrant. No organomegaly or masses felt. Normal bowel sounds heard. Central nervous system: Alert and oriented. No focal neurological deficits. Extremities: Symmetric 5 x 5 power. Skin: No rashes, lesions or ulcers Psychiatry: Judgement and insight appear normal. Mood & affect appropriate.     Data Reviewed: I have personally reviewed following labs and imaging studies  CBC:  Recent Labs Lab 04/22/17 1534 04/23/17 0434  WBC 14.6* 9.0  HGB 13.9 10.9*  HCT 42.2 33.5*  MCV 85.3 85.2  PLT 353 128   Basic Metabolic Panel:  Recent Labs Lab 04/22/17 1534 04/23/17 0434  NA 137 138  K 4.5 3.4*  CL 102 104  CO2 23 28  GLUCOSE 97 106*  BUN 14 14  CREATININE 1.40* 1.02*  CALCIUM 9.4 8.3*   GFR: Estimated Creatinine Clearance: 54.8 mL/min (A) (by C-G formula based on SCr of 1.02 mg/dL (H)). Liver Function Tests:  Recent Labs Lab 04/22/17 1534  AST 26  ALT 17  ALKPHOS 76  BILITOT 0.8  PROT 7.4  ALBUMIN 4.1    Recent Labs Lab 04/22/17 1534  LIPASE 29   No results for input(s): AMMONIA in the last 168 hours. Coagulation Profile: No results for input(s): INR, PROTIME in the last 168 hours. Cardiac Enzymes: No results for input(s): CKTOTAL, CKMB, CKMBINDEX, TROPONINI in the last 168 hours. BNP (last 3 results) No results for input(s): PROBNP in the last 8760 hours. HbA1C: No results for input(s): HGBA1C in the last 72 hours. CBG: No results for input(s): GLUCAP in the last 168 hours. Lipid Profile: No results for input(s): CHOL, HDL, LDLCALC, TRIG, CHOLHDL, LDLDIRECT in the last 72 hours. Thyroid Function Tests: No results for input(s): TSH, T4TOTAL, FREET4, T3FREE, THYROIDAB in the last 72 hours. Anemia Panel:  Recent Labs  04/23/17 0954  VITAMINB12 999*  FOLATE 33.4  FERRITIN 48  TIBC 307  IRON 28   Sepsis Labs: No results for input(s): PROCALCITON, LATICACIDVEN in the last 168 hours.  No results found for this or any previous visit  (from the past 240 hour(s)).       Radiology Studies: Ct Abdomen Pelvis W Contrast  Result Date: 04/22/2017 CLINICAL DATA:  Left lower quadrant pain 1 week with some dizziness. Positive rectal bleeding and hypotension. EXAM: CT ABDOMEN AND PELVIS WITH CONTRAST TECHNIQUE: Multidetector CT imaging of the abdomen and pelvis was performed using the standard protocol following bolus administration of intravenous contrast. CONTRAST:  41mL ISOVUE-300 IOPAMIDOL (ISOVUE-300) INJECTION 61% COMPARISON:  03/05/2014 and 10/16/2012 FINDINGS: Lower chest: Some linear bibasilar atelectasis. Small to moderate hiatal hernia. Hepatobiliary: 4.9 cm cyst over the right lobe of the liver without significant change. Gallbladder and biliary tree are within normal. Pancreas: Within normal. Spleen: Within normal. Adrenals/Urinary Tract: Adrenal glands are normal. Kidneys are normal in size without hydronephrosis or nephrolithiasis. Subcentimeter hypodensity over the mid pole right kidney unchanged and likely a cyst but too small to characterize. Ureters and bladder are normal. Stomach/Bowel: Hiatal hernia as described above. Small bowel is within normal. Appendix is normal. Diverticulosis of the colon most prominent over the sigmoid colon. There is inflammatory change adjacent  a short segment of the sigmoid colon in the left pelvis compatible acute diverticulitis. Small amount of free fluid in the pelvis. No evidence of perforation or abscess. No significant adjacent adenopathy. Vascular/Lymphatic: Mild calcified plaque over the abdominal aorta. Remaining vascular structures are within normal. No adenopathy. Reproductive: Previous hysterectomy.  Ovaries within normal. Other: None. Musculoskeletal: Degenerative change of the spine and hips. IMPRESSION: Evidence of acute diverticulitis involving a short segment of the sigmoid colon in the left pelvis. No evidence of perforation or abscess. 4.9 cm right liver cyst unchanged.  Subcentimeter right renal hypodensity unchanged and too small to characterize but likely a cyst. Mild aortic atherosclerosis. Electronically Signed   By: Marin Olp M.D.   On: 04/22/2017 18:30        Scheduled Meds: . cholecalciferol  1,000 Units Oral Daily  . dicyclomine  40 mg Oral BID  . DULoxetine  120 mg Oral q morning - 10a  . loratadine  10 mg Oral Daily  . magnesium oxide  400 mg Oral QHS  . multivitamin with minerals  1 tablet Oral q morning - 10a  . pantoprazole  40 mg Oral BID  . potassium chloride SA  40 mEq Oral q morning - 10a  . simvastatin  40 mg Oral QPM   Continuous Infusions: . sodium chloride 125 mL/hr at 04/23/17 1325  . piperacillin-tazobactam (ZOSYN)  IV Stopped (04/23/17 1726)     LOS: 1 day    Time spent: 79 mins    Anayiah Howden, MD [facility] Pager [phone]  If 7PM-7AM, please contact night-coverage [email or web address] Password [facility] 04/23/2017, 5:46 PM

## 2017-04-24 LAB — CBC WITH DIFFERENTIAL/PLATELET
Basophils Absolute: 0 10*3/uL (ref 0.0–0.1)
Basophils Relative: 0 %
EOS PCT: 4 %
Eosinophils Absolute: 0.2 10*3/uL (ref 0.0–0.7)
HCT: 35 % — ABNORMAL LOW (ref 36.0–46.0)
Hemoglobin: 11.5 g/dL — ABNORMAL LOW (ref 12.0–15.0)
LYMPHS ABS: 2.1 10*3/uL (ref 0.7–4.0)
Lymphocytes Relative: 34 %
MCH: 27.8 pg (ref 26.0–34.0)
MCHC: 32.9 g/dL (ref 30.0–36.0)
MCV: 84.7 fL (ref 78.0–100.0)
MONO ABS: 0.5 10*3/uL (ref 0.1–1.0)
Monocytes Relative: 7 %
Neutro Abs: 3.5 10*3/uL (ref 1.7–7.7)
Neutrophils Relative %: 55 %
Platelets: 291 10*3/uL (ref 150–400)
RBC: 4.13 MIL/uL (ref 3.87–5.11)
RDW: 13.4 % (ref 11.5–15.5)
WBC: 6.3 10*3/uL (ref 4.0–10.5)

## 2017-04-24 LAB — BASIC METABOLIC PANEL
Anion gap: 9 (ref 5–15)
BUN: 5 mg/dL — ABNORMAL LOW (ref 6–20)
CO2: 25 mmol/L (ref 22–32)
Calcium: 8.7 mg/dL — ABNORMAL LOW (ref 8.9–10.3)
Chloride: 106 mmol/L (ref 101–111)
Creatinine, Ser: 0.61 mg/dL (ref 0.44–1.00)
GFR calc Af Amer: >60 mL/min (ref >60)
GFR calc non Af Amer: >60 mL/min (ref >60)
Glucose, Bld: 84 mg/dL (ref 65–99)
POTASSIUM: 3.5 mmol/L (ref 3.5–5.1)
Sodium: 140 mmol/L (ref 135–145)

## 2017-04-24 LAB — MAGNESIUM: Magnesium: 1.6 mg/dL — ABNORMAL LOW (ref 1.7–2.4)

## 2017-04-24 MED ORDER — AMOXICILLIN-POT CLAVULANATE 875-125 MG PO TABS: 1.0000 | ORAL_TABLET | Freq: Two times a day (BID) | ORAL | 0 refills | Status: AC

## 2017-04-24 MED ORDER — TRAMADOL HCL 50 MG PO TABS: 50.0000 mg | ORAL_TABLET | Freq: Four times a day (QID) | ORAL | 0 refills | Status: AC | PRN

## 2017-04-24 MED ORDER — SACCHAROMYCES BOULARDII 250 MG PO CAPS: 250.0000 mg | ORAL_CAPSULE | Freq: Two times a day (BID) | ORAL | Status: DC

## 2017-04-24 MED ORDER — MAGNESIUM SULFATE 4 GM/100ML IV SOLN
4.0000 g | Freq: Once | INTRAVENOUS | Status: AC
  Administered 2017-04-24: 4 g via INTRAVENOUS
  Filled 2017-04-24: qty 100

## 2017-04-24 MED ORDER — LISINOPRIL-HYDROCHLOROTHIAZIDE 20-12.5 MG PO TABS: 1.0000 | ORAL_TABLET | Freq: Every day | ORAL | 0 refills | Status: DC

## 2017-04-24 NOTE — Discharge Summary (Signed)
Physician Discharge Summary  Debbie [name] JHE:[id number] DOB: 12/15/1950 DOA: 04/22/2017  PCP: Harlan Stains, MD  Admit date: 04/22/2017 Discharge date: 04/24/2017  Time spent: 65 minutes  Recommendations for Outpatient Follow-up:  1. Follow-up with Dr. Carlean Purl, gastroenterology in 2-3 weeks. This is patient's second episode of acute diverticulitis this year. May need referral to general surgery. Patient may need further evaluation. 2. Follow-up with Harlan Stains, MD in 1-2 weeks. On follow-up patient will need a basic metabolic profile, magnesium level to follow-up on electrolytes and renal function. CBC will need to be obtained to follow-up on H&H. Patient may benefit from oral iron supplementation.   Discharge Diagnoses:  Principal Problem:   Diverticulitis large intestine w/o perforation or abscess w/bleeding Active Problems:   Seasonal and perennial allergic rhinitis   Hypertension   Hypercholesteremia   Anemia   Hypokalemia   Orthostatic hypotension   Discharge Condition: Stable and improved.  Diet recommendation: Heart healthy/soft diet  Filed Weights   04/22/17 2105  Weight: 75.2 kg (165 lb 12.8 oz)    History of present illness:  Per Dr Derrell Lolling Debbie [name] is a 66 y.o. female with medical history significant of Diverticulitis, has had "a couple" of episodes since [facility] for this in 2013.  But always was treated with cipro/flagyl as outpatient successfully until now.  Patient's abd pain onset several weeks ago, treated with Cipro/flagyl by PCP, symptoms initially improved but didn't fully resolve and ultimately after finishing treatment she started having worsening pain.  Went to walk in clinic on day of admission, who sent her to ED due to worsening LLQ abd pain.  They also noted heme positive stool at walk in clinic.   ED Course: Orthostatic with BP drop to 80s on standing.  WBC 14k, CT confirms uncomplicated diverticulitis.  Given 1.25 L NS  bolus thus far.  Creat 1.4.  Patient started on zosyn in ED.  Hospital Course:  #1 Recurrent acute diverticulitis Patient presented with a bout of acute diverticulitis that had failed outpatient treatment. Patient Was placed on IV fluids, empiric IV Zosyn, pain management and initially on clear liquids. Patient was monitored. Patient improved clinically on a daily basis with resolution of leukocytosis. Patient's abdominal pain improved. Patient's diet was advanced to a soft diet which she tolerated. Patient will be discharged in stable and improved condition on 9 more days of oral Augmentin to complete a ten-day course of antibiotic treatment. Patient is to follow-up with her gastroenterologist, Dr. Carlean Purl in 2-3 weeks. Patient is patient's second episode of diverticulitis this year. Patient may need to be evaluated by general surgery however will defer to gastroenterology.   #2 hypokalemia/hypomagnesemia Repleted.  #3 orthostatic hypotension On admission patient was noted to be orthostatic with systolic blood pressures in the 80s on standing. Likely secondary to hypovolemia/dehydration. Patient was hydrated on IV fluids and was euvolemic by day of discharge. Outpatient follow-up.   #4 iron deficient anemia It was noted per admitted into the patient's FOBT was positive at PCPs office. Patient denied any overt bleeding. Hemoglobin stabilized at 11.5 from 13.9 on admission. Likely impart due to a dilutional effect. Anemia panel consistent with iron deficiency anemia. Outpatient follow-up with PCP. Patient may benefit from oral iron supplementation.  #5 hyperlipidemia Continued on statin.  #6 allergies Patient placed on Claritin.   #7 hypertension On admission patient was noted to be orthostatic on standing up and a such antihypertensive medications were held. Patient was hydrated with  IV fluids and was euvolemic by day of discharge. Patient's antihypertensive medications will be resumed 2  days post discharge. Outpatient follow-up.   Procedures:  CT abdomen and pelvis 04/22/2017  Consultations:  None  Discharge Exam: Vitals:   04/24/17 0835 04/24/17 1403  BP:  128/62  Pulse:    Resp: 17 18  Temp: 97.9 F (36.6 C) 97.9 F (36.6 C)    General: NAD Cardiovascular: RRR Respiratory: CTAB  Discharge Instructions   Discharge Instructions    Diet - low sodium heart healthy    Complete by:  As directed    Soft diet   Increase activity slowly    Complete by:  As directed      Current Discharge Medication List    START taking these medications   Details  amoxicillin-clavulanate (AUGMENTIN) 875-125 MG tablet Take 1 tablet by mouth 2 (two) times daily. Take for 9 days then stop. Qty: 18 tablet, Refills: 0    saccharomyces boulardii (FLORASTOR) 250 MG capsule Take 1 capsule (250 mg total) by mouth 2 (two) times daily.    traMADol (ULTRAM) 50 MG tablet Take 1-2 tablets (50-100 mg total) by mouth every 6 (six) hours as needed. Qty: 15 tablet, Refills: 0      CONTINUE these medications which have CHANGED   Details  lisinopril-hydrochlorothiazide (PRINZIDE,ZESTORETIC) 20-12.5 MG tablet Take 1 tablet by mouth daily. Refills: 0      CONTINUE these medications which have NOT CHANGED   Details  amLODipine (NORVASC) 5 MG tablet Take 5 mg by mouth daily.    Cholecalciferol (VITAMIN D-3) 1000 units CAPS Take 1,000 Units by mouth daily.    Choline Fenofibrate (TRILIPIX) 45 MG capsule Take 45 mg by mouth every morning.     CYMBALTA 60 MG capsule Take 120 mg by mouth every morning.     dicyclomine (BENTYL) 20 MG tablet Take 40 mg by mouth 2 (two) times daily. Refills: 2    fluticasone (FLONASE) 50 MCG/ACT nasal spray Place 2 sprays into both nostrils daily as needed for allergies or rhinitis.     KLOR-CON M20 20 MEQ tablet Take 40 mEq by mouth every morning.     levocetirizine (XYZAL) 5 MG tablet Take 5 mg by mouth every morning.    Magnesium 500 MG TABS  Take 500 mg by mouth at bedtime.    Multiple Vitamin (MULTIVITAMIN) capsule Take 1 capsule by mouth every morning.     pantoprazole (PROTONIX) 40 MG tablet Take 40 mg by mouth 2 (two) times daily. Refills: 3    simvastatin (ZOCOR) 40 MG tablet Take 1 tablet by mouth every evening.        No Known Allergies Follow-up Information    Harlan Stains, MD. Schedule an appointment as soon as possible for a visit in 2 week(s).   Specialty:  Family Medicine Why:  f/u in 1-2 weeks. Contact information: [place] [address] [phone]        Gatha Mayer, MD. Schedule an appointment as soon as possible for a visit in 2 week(s).   Specialty:  Gastroenterology Why:  f/u in 2-3 weeks. Contact information: [address] [place] [address] [phone]            The results of significant diagnostics from this hospitalization (including imaging, microbiology, ancillary and laboratory) are listed below for reference.    Significant Diagnostic Studies: Ct Abdomen Pelvis W Contrast  Result Date: 04/22/2017 CLINICAL DATA:  Left lower  quadrant pain 1 week with some dizziness. Positive rectal bleeding and hypotension. EXAM: CT ABDOMEN AND PELVIS WITH CONTRAST TECHNIQUE: Multidetector CT imaging of the abdomen and pelvis was performed using the standard protocol following bolus administration of intravenous contrast. CONTRAST:  11mL ISOVUE-300 IOPAMIDOL (ISOVUE-300) INJECTION 61% COMPARISON:  03/05/2014 and 10/16/2012 FINDINGS: Lower chest: Some linear bibasilar atelectasis. Small to moderate hiatal hernia. Hepatobiliary: 4.9 cm cyst over the right lobe of the liver without significant change. Gallbladder and biliary tree are within normal. Pancreas: Within normal. Spleen: Within normal. Adrenals/Urinary Tract: Adrenal glands are normal. Kidneys are normal in size without hydronephrosis or nephrolithiasis. Subcentimeter hypodensity over the mid pole  right kidney unchanged and likely a cyst but too small to characterize. Ureters and bladder are normal. Stomach/Bowel: Hiatal hernia as described above. Small bowel is within normal. Appendix is normal. Diverticulosis of the colon most prominent over the sigmoid colon. There is inflammatory change adjacent a short segment of the sigmoid colon in the left pelvis compatible acute diverticulitis. Small amount of free fluid in the pelvis. No evidence of perforation or abscess. No significant adjacent adenopathy. Vascular/Lymphatic: Mild calcified plaque over the abdominal aorta. Remaining vascular structures are within normal. No adenopathy. Reproductive: Previous hysterectomy.  Ovaries within normal. Other: None. Musculoskeletal: Degenerative change of the spine and hips. IMPRESSION: Evidence of acute diverticulitis involving a short segment of the sigmoid colon in the left pelvis. No evidence of perforation or abscess. 4.9 cm right liver cyst unchanged. Subcentimeter right renal hypodensity unchanged and too small to characterize but likely a cyst. Mild aortic atherosclerosis. Electronically Signed   By: Marin Olp M.D.   On: 04/22/2017 18:30    Microbiology: No results found for this or any previous visit (from the past 240 hour(s)).   Labs: Basic Metabolic Panel:  Recent Labs Lab 04/22/17 1534 04/23/17 0434 04/24/17 0553  NA 137 138 140  K 4.5 3.4* 3.5  CL 102 104 106  CO2 23 28 25   GLUCOSE 97 106* 84  BUN 14 14 5*  CREATININE 1.40* 1.02* 0.61  CALCIUM 9.4 8.3* 8.7*  MG  --   --  1.6*   Liver Function Tests:  Recent Labs Lab 04/22/17 1534  AST 26  ALT 17  ALKPHOS 76  BILITOT 0.8  PROT 7.4  ALBUMIN 4.1    Recent Labs Lab 04/22/17 1534  LIPASE 29   No results for input(s): AMMONIA in the last 168 hours. CBC:  Recent Labs Lab 04/22/17 1534 04/23/17 0434 04/24/17 0553  WBC 14.6* 9.0 6.3  NEUTROABS  --   --  3.5  HGB 13.9 10.9* 11.5*  HCT 42.2 33.5* 35.0*  MCV 85.3  85.2 84.7  PLT 353 300 291   Cardiac Enzymes: No results for input(s): CKTOTAL, CKMB, CKMBINDEX, TROPONINI in the last 168 hours. BNP: BNP (last 3 results) No results for input(s): BNP in the last 8760 hours.  ProBNP (last 3 results) No results for input(s): PROBNP in the last 8760 hours.  CBG: No results for input(s): GLUCAP in the last 168 hours.     SignedIrine Seal MD.  [facility] 04/24/2017, 4:45 PM

## 2017-04-24 NOTE — Progress Notes (Signed)
Patient discharged to home with instructions and prescriptions. 

## 2017-04-24 NOTE — Care Management Note (Signed)
Case Management Note  Patient Details  Name: Debbie [name] MRN: [id number] Date of Birth: 1950-12-25  Subjective/Objective:                    Action/Plan:  5-21 Diverticulitis if tol soft diet , DC today   Expected Discharge Date:  04/25/17               Expected Discharge Plan:  Home/Self Care  In-House Referral:     Discharge planning Services     Post Acute Care Choice:    Choice offered to:     DME Arranged:    DME Agency:     HH Arranged:    [place] Agency:     Status of Service:  Completed, signed off  If discussed at [employer] of Stay Meetings, dates discussed:    Additional Comments:  Marilu Favre, RN 04/24/2017, 10:30 AM

## 2017-04-25 ENCOUNTER — Other Ambulatory Visit: Payer: BLUE CROSS/BLUE SHIELD

## 2017-05-02 ENCOUNTER — Other Ambulatory Visit: Payer: BLUE CROSS/BLUE SHIELD

## 2017-05-08 ENCOUNTER — Other Ambulatory Visit: Payer: BLUE CROSS/BLUE SHIELD

## 2017-12-22 ENCOUNTER — Telehealth: Payer: Self-pay

## 2017-12-22 NOTE — Telephone Encounter (Signed)
SENT REFERRAL TO SCHEDULING 

## 2018-02-09 ENCOUNTER — Encounter (INDEPENDENT_AMBULATORY_CARE_PROVIDER_SITE_OTHER): Payer: Self-pay

## 2018-02-09 ENCOUNTER — Ambulatory Visit: Payer: BLUE CROSS/BLUE SHIELD | Admitting: Cardiology

## 2018-02-09 ENCOUNTER — Encounter: Payer: Self-pay | Admitting: Cardiology

## 2018-02-09 VITALS — BP 139/87 | HR 81 | Ht 63.0 in | Wt 171.2 lb

## 2018-02-09 DIAGNOSIS — I10 Essential (primary) hypertension: Secondary | ICD-10-CM

## 2018-02-09 DIAGNOSIS — R079 Chest pain, unspecified: Secondary | ICD-10-CM

## 2018-02-09 DIAGNOSIS — E7800 Pure hypercholesterolemia, unspecified: Secondary | ICD-10-CM

## 2018-02-09 DIAGNOSIS — R0602 Shortness of breath: Secondary | ICD-10-CM

## 2018-02-09 MED ORDER — METOPROLOL TARTRATE 50 MG PO TABS: 50.0000 mg | ORAL_TABLET | Freq: Two times a day (BID) | ORAL | 0 refills | Status: DC

## 2018-02-09 NOTE — Progress Notes (Signed)
Cardiology Office Note    Date:  02/09/2018   ID:  Debbie [name], DOB 1951-10-18, MRN [id number]  PCP:  Harlan Stains, MD  Cardiologist:  Fransico Him, MD   Chief Complaint  Patient presents with  . Chest Pain  . Shortness of Breath    History of Present Illness:  Debbie [name] is a 67 y.o. female who is being seen today for the evaluation of chest pain at the request of Harlan Stains, MD.  This is a 67 year old white female with a history of GERD, esophageal stricture, hiatal hernia with Schatzki's ring, hyperlipidemia, hypertension and family history of heart disease who recently presented to her PCP with complaints of chest heaviness and shortness of breath with exertion for the past several months.  This usually occurs when she is doing activities such as vacuuming or laundry or carrying groceries in.  Her episodes resolve after sitting down for 10-20 minutes.  Usually lasts for about 5 minutes.  Her discomfort does not happen every time she does these activities.  An EKG done at her PCP office on 12/21/2017 showed normal sinus rhythm with diffuse T wave abnormality in the anterior precordial leads as well as lateral leads.  She is here for further evaluation.  Her last hemoglobin A1c was 6 and LDL 106 with total cholesterol 214.  Past Medical History:  Diagnosis Date  . Acid reflux   . ALLERGIC RHINITIS   . Depression   . Diverticulosis   . Esophageal stricture   . Hiatal hernia    with  Schatzki's Ring  . Hyperlipidemia   . Hypertension   . IBS (irritable bowel syndrome)   . Kidney stones     Past Surgical History:  Procedure Laterality Date  . ABDOMINAL HYSTERECTOMY    . ANKLE FRACTURE SURGERY  2012   right  . COLONOSCOPY  multiple  . ESOPHAGOGASTRODUODENOSCOPY  09/14/05   multiple  . KIDNEY STONE SURGERY    . TONSILLECTOMY    . TUBAL LIGATION    . VESICOVAGINAL FISTULA CLOSURE W/ TAH      Current Medications: Current Meds  Medication Sig  . amLODipine  (NORVASC) 5 MG tablet Take 5 mg by mouth daily.  . Cholecalciferol (VITAMIN D-3) 1000 units CAPS Take 1,000 Units by mouth daily.  . Choline Fenofibrate (TRILIPIX) 45 MG capsule Take 45 mg by mouth every morning.   . CYMBALTA 60 MG capsule Take 120 mg by mouth every morning.   . dicyclomine (BENTYL) 20 MG tablet Take 40 mg by mouth 2 (two) times daily.  . fluticasone (FLONASE) 50 MCG/ACT nasal spray Place 2 sprays into both nostrils daily as needed for allergies or rhinitis.   Marland Kitchen KLOR-CON M20 20 MEQ tablet Take 40 mEq by mouth every morning.   Marland Kitchen levocetirizine (XYZAL) 5 MG tablet Take 5 mg by mouth every morning.  Marland Kitchen lisinopril-hydrochlorothiazide (PRINZIDE,ZESTORETIC) 20-12.5 MG tablet Take 1 tablet by mouth daily.  . Magnesium 500 MG TABS Take 500 mg by mouth at bedtime.  . Multiple Vitamin (MULTIVITAMIN) capsule Take 1 capsule by mouth every morning.   . pantoprazole (PROTONIX) 40 MG tablet Take 40 mg by mouth 2 (two) times daily.  Marland Kitchen saccharomyces boulardii (FLORASTOR) 250 MG capsule Take 1 capsule (250 mg total) by mouth 2 (two) times daily.  . simvastatin (ZOCOR) 40 MG tablet Take 1 tablet by mouth every evening.   . traMADol (ULTRAM) 50 MG tablet Take 1-2 tablets (50-100 mg total) by mouth every 6 (  six) hours as needed.    Allergies:   Patient has no known allergies.   Social History   Socioeconomic History  . Marital status: Married    Spouse name: None  . Number of children: 3  . Years of education: None  . Highest education level: None  Social Needs  . Financial resource strain: None  . Food insecurity - worry: None  . Food insecurity - inability: None  . Transportation needs - medical: None  . Transportation needs - non-medical: None  Occupational History  . 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 Occupation: CUST Financial controller: [employer]  Tobacco Use  . Smoking status: Never Smoker  . Smokeless tobacco: Never Used  Substance  and Sexual Activity  . Alcohol use: No  . Drug use: No  . Sexual activity: No  Other Topics Concern  . None  Social History Narrative  . None     Family History:  The patient's family history includes Allergies in her daughter, mother, and son; Diabetes in her paternal grandmother; Heart disease in her father and mother; Hyperlipidemia in her father and mother; Hypertension in her father and mother; Stroke in her father.   ROS:   Please see the history of present illness.    ROS All other systems reviewed and are negative.  No flowsheet data found.     PHYSICAL EXAM:   VS:  BP 139/87   Pulse 81   Ht 5\' 3"  (1.6 m)   Wt 171 lb 3.2 oz (77.7 kg)   BMI 30.33 kg/m    GEN: Well nourished, well developed, in no acute distress  HEENT: normal  Neck: no JVD, carotid bruits, or masses Cardiac: RRR; no murmurs, rubs, or gallops,no edema.  Intact distal pulses bilaterally.  Respiratory:  clear to auscultation bilaterally, normal work of breathing GI: soft, nontender, nondistended, + BS MS: no deformity or atrophy  Skin: warm and dry, no rash Neuro:  Alert and Oriented x 3, Strength and sensation are intact Psych: euthymic mood, full affect  Wt Readings from Last 3 Encounters:  02/09/18 171 lb 3.2 oz (77.7 kg)  04/22/17 165 lb 12.8 oz (75.2 kg)  09/03/13 174 lb 12.8 oz (79.3 kg)      Studies/Labs Reviewed:   EKG:  EKG is ordered today.  The ekg ordered today demonstrates NSR at 81bpm with nonspecific ST abnromality  Recent Labs: 04/22/2017: ALT 17 04/24/2017: BUN 5; Creatinine, Ser 0.61; Hemoglobin 11.5; Magnesium 1.6; Platelets 291; Potassium 3.5; Sodium 140   Lipid Panel No results found for: CHOL, TRIG, HDL, CHOLHDL, VLDL, LDLCALC, LDLDIRECT  Additional studies/ records that were reviewed today include:  Office notes from PCP    ASSESSMENT:    1. Chest pain, unspecified type   2. Essential hypertension   3. Hypercholesteremia   4. SOB (shortness of breath)       PLAN:  In order of problems listed above:  1. Chest pain  - she has some typical and atypical symptoms to her chest pain.  It does occur with exertion and resolves with rest and is associated with shortness of breath and some diaphoresis and nausea.  It does not occur every time she exerts herself and not every day.  She does have cardiac risk factors including hypertension, hyperlipidemia, exposure to secondhand smoke as a child as well as a family history of CAD.  Her initial EKG done in her PCP office  showed diffuse T wave abnormality in the anterior and lateral leads.  Her EKG today shows more nonspecific T wave changes.  I have recommended that we get a coronary CTA with morphology, calcium score and FFR to further assess for coronary disease.  If this shows moderate to significant obstructive disease she will need cardiac catheterization.  2.  Dyspnea on exertion -I recommended we get a 2D echocardiogram to assess LV function.  We can also look at valvular heart disease although I do not hear any murmurs on exam.  3.  Hypertension -blood pressure is well controlled on exam today.  She will continue on lisinopril HCT 20/12.5 mg daily and amlodipine 5 mg daily.  4.  Hyperlipidemia -her last LDL was 106.  She will continue on simvastatin 40 mg daily.  If she does have calcium or CAD on CT she will need to have more aggressive treatment of her LDL with a goal of less than 70.    Medication Adjustments/Labs and Tests Ordered: Current medicines are reviewed at length with the patient today.  Concerns regarding medicines are outlined above.  Medication changes, Labs and Tests ordered today are listed in the Patient Instructions below.  There are no Patient Instructions on file for this visit.   Signed, Fransico Him, MD  02/09/2018 2:05 PM    [facility] [address], [place], [place]  [address] Phone: [phone]; Fax: [phone]

## 2018-02-09 NOTE — Addendum Note (Signed)
Addended by: Teressa Senter on: 02/09/2018 02:16 PM   Modules accepted: Orders

## 2018-02-09 NOTE — Patient Instructions (Addendum)
Medication Instructions:  Your physician recommends that you continue on your current medications as directed. Please refer to the Current Medication list given to you today.  If you need a refill on your cardiac medications, please contact your pharmacy first.  Labwork: Today for BMET   Testing/Procedures: Your physician has requested that you have an echocardiogram. Echocardiography is a painless test that uses sound waves to create images of your heart. It provides your doctor with information about the size and shape of your heart and how well your heart's chambers and valves are working. This procedure takes approximately one hour. There are no restrictions for this procedure.  Your physician has requested that you have cardiac CT. Cardiac computed tomography (CT) is a painless test that uses an x-ray machine to take clear, detailed pictures of your heart. For further information please visit HugeFiesta[employer]tn. Please follow instruction sheet as given.  Please arrive at the [facility] main entrance of [facility] at (30-45 minutes prior to test start time)  [facility] [address] [place], [place] [address] [phone]  Proceed to the [facility] Radiology Department (First Floor).  Please follow these instructions carefully (unless otherwise directed):   On the Night Before the Test: . Drink plenty of water. . Do not consume any caffeinated/decaffeinated beverages or chocolate 12 hours prior to your test. . Do not take any antihistamines 12 hours prior to your test.  On the Day of the Test: . Drink plenty of water. Do not drink any water within one hour of the test. . Do not eat any food 4 hours prior to the test. . You may take your regular medications prior to the test. . IF NOT ON A BETA BLOCKER - Take 50 mg of lopressor (metoprolol) one hour before the test.  After the Test: . Drink plenty of water. . After receiving IV contrast, you may  experience a mild flushed feeling. This is normal. . On occasion, you may experience a mild rash up to 24 hours after the test. This is not dangerous. If this occurs, you can take Benadryl 25 mg and increase your fluid intake. . If you experience trouble breathing, this can be serious. If it is severe call 911 IMMEDIATELY. If it is mild, please call our office. . If you take any of these medications: Glipizide/Metformin, Avandament, Glucavance, please do not take 48 hours after completing test.   Follow-Up: Your physician wants you to follow-up as needed with Dr. Radford Pax   Any Other Special Instructions Will Be Listed Below (If Applicable).   Thank you for choosing [facility], RN  [phone]  If you need a refill on your cardiac medications before your next appointment, please call your pharmacy.

## 2018-02-20 ENCOUNTER — Other Ambulatory Visit: Payer: Self-pay

## 2018-02-20 ENCOUNTER — Ambulatory Visit (HOSPITAL_COMMUNITY): Payer: BLUE CROSS/BLUE SHIELD | Attending: Cardiology

## 2018-02-20 DIAGNOSIS — E785 Hyperlipidemia, unspecified: Secondary | ICD-10-CM | POA: Insufficient documentation

## 2018-02-20 DIAGNOSIS — R0602 Shortness of breath: Secondary | ICD-10-CM

## 2018-02-20 DIAGNOSIS — I951 Orthostatic hypotension: Secondary | ICD-10-CM | POA: Diagnosis not present

## 2018-02-20 DIAGNOSIS — I10 Essential (primary) hypertension: Secondary | ICD-10-CM | POA: Insufficient documentation

## 2018-02-22 ENCOUNTER — Other Ambulatory Visit: Payer: Self-pay

## 2018-02-22 DIAGNOSIS — Z79899 Other long term (current) drug therapy: Secondary | ICD-10-CM

## 2018-02-22 NOTE — Progress Notes (Signed)
I stat creatinine ordered for cardiac CTA

## 2018-02-26 ENCOUNTER — Ambulatory Visit (HOSPITAL_COMMUNITY): Admission: RE | Admit: 2018-02-26 | Payer: BLUE CROSS/BLUE SHIELD | Source: Ambulatory Visit

## 2018-02-26 ENCOUNTER — Encounter (HOSPITAL_COMMUNITY): Payer: Self-pay

## 2018-02-26 ENCOUNTER — Ambulatory Visit (HOSPITAL_COMMUNITY)
Admission: RE | Admit: 2018-02-26 | Discharge: 2018-02-26 | Disposition: A | Discharge status: Home / Self Care | Payer: BLUE CROSS/BLUE SHIELD | Source: Ambulatory Visit | Attending: Cardiology | Admitting: Cardiology

## 2018-02-26 DIAGNOSIS — K449 Diaphragmatic hernia without obstruction or gangrene: Secondary | ICD-10-CM | POA: Diagnosis not present

## 2018-02-26 DIAGNOSIS — I2510 Atherosclerotic heart disease of native coronary artery without angina pectoris: Secondary | ICD-10-CM | POA: Insufficient documentation

## 2018-02-26 DIAGNOSIS — R911 Solitary pulmonary nodule: Secondary | ICD-10-CM | POA: Insufficient documentation

## 2018-02-26 DIAGNOSIS — R079 Chest pain, unspecified: Secondary | ICD-10-CM | POA: Diagnosis present

## 2018-02-26 DIAGNOSIS — R0789 Other chest pain: Secondary | ICD-10-CM | POA: Diagnosis not present

## 2018-02-26 LAB — POCT I-STAT CREATININE: Creatinine, Ser: 0.6 mg/dL (ref 0.44–1.00)

## 2018-02-26 MED ORDER — NITROGLYCERIN 0.4 MG SL SUBL
SUBLINGUAL_TABLET | SUBLINGUAL | Status: AC
  Administered 2018-02-26: 0.8 mg via SUBLINGUAL
  Filled 2018-02-26: qty 1

## 2018-02-26 MED ORDER — IOPAMIDOL (ISOVUE-370) INJECTION 76%
INTRAVENOUS | Status: AC
  Administered 2018-02-26: 80 mL
  Filled 2018-02-26: qty 100

## 2018-02-26 MED ORDER — METOPROLOL TARTRATE 5 MG/5ML IV SOLN
5.0000 mg | INTRAVENOUS | Status: DC | PRN
  Administered 2018-02-26: 5 mg via INTRAVENOUS

## 2018-02-26 MED ORDER — METOPROLOL TARTRATE 5 MG/5ML IV SOLN
INTRAVENOUS | Status: AC
  Administered 2018-02-26: 5 mg via INTRAVENOUS
  Filled 2018-02-26: qty 10

## 2018-02-26 MED ORDER — NITROGLYCERIN 0.4 MG SL SUBL
0.8000 mg | SUBLINGUAL_TABLET | SUBLINGUAL | Status: DC | PRN
  Administered 2018-02-26: 0.8 mg via SUBLINGUAL

## 2018-02-28 ENCOUNTER — Telehealth: Payer: Self-pay | Admitting: Cardiology

## 2018-02-28 NOTE — Telephone Encounter (Signed)
New message    Patient returning call to nurse for lab resuls

## 2018-02-28 NOTE — Telephone Encounter (Signed)
Spoke with patient and informed her of lab results and recommendations per Dr Radford Pax.Marland Kitchen

## 2018-03-02 ENCOUNTER — Telehealth: Payer: Self-pay

## 2018-03-02 DIAGNOSIS — R079 Chest pain, unspecified: Secondary | ICD-10-CM

## 2018-03-02 NOTE — Telephone Encounter (Signed)
Notes recorded by Teressa Senter, RN on 03/02/2018 at 2:44 PM EDT Patient informed to follow up with PCP regarding RML lung nodule and large hiatal hernia. She verbalized understanding ------  Notes recorded by Teressa Senter, RN on 03/02/2018 at 2:43 PM EDT Patient made aware of CT results and Dr. Theodosia Blender recommendation for stress myoview. Patient is in agreement with plan and thankful for the call. 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 The RCA is large and gives off a LCx and OM branches. LAD is small with anterior course anterior to PA. Minimal plaque 0-25% in RCA. Low Ca score at 24. Discussed with CT surgery and given anterior course of LAD likely not to be etiology of sx. Please get a stress myovew

## 2018-03-08 ENCOUNTER — Telehealth (HOSPITAL_COMMUNITY): Payer: Self-pay | Admitting: *Deleted

## 2018-03-08 NOTE — Telephone Encounter (Signed)
Patient given detailed instructions per Myocardial Perfusion Study Information Sheet for the test on 03/13/18. Patient notified to arrive 15 minutes early and that it is imperative to arrive on time for appointment to keep from having the test rescheduled.  If you need to cancel or reschedule your appointment, please call the office within 24 hours of your appointment. . Patient verbalized understanding. Kirstie Peri

## 2018-03-13 ENCOUNTER — Ambulatory Visit (HOSPITAL_COMMUNITY): Payer: BLUE CROSS/BLUE SHIELD | Attending: Cardiology

## 2018-03-13 DIAGNOSIS — R079 Chest pain, unspecified: Secondary | ICD-10-CM | POA: Diagnosis not present

## 2018-03-13 MED ORDER — TECHNETIUM TC 99M TETROFOSMIN IV KIT
32.6000 | PACK | Freq: Once | INTRAVENOUS | Status: AC | PRN
  Administered 2018-03-13: 32.6 via INTRAVENOUS
  Filled 2018-03-13: qty 33

## 2018-03-14 ENCOUNTER — Ambulatory Visit (HOSPITAL_COMMUNITY): Payer: BLUE CROSS/BLUE SHIELD | Attending: Cardiovascular Disease

## 2018-03-14 LAB — MYOCARDIAL PERFUSION IMAGING
CHL CUP NUCLEAR SDS: 0
CHL CUP NUCLEAR SSS: 2
CSEPEDS: 30 s
CSEPEW: 9.3 METS
CSEPHR: 87 %
CSEPPHR: 137 {beats}/min
Exercise duration (min): 7 min
LHR: 0.43
LV dias vol: 80 mL (ref 46–106)
LV sys vol: 32 mL
MPHR: 156 {beats}/min
Rest HR: 83 {beats}/min
SRS: 2
TID: 0.83

## 2018-03-14 MED ORDER — TECHNETIUM TC 99M TETROFOSMIN IV KIT
32.5000 | PACK | Freq: Once | INTRAVENOUS | Status: AC | PRN
  Administered 2018-03-14: 32.5 via INTRAVENOUS
  Filled 2018-03-14: qty 33

## 2018-08-21 DIAGNOSIS — K5900 Constipation, unspecified: Secondary | ICD-10-CM | POA: Diagnosis not present

## 2018-08-21 DIAGNOSIS — Z23 Encounter for immunization: Secondary | ICD-10-CM | POA: Diagnosis not present

## 2018-08-21 DIAGNOSIS — I10 Essential (primary) hypertension: Secondary | ICD-10-CM | POA: Diagnosis not present

## 2018-08-21 DIAGNOSIS — F324 Major depressive disorder, single episode, in partial remission: Secondary | ICD-10-CM | POA: Diagnosis not present

## 2018-08-21 DIAGNOSIS — F4321 Adjustment disorder with depressed mood: Secondary | ICD-10-CM | POA: Diagnosis not present

## 2018-08-21 DIAGNOSIS — R7303 Prediabetes: Secondary | ICD-10-CM | POA: Diagnosis not present

## 2018-08-21 DIAGNOSIS — E785 Hyperlipidemia, unspecified: Secondary | ICD-10-CM | POA: Diagnosis not present

## 2018-08-21 DIAGNOSIS — E559 Vitamin D deficiency, unspecified: Secondary | ICD-10-CM | POA: Diagnosis not present

## 2018-10-08 ENCOUNTER — Ambulatory Visit (HOSPITAL_COMMUNITY)
Admission: EM | Admit: 2018-10-08 | Discharge: 2018-10-08 | Disposition: A | Discharge status: Home / Self Care | Payer: Medicare HMO | Attending: Family Medicine | Admitting: Family Medicine

## 2018-10-08 ENCOUNTER — Encounter (HOSPITAL_COMMUNITY): Payer: Self-pay | Admitting: Emergency Medicine

## 2018-10-08 DIAGNOSIS — J320 Chronic maxillary sinusitis: Secondary | ICD-10-CM | POA: Diagnosis not present

## 2018-10-08 MED ORDER — AMOXICILLIN-POT CLAVULANATE 875-125 MG PO TABS: 1.0000 | ORAL_TABLET | Freq: Two times a day (BID) | ORAL | 0 refills | Status: DC

## 2018-10-08 NOTE — ED Triage Notes (Signed)
Pt c/o L ear pain, facial pain and pressure, pt states she has had hx of a lot of sinus infections, states shes having a lot of drainage in her throat. Symptoms x1 week.

## 2018-10-08 NOTE — Discharge Instructions (Signed)
We will go ahead and treat you for a sinus infection with Augmentin Continue the Flonase and xyzal  Follow up as needed for continued or worsening symptoms

## 2018-10-08 NOTE — ED Provider Notes (Signed)
[place]    CSN: [id number] Arrival date & time: 10/08/18  1497     History   Chief Complaint Chief Complaint  Patient presents with  . URI    HPI Debbie [name] is a 67 y.o. female.   Pt is a 66 year old female that presents with 7 days of sinus congestion, facial pressure,mucous,  dental pain and fatigue. Symptoms have been constant and worsening. She had been using her xyzal, Flonase and mucinex without much relief of symptoms. She does have hx of chronic sinusitis. No fever, chills, body aches. Denies any cough, ear pain, sore throat.    ROS per HPI      Past Medical History:  Diagnosis Date  . Acid reflux   . ALLERGIC RHINITIS   . Depression   . Diverticulosis   . Esophageal stricture   . Hiatal hernia    with  Schatzki's Ring  . Hyperlipidemia   . Hypertension   . IBS (irritable bowel syndrome)   . Kidney stones     Patient Active Problem List   Diagnosis Date Noted  . Chest pain 02/09/2018  . SOB (shortness of breath) 02/09/2018  . Anemia 04/23/2017  . Hypokalemia 04/23/2017  . Orthostatic hypotension 04/23/2017  . Diverticulitis large intestine w/o perforation or abscess w/bleeding 04/22/2017  . Hypertension 08/30/2012  . Hypercholesteremia 08/30/2012  . Seasonal and perennial allergic rhinitis 08/18/2012  . Acute recurrent maxillary sinusitis 08/18/2012  . Asthma with bronchitis 08/18/2012    Past Surgical History:  Procedure Laterality Date  . ABDOMINAL HYSTERECTOMY    . ANKLE FRACTURE SURGERY  2012   right  . COLONOSCOPY  multiple  . ESOPHAGOGASTRODUODENOSCOPY  09/14/05   multiple  . KIDNEY STONE SURGERY    . TONSILLECTOMY    . TUBAL LIGATION    . VESICOVAGINAL FISTULA CLOSURE W/ TAH      OB History    Gravida  3   Para  3   Term  3   Preterm      AB      Living  3     SAB      TAB      Ectopic      Multiple      Live Births               Home Medications    Prior to Admission medications    Medication Sig Start Date End Date Taking? Authorizing Provider  amLODipine (NORVASC) 5 MG tablet Take 5 mg by mouth daily.    [provider]  amoxicillin-clavulanate (AUGMENTIN) 875-125 MG tablet Take 1 tablet by mouth every 12 (twelve) hours. 10/08/18   Loura Halt A, NP  Cholecalciferol (VITAMIN D-3) 1000 units CAPS Take 1,000 Units by mouth daily.    [provider]  Choline Fenofibrate (TRILIPIX) 45 MG capsule Take 45 mg by mouth every morning.     [provider]  CYMBALTA 60 MG capsule Take 120 mg by mouth every morning.  05/21/12   [provider]  dicyclomine (BENTYL) 20 MG tablet Take 40 mg by mouth 2 (two) times daily. 01/31/17   [provider]  fluticasone (FLONASE) 50 MCG/ACT nasal spray Place 2 sprays into both nostrils daily as needed for allergies or rhinitis.  12/13/13   [provider]  KLOR-CON M20 20 MEQ tablet Take 40 mEq by mouth every morning.  08/06/12   [provider]  levocetirizine (XYZAL) 5 MG tablet Take 5  mg by mouth every morning.    [provider]  lisinopril-hydrochlorothiazide (PRINZIDE,ZESTORETIC) 20-12.5 MG tablet Take 1 tablet by mouth daily. 04/26/17   Eugenie Filler, MD  Magnesium 500 MG TABS Take 500 mg by mouth at bedtime.    [provider]  metoprolol tartrate (LOPRESSOR) 50 MG tablet Take 1 tablet (50 mg total) by mouth 2 (two) times daily. 02/09/18   [name], MD  Multiple Vitamin (MULTIVITAMIN) capsule Take 1 capsule by mouth every morning.     [provider]  pantoprazole (PROTONIX) 40 MG tablet Take 40 mg by mouth 2 (two) times daily. 02/10/17   [provider]  saccharomyces boulardii (FLORASTOR) 250 MG capsule Take 1 capsule (250 mg total) by mouth 2 (two) times daily. 04/24/17   Eugenie Filler, MD  simvastatin (ZOCOR) 40 MG tablet Take 1 tablet by mouth every evening.  07/30/12   [provider]    Family History Family History    Problem Relation Age of Onset  . Allergies Mother   . Heart disease Mother        bypass surgery  . Hypertension Mother   . Hyperlipidemia Mother   . Heart disease Father        bypass surgery  . Hypertension Father   . Hyperlipidemia Father   . Stroke Father   . Allergies Daughter   . Allergies Son   . Diabetes Paternal Grandmother   . Colon cancer Neg Hx   . Esophageal cancer Neg Hx   . Rectal cancer Neg Hx   . Stomach cancer Neg Hx     Social History Social History   Tobacco Use  . Smoking status: Never Smoker  . Smokeless tobacco: Never Used  Substance Use Topics  . Alcohol use: No  . Drug use: No     Allergies   Patient has no known allergies.   Review of Systems Review of Systems   Physical Exam Triage Vital Signs ED Triage Vitals  Enc Vitals Group     BP 10/08/18 1043 111/76     Pulse Rate 10/08/18 1043 (!) 110     Resp 10/08/18 1043 18     Temp 10/08/18 1043 98.5 F (36.9 C)     Temp src --      SpO2 10/08/18 1043 96 %     Weight --      Height --      Head Circumference --      Peak Flow --      Pain Score 10/08/18 1044 7     Pain Loc --      Pain Edu? --      Excl. in [place]? --    No data found.  Updated Vital Signs BP 111/76   Pulse (!) 110   Temp 98.5 F (36.9 C)   Resp 18   SpO2 96%   Visual Acuity Right Eye Distance:   Left Eye Distance:   Bilateral Distance:    Right Eye Near:   Left Eye Near:    Bilateral Near:     Physical Exam  Constitutional: She appears well-developed and well-nourished.  Very pleasant. Non toxic or ill appearing.   HENT:  Head: Normocephalic and atraumatic.  Right Ear: External ear normal.  Left Ear: External ear normal.  Bilateral TMs normal.  External ears normal.  Without posterior oropharyngeal erythema, tonsillar swelling or exudates. No lesions.  Moderate nasal turbinate swelling with left sided maxillary and frontal  tenderness.  No lymphadenopathy.   Eyes: Conjunctivae are normal.   Neck: Normal range of motion.  Cardiovascular: Normal rate and normal heart sounds.  mildly tachycardic  Pulmonary/Chest: Effort normal and breath sounds normal.  Lungs clear in all fields. No dyspnea or distress. No retractions or nasal flaring.   Musculoskeletal: Normal range of motion.  Neurological: She is alert.  Skin: Skin is warm and dry.  Psychiatric: She has a normal mood and affect.  Nursing note and vitals reviewed.    UC Treatments / Results  Labs (all labs ordered are listed, but only abnormal results are displayed) Labs Reviewed - No data to display  EKG None  Radiology No results found.  Procedures Procedures (including critical care time)  Medications Ordered in UC Medications - No data to display  Initial Impression / Assessment and Plan / UC Course  I have reviewed the triage vital signs and the nursing notes.  Pertinent labs & imaging results that were available during my care of the patient were reviewed by me and considered in my medical decision making (see chart for details).     Chronic sinusitis- will treat with Augmentin.  Instructed her to continue the Flonase and xyzal.  Follow up as needed for continued or worsening symptoms  Final Clinical Impressions(s) / UC Diagnoses   Final diagnoses:  Chronic maxillary sinusitis     Discharge Instructions     We will go ahead and treat you for a sinus infection with Augmentin Continue the Flonase and xyzal  Follow up as needed for continued or worsening symptoms     ED Prescriptions    Medication Sig Dispense Auth. Provider   amoxicillin-clavulanate (AUGMENTIN) 875-125 MG tablet Take 1 tablet by mouth every 12 (twelve) hours. 14 tablet Loura Halt A, NP     Controlled Substance Prescriptions [place] Controlled Substance Registry consulted? Not Applicable   Orvan July, NP 10/08/18 1157

## 2018-10-30 DIAGNOSIS — J019 Acute sinusitis, unspecified: Secondary | ICD-10-CM | POA: Diagnosis not present

## 2018-12-14 DIAGNOSIS — N390 Urinary tract infection, site not specified: Secondary | ICD-10-CM | POA: Diagnosis not present

## 2019-03-07 DIAGNOSIS — E785 Hyperlipidemia, unspecified: Secondary | ICD-10-CM | POA: Diagnosis not present

## 2019-03-07 DIAGNOSIS — K219 Gastro-esophageal reflux disease without esophagitis: Secondary | ICD-10-CM | POA: Diagnosis not present

## 2019-03-07 DIAGNOSIS — I10 Essential (primary) hypertension: Secondary | ICD-10-CM | POA: Diagnosis not present

## 2019-03-07 DIAGNOSIS — F4321 Adjustment disorder with depressed mood: Secondary | ICD-10-CM | POA: Diagnosis not present

## 2019-03-07 DIAGNOSIS — F324 Major depressive disorder, single episode, in partial remission: Secondary | ICD-10-CM | POA: Diagnosis not present

## 2019-06-27 DIAGNOSIS — J019 Acute sinusitis, unspecified: Secondary | ICD-10-CM | POA: Diagnosis not present

## 2019-07-08 DIAGNOSIS — R1032 Left lower quadrant pain: Secondary | ICD-10-CM | POA: Diagnosis not present

## 2019-07-19 DIAGNOSIS — I10 Essential (primary) hypertension: Secondary | ICD-10-CM | POA: Diagnosis not present

## 2019-07-19 DIAGNOSIS — R0600 Dyspnea, unspecified: Secondary | ICD-10-CM | POA: Diagnosis not present

## 2019-07-19 DIAGNOSIS — R7303 Prediabetes: Secondary | ICD-10-CM | POA: Diagnosis not present

## 2019-07-19 DIAGNOSIS — R42 Dizziness and giddiness: Secondary | ICD-10-CM | POA: Diagnosis not present

## 2019-07-19 DIAGNOSIS — I951 Orthostatic hypotension: Secondary | ICD-10-CM | POA: Diagnosis not present

## 2019-07-19 DIAGNOSIS — R55 Syncope and collapse: Secondary | ICD-10-CM | POA: Diagnosis not present

## 2019-09-05 DIAGNOSIS — F324 Major depressive disorder, single episode, in partial remission: Secondary | ICD-10-CM | POA: Diagnosis not present

## 2019-09-05 DIAGNOSIS — I10 Essential (primary) hypertension: Secondary | ICD-10-CM | POA: Diagnosis not present

## 2019-09-05 DIAGNOSIS — E785 Hyperlipidemia, unspecified: Secondary | ICD-10-CM | POA: Diagnosis not present

## 2019-09-05 DIAGNOSIS — Z23 Encounter for immunization: Secondary | ICD-10-CM | POA: Diagnosis not present

## 2019-09-05 DIAGNOSIS — K219 Gastro-esophageal reflux disease without esophagitis: Secondary | ICD-10-CM | POA: Diagnosis not present

## 2019-09-05 DIAGNOSIS — Z0000 Encounter for general adult medical examination without abnormal findings: Secondary | ICD-10-CM | POA: Diagnosis not present

## 2019-09-05 DIAGNOSIS — E559 Vitamin D deficiency, unspecified: Secondary | ICD-10-CM | POA: Diagnosis not present

## 2019-09-05 DIAGNOSIS — R7303 Prediabetes: Secondary | ICD-10-CM | POA: Diagnosis not present

## 2019-09-05 DIAGNOSIS — G4710 Hypersomnia, unspecified: Secondary | ICD-10-CM | POA: Diagnosis not present

## 2019-09-19 ENCOUNTER — Other Ambulatory Visit: Payer: Self-pay

## 2019-09-19 ENCOUNTER — Encounter: Payer: Self-pay | Admitting: Cardiology

## 2019-09-19 ENCOUNTER — Ambulatory Visit (INDEPENDENT_AMBULATORY_CARE_PROVIDER_SITE_OTHER): Payer: Medicare HMO | Admitting: Cardiology

## 2019-09-19 VITALS — BP 131/86 | HR 90 | Ht 63.0 in | Wt 167.8 lb

## 2019-09-19 DIAGNOSIS — I10 Essential (primary) hypertension: Secondary | ICD-10-CM | POA: Diagnosis not present

## 2019-09-19 DIAGNOSIS — E7800 Pure hypercholesterolemia, unspecified: Secondary | ICD-10-CM

## 2019-09-19 DIAGNOSIS — R072 Precordial pain: Secondary | ICD-10-CM

## 2019-09-19 DIAGNOSIS — R079 Chest pain, unspecified: Secondary | ICD-10-CM | POA: Diagnosis not present

## 2019-09-19 DIAGNOSIS — R0602 Shortness of breath: Secondary | ICD-10-CM

## 2019-09-19 NOTE — Patient Instructions (Addendum)
Medication Instructions:  Your physician recommends that you continue on your current medications as directed. Please refer to the Current Medication list given to you today.  If you need a refill on your cardiac medications before your next appointment, please call your pharmacy.   Lab work: TODAY: BMET  If you have labs (blood work) drawn today and your tests are completely normal, you will receive your results only by: Marland Kitchen MyChart Message (if you have MyChart) OR . A paper copy in the mail If you have any lab test that is abnormal or we need to change your treatment, we will call you to review the results.  Testing/Procedures: Your physician has requested that you have cardiac CT. Cardiac computed tomography (CT) is a painless test that uses an x-ray machine to take clear, detailed pictures of your heart. For further information please visit [email or web address][email or web address]. Please follow instruction sheet as given.   Follow-Up: At [facility], you and your health needs are our priority.  As part of our continuing mission to provide you with exceptional heart care, we have created designated Provider Care Teams.  These Care Teams include your primary Cardiologist (physician) and Advanced Practice Providers (APPs -  Physician Assistants and Nurse Practitioners) who all work together to provide you with the care you need, when you need it. . You will need a follow up appointment in 1 year.  Please call our office 2 months in advance to schedule this appointment.  You may see Fransico Him, MD or one of the following Advanced Practice Providers on your designated Care Team:   . Lyda Jester, PA-C . Dayna Dunn, PA-C . Ermalinda Barrios, PA-C  Any Other Special Instructions Will Be Listed Below (If Applicable).  CARDIAC CT INSTRUCTIONS  Your cardiac CT will be scheduled at one of the below locations:   [facility] [address] [place], [place] [address] [phone]  OR  [facility] [address] [place], [place] [address] [phone]  If scheduled at [facility], please arrive at the [facility] main entrance of [facility] 30-45 minutes prior to test start time. Proceed to the [facility] Radiology Department (first floor) to check-in and test prep.  If scheduled at [facility], please arrive 15 mins early for check-in and test prep.  Please follow these instructions carefully (unless otherwise directed):   On the Night Before the Test: . Be sure to Drink plenty of water. . Do not consume any caffeinated/decaffeinated beverages or chocolate 12 hours prior to your test. . Do not take any antihistamines 12 hours prior to your test.   On the Day of the Test: . Drink plenty of water. Do not drink any water within one hour of the test. . Do not eat any food 4 hours prior to the test. . You may take your regular medications prior to the test.  . Take metoprolol (Lopressor) 100 MG two hours prior to test. . HOLD Lisinopril-Hydrochlorothiazide morning of the test. . FEMALES- please wear underwire-free bra if available     After the Test: . Drink plenty of water. . After receiving IV contrast, you may experience a mild flushed feeling. This is normal. . On occasion, you may experience a mild rash up to 24 hours after the test. This is not dangerous. If this occurs, you can take Benadryl 25 mg and increase your fluid intake. . If you experience trouble breathing, this can be  serious. If it is severe call 911 IMMEDIATELY. If it is mild, please call our office.   Please contact the cardiac imaging nurse navigator should you have any questions/concerns Marchia Bond, RN Navigator Cardiac Imaging [facility] Services [phone] Office

## 2019-09-19 NOTE — Progress Notes (Signed)
Cardiology Office Note:    Date:  09/19/2019   ID:  Debbie [name], DOB Dec 29, 1950, MRN [id number]  PCP:  Harlan Stains, MD  Cardiologist:  No primary care provider on file.    Referring MD: Caren Macadam, MD   Chief Complaint  Patient presents with  . Shortness of Breath  . Hypertension  . Hyperlipidemia    History of Present Illness:    Debbie [name] is a 68 y.o. female with a hx of GERD, esophageal stricture, hiatal hernia with Schatzki's ring, hyperlipidemia, hypertension and family history of heart disease who was seen by me a year ago for chest heaviness and shortness of breath with exertion.   An EKG done at her PCP office on 12/21/2017 showed normal sinus rhythm with diffuse T wave abnormality in the anterior precordial leads as well as lateral leads.  Nuclear stress test showed no ischemia.  2D echo showed mildly dilated LV.    She presents today for evaluation of DOE as well as lightheadedness.  She recently had some problems with dizziness and actually had a presyncopal/syncopal episode after standing up.  Her BP was low and Lininopril HCT cut back to 1 tablet daily and she has not had any further dizzy spells. She also has chronic DOE that she had last year at the time of a stress test and showed no ischemia.  Her SOB mainly occurs with going up stairs and has not changed any. She is complaining of some new chest tightness when she exerts herself or walks.  There is no radiation of the discomfort but does get SOB. She has a fm hx in both parents of CAD but later age in life.  She denies any PND, orthopnea, LE edema, or palpitations.    Past Medical History:  Diagnosis Date  . Acid reflux   . ALLERGIC RHINITIS   . Depression   . Diverticulosis   . Esophageal stricture   . Hiatal hernia    with  Schatzki's Ring  . Hyperlipidemia   . Hypertension   . IBS (irritable bowel syndrome)   . Kidney stones     Past Surgical History:  Procedure Laterality Date  .  ABDOMINAL HYSTERECTOMY    . ANKLE FRACTURE SURGERY  2012   right  . COLONOSCOPY  multiple  . ESOPHAGOGASTRODUODENOSCOPY  09/14/05   multiple  . KIDNEY STONE SURGERY    . TONSILLECTOMY    . TUBAL LIGATION    . VESICOVAGINAL FISTULA CLOSURE W/ TAH      Current Medications: Current Meds  Medication Sig  . amLODipine (NORVASC) 5 MG tablet Take 5 mg by mouth daily.  . Cholecalciferol (VITAMIN D-3) 1000 units CAPS Take 1,000 Units by mouth daily.  . Choline Fenofibrate (TRILIPIX) 45 MG capsule Take 45 mg by mouth every morning.   . CYMBALTA 60 MG capsule Take 120 mg by mouth every morning.   . dicyclomine (BENTYL) 20 MG tablet Take 40 mg by mouth 2 (two) times daily.  . fluticasone (FLONASE) 50 MCG/ACT nasal spray Place 2 sprays into both nostrils daily as needed for allergies or rhinitis.   Marland Kitchen KLOR-CON M20 20 MEQ tablet Take 40 mEq by mouth every morning.   Marland Kitchen levocetirizine (XYZAL) 5 MG tablet Take 5 mg by mouth every morning.  Marland Kitchen lisinopril-hydrochlorothiazide (PRINZIDE,ZESTORETIC) 20-12.5 MG tablet Take 1 tablet by mouth daily.  . Magnesium 500 MG TABS Take 500 mg by mouth at bedtime.  . metoprolol tartrate (LOPRESSOR) 50 MG tablet Take  1 tablet (50 mg total) by mouth 2 (two) times daily.  . Multiple Vitamin (MULTIVITAMIN) capsule Take 1 capsule by mouth every morning.   . pantoprazole (PROTONIX) 40 MG tablet Take 40 mg by mouth 2 (two) times daily.  Marland Kitchen saccharomyces boulardii (FLORASTOR) 250 MG capsule Take 1 capsule (250 mg total) by mouth 2 (two) times daily.     Allergies:   Patient has no known allergies.   Social History   Socioeconomic History  . Marital status: Married    Spouse name: Not on file  . Number of children: 3  . Years of education: Not on file  . Highest education level: Not on file  Occupational History  . 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 Occupation: Scientist, research (medical): [employer]  . Financial  resource strain: Not on file  . Food insecurity    Worry: Not on file    Inability: Not on file  . Transportation needs    Medical: Not on file    Non-medical: Not on file  Tobacco Use  . Smoking status: Never Smoker  . Smokeless tobacco: Never Used  Substance and Sexual Activity  . Alcohol use: No  . Drug use: No  . Sexual activity: Never  Lifestyle  . Physical activity    Days per week: Not on file    Minutes per session: Not on file  . Stress: Not on file  Relationships  . Social Herbalist on phone: Not on file    Gets together: Not on file    Attends religious service: Not on file    Active member of club or organization: Not on file    Attends meetings of clubs or organizations: Not on file    Relationship status: Not on file  Other Topics Concern  . Not on file  Social History Narrative  . Not on file     Family History: The patient's family history includes Allergies in her daughter, mother, and son; Diabetes in her paternal grandmother; Heart disease in her father and mother; Hyperlipidemia in her father and mother; Hypertension in her father and mother; Stroke in her father. There is no history of Colon cancer, Esophageal cancer, Rectal cancer, or Stomach cancer.  ROS:   Please see the history of present illness.    ROS  All other systems reviewed and negative.   EKGs/Labs/Other Studies Reviewed:    The following studies were reviewed today:   EKG:  EKG is  ordered today.  The ekg ordered today demonstrates NSR with nonspecific T wave abnormality  Recent Labs: No results found for requested labs within last 8760 hours.   Recent Lipid Panel No results found for: CHOL, TRIG, HDL, CHOLHDL, VLDL, LDLCALC, LDLDIRECT  Physical Exam:    VS:  BP 131/86   Pulse 90   Ht 5\' 3"  (1.6 m)   Wt 167 lb 12.8 oz (76.1 kg)   SpO2 93%   BMI 29.72 kg/m     Wt Readings from Last 3 Encounters:  09/19/19 167 lb 12.8 oz (76.1 kg)  03/13/18 171 lb (77.6 kg)   02/09/18 171 lb 3.2 oz (77.7 kg)     GEN:  Well nourished, well developed in no acute distress HEENT: Normal NECK: No JVD; No carotid bruits LYMPHATICS: No lymphadenopathy CARDIAC: RRR, no murmurs, rubs, gallops RESPIRATORY:  Clear to auscultation without rales, wheezing or rhonchi  ABDOMEN:  Soft, non-tender, non-distended MUSCULOSKELETAL:  No edema; No deformity  SKIN: Warm and dry NEUROLOGIC:  Alert and oriented x 3 PSYCHIATRIC:  Normal affect   ASSESSMENT:    1. SOB (shortness of breath)   2. Essential hypertension   3. Hypercholesteremia   4. Chest pain, unspecified type    PLAN:    In order of problems listed above:  1.  SOB -likely related to deconditioning an obesity -this has not changed since I saw her a year ago at which time nuclear stress test was normal. -2D echo a year ago was normal -see below - will get coronary CTA  2.  Dizziness/syncope -secondary to low BP likely overmedicated with BP meds -completely resolved with cutting back on ACE I/diuretic.  3.  HTN -BP controlled -continue amlodipine 5mg  daily, Lisinopril HCT 20-12.5mg  daily and lopressor 50mg  BID.  4.  Exertional chest pain -concerning for possible exertional angina -she has obesity, HTN, HLD and fm hx of CAD with nonspecific T wave abnormality on EKG -will get coronary CTA with FFR to assess for CAD   Medication Adjustments/Labs and Tests Ordered: Current medicines are reviewed at length with the patient today.  Concerns regarding medicines are outlined above.  Orders Placed This Encounter  Procedures  . EKG 12-Lead   No orders of the defined types were placed in this encounter.   Signed, Fransico Him, MD  09/19/2019 1:01 PM    [place]

## 2019-09-20 DIAGNOSIS — K581 Irritable bowel syndrome with constipation: Secondary | ICD-10-CM | POA: Diagnosis not present

## 2019-09-20 DIAGNOSIS — R1084 Generalized abdominal pain: Secondary | ICD-10-CM | POA: Diagnosis not present

## 2019-09-20 DIAGNOSIS — K5904 Chronic idiopathic constipation: Secondary | ICD-10-CM | POA: Diagnosis not present

## 2019-09-20 LAB — BASIC METABOLIC PANEL
BUN/Creatinine Ratio: 15 (ref 12–28)
BUN: 11 mg/dL (ref 8–27)
CO2: 26 mmol/L (ref 20–29)
Calcium: 9.6 mg/dL (ref 8.7–10.3)
Chloride: 98 mmol/L (ref 96–106)
Creatinine, Ser: 0.73 mg/dL (ref 0.57–1.00)
GFR calc Af Amer: 99 mL/min/{1.73_m2} (ref >59)
GFR calc non Af Amer: 86 mL/min/{1.73_m2} (ref >59)
Glucose: 88 mg/dL (ref 65–99)
Potassium: 3.9 mmol/L (ref 3.5–5.2)
Sodium: 142 mmol/L (ref 134–144)

## 2019-10-09 ENCOUNTER — Ambulatory Visit
Admission: RE | Admit: 2019-10-09 | Discharge: 2019-10-09 | Disposition: A | Discharge status: Home / Self Care | Payer: Medicare HMO | Source: Ambulatory Visit | Attending: Physician Assistant | Admitting: Physician Assistant

## 2019-10-09 ENCOUNTER — Other Ambulatory Visit: Payer: Self-pay | Admitting: Physician Assistant

## 2019-10-09 ENCOUNTER — Other Ambulatory Visit: Payer: Self-pay

## 2019-10-09 DIAGNOSIS — K5900 Constipation, unspecified: Secondary | ICD-10-CM | POA: Diagnosis not present

## 2019-10-09 DIAGNOSIS — K581 Irritable bowel syndrome with constipation: Secondary | ICD-10-CM

## 2019-10-09 DIAGNOSIS — R1084 Generalized abdominal pain: Secondary | ICD-10-CM | POA: Diagnosis not present

## 2019-10-18 DIAGNOSIS — I10 Essential (primary) hypertension: Secondary | ICD-10-CM | POA: Diagnosis not present

## 2019-10-18 DIAGNOSIS — F324 Major depressive disorder, single episode, in partial remission: Secondary | ICD-10-CM | POA: Diagnosis not present

## 2019-10-18 DIAGNOSIS — E785 Hyperlipidemia, unspecified: Secondary | ICD-10-CM | POA: Diagnosis not present

## 2019-10-21 ENCOUNTER — Telehealth (HOSPITAL_COMMUNITY): Payer: Self-pay | Admitting: Emergency Medicine

## 2019-10-21 NOTE — Telephone Encounter (Signed)
Left message on voicemail with name and callback number Nanetta Wiegman RN Navigator Cardiac Imaging [place] [facility] [phone] Office [phone] Cell  

## 2019-10-23 ENCOUNTER — Ambulatory Visit: Admission: RE | Admit: 2019-10-23 | Payer: Medicare HMO | Source: Ambulatory Visit

## 2019-10-29 ENCOUNTER — Telehealth (HOSPITAL_COMMUNITY): Payer: Self-pay | Admitting: Emergency Medicine

## 2019-10-29 NOTE — Telephone Encounter (Signed)
Left message on voicemail with name and callback number Pasha Broad RN Navigator Cardiac Imaging [place] [facility] [phone] Office [phone] Cell  

## 2019-10-30 ENCOUNTER — Ambulatory Visit: Admission: RE | Admit: 2019-10-30 | Payer: Medicare HMO | Source: Ambulatory Visit

## 2019-11-04 DIAGNOSIS — E6609 Other obesity due to excess calories: Secondary | ICD-10-CM | POA: Diagnosis not present

## 2019-11-06 ENCOUNTER — Telehealth (HOSPITAL_COMMUNITY): Payer: Self-pay | Admitting: Emergency Medicine

## 2019-11-06 ENCOUNTER — Ambulatory Visit: Payer: Medicare HMO

## 2019-11-06 NOTE — Telephone Encounter (Signed)
Reaching out to patient to offer assistance regarding upcoming cardiac imaging study; pt verbalizes understanding of appt date/time, parking situation and where to check in, pre-test NPO status and medications ordered, and verified current allergies; name and call back number provided for further questions should they arise Whitney Hillegass RN Navigator Cardiac Imaging [place] [facility] [phone] office [phone] cell 

## 2019-11-07 ENCOUNTER — Other Ambulatory Visit: Payer: Self-pay

## 2019-11-07 ENCOUNTER — Telehealth: Payer: Self-pay

## 2019-11-07 ENCOUNTER — Ambulatory Visit
Admission: RE | Admit: 2019-11-07 | Discharge: 2019-11-07 | Disposition: A | Discharge status: Home / Self Care | Payer: Medicare HMO | Source: Ambulatory Visit | Attending: Cardiology | Admitting: Cardiology

## 2019-11-07 DIAGNOSIS — R072 Precordial pain: Secondary | ICD-10-CM | POA: Insufficient documentation

## 2019-11-07 LAB — POCT I-STAT CREATININE: Creatinine, Ser: 0.6 mg/dL (ref 0.44–1.00)

## 2019-11-07 MED ORDER — METOPROLOL TARTRATE 5 MG/5ML IV SOLN
5.0000 mg | INTRAVENOUS | Status: DC | PRN
  Administered 2019-11-07: 10 mg via INTRAVENOUS

## 2019-11-07 MED ORDER — IOHEXOL 350 MG/ML SOLN
100.0000 mL | Freq: Once | INTRAVENOUS | Status: AC | PRN
  Administered 2019-11-07: 75 mL via INTRAVENOUS

## 2019-11-07 MED ORDER — NITROGLYCERIN 0.4 MG SL SUBL
0.8000 mg | SUBLINGUAL_TABLET | Freq: Once | SUBLINGUAL | Status: AC
  Administered 2019-11-07: 0.8 mg via SUBLINGUAL

## 2019-11-07 NOTE — Telephone Encounter (Signed)
-----   Message from [name], MD sent at 11/07/2019  1:28 PM EST ----- Please let patient know that labs were normal.  Continue current medical therapy.

## 2019-11-07 NOTE — Telephone Encounter (Signed)
Notes recorded by Frederik Schmidt, RN on 11/07/2019 at 1:35 PM EST  Lpm with results. 12/3  ------

## 2019-11-07 NOTE — Progress Notes (Signed)
Patient ID: Debbie [name], female   DOB: 10/27/1951, 68 y.o.   MRN: [id number]   Pt tolerated scan without incident; denies lightheadedness or dizziness upon changing positions; offered cocacola; pt ambulatory to lobby with steady gait

## 2019-11-08 ENCOUNTER — Telehealth: Payer: Self-pay | Admitting: *Deleted

## 2019-11-08 MED ORDER — ASPIRIN EC 81 MG PO TBEC: 81.0000 mg | DELAYED_RELEASE_TABLET | Freq: Every day | ORAL | 3 refills | Status: AC

## 2019-11-08 NOTE — Telephone Encounter (Signed)
Notes recorded by [name], MD on 11/08/2019 at 4:40 PM EST  Chest CT showed scarring In the lung with no masses or nodules. Large hiatal hernia present. 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 There was mild non obstructive calcified plaque in the mid LAD. Please start ASA 81mg  daily and get a copy of her last FLP and Hb1C. Please find out if she has had any further CP

## 2019-11-08 NOTE — Telephone Encounter (Signed)
-----   Message from [name], MD sent at 11/08/2019  4:41 PM EST ----- Since she lives in [place] please find out if she wants to continue to followup here for is she would like to see one of my partners in [place]

## 2019-11-08 NOTE — Telephone Encounter (Signed)
I spoke with pt and reviewed results/recommendations with her. She reports chest pressure and slight shortness of breath when lying down at night. Gets better after awhile. Short of breath when walking up stairs.  She reports PCP recently changed her from Simvastatin to Atorvastatin.

## 2019-11-11 NOTE — Telephone Encounter (Signed)
Please find out when she saw her GI MD last

## 2019-11-11 NOTE — Telephone Encounter (Signed)
Pt reports she had video visit with Dr Donna Christen at [facility] recently. Trulance was ordered and samples given but pt is unable to afford this.  Pt has virtual visit with Dr Donna Christen today and will make her aware recent CT showed large hiatal hernia

## 2019-11-11 NOTE — Telephone Encounter (Signed)
Please have her let us know what her GI MD says.  Followup with my extender in 3-4 weeks virtual

## 2019-11-11 NOTE — Telephone Encounter (Signed)
Scheduled patient for an appointment with Ermalinda Barrios, PA-C on 01/05. Patient will send a MyChart message after her appointment with GI to let us know what they say.

## 2019-11-11 NOTE — Telephone Encounter (Signed)
Pt would like to follow up in [facility]

## 2019-11-25 DIAGNOSIS — Z6830 Body mass index (BMI) 30.0-30.9, adult: Secondary | ICD-10-CM | POA: Diagnosis not present

## 2019-11-25 DIAGNOSIS — E669 Obesity, unspecified: Secondary | ICD-10-CM | POA: Diagnosis not present

## 2019-12-03 DIAGNOSIS — E785 Hyperlipidemia, unspecified: Secondary | ICD-10-CM | POA: Diagnosis not present

## 2019-12-03 DIAGNOSIS — I10 Essential (primary) hypertension: Secondary | ICD-10-CM | POA: Diagnosis not present

## 2019-12-03 DIAGNOSIS — F324 Major depressive disorder, single episode, in partial remission: Secondary | ICD-10-CM | POA: Diagnosis not present

## 2019-12-09 NOTE — Progress Notes (Deleted)
{Choose 1 Note Type (Telehealth Visit or Telephone Visit):[phone]}   Date:  12/09/2019   ID:  CAMAS KINSMAN, DOB Jul 07, 1951, MRN [id number]  {Patient [phone]::"Home"} {Provider Location:[phone]::"Home"}  PCP:  Harlan Stains, MD  Cardiologist:  No primary care provider on file. *** Electrophysiologist:  None   Evaluation Performed:  {Choose Visit Type:[phone]::"Follow-Up Visit"}  Chief Complaint:  ***  History of Present Illness:    Debbie [name] is a 69 y.o. female with with history of chest pain and abnormal EKG with diffuse T wave abnormality in the anterior precordial leads as well as lateral leads.  NST showed no ischemia 12/2017, 2D echo mildly dilated LV.  Also has hypertension, HLD, family history of heart disease, GERD, esophageal stricture, hiatal hernia with Schatzki's ring.  Patient saw Dr. Radford Pax 09/19/2019 complaining of dizziness with presyncope.  Blood pressure was low and lisinopril HCTZ was cut back and symptoms resolved..  Was also complaining of dyspnea on exertion and chest tightness when she exerted herself.  Coronary CTA 11/07/2019 calcium score 146 which was 84th percentile for age and sex match control.  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  Recommend preventative therapy and risk factor modification.  It was also noted she had a large hiatal hernia.  Stable densities in the lingula likely represent scarring.  The patient {does/does not:200015} have symptoms concerning for COVID-19 infection (fever, chills, cough, or new shortness of breath).    Past Medical History:  Diagnosis Date  . Acid reflux   . ALLERGIC RHINITIS   . Depression   . Diverticulosis   . Esophageal stricture   . Hiatal hernia    with  Schatzki's Ring  . Hyperlipidemia   . Hypertension   . IBS (irritable bowel syndrome)   . Kidney stones    Past Surgical History:   Procedure Laterality Date  . ABDOMINAL HYSTERECTOMY    . ANKLE FRACTURE SURGERY  2012   right  . COLONOSCOPY  multiple  . ESOPHAGOGASTRODUODENOSCOPY  09/14/05   multiple  . KIDNEY STONE SURGERY    . TONSILLECTOMY    . TUBAL LIGATION    . VESICOVAGINAL FISTULA CLOSURE W/ TAH       No outpatient medications have been marked as taking for the 12/10/19 encounter (Appointment) with Imogene Burn, PA-C.     Allergies:   Patient has no known allergies.   Social History   Tobacco Use  . Smoking status: Never Smoker  . Smokeless tobacco: Never Used  Substance Use Topics  . Alcohol use: No  . Drug use: No     Family Hx: The patient's family history includes Allergies in her daughter, mother, and son; Diabetes in her paternal grandmother; Heart disease in her father and mother; Hyperlipidemia in her father and mother; Hypertension in her father and mother; Stroke in her father. There is no history of Colon cancer, Esophageal cancer, Rectal cancer, or Stomach cancer.  ROS:   Please see the history of present illness.    *** All other systems reviewed and are negative.   Prior CV studies:   The following studies were reviewed today:  Coronary CTA 12/3/2020IMPRESSION: 1. Coronary calcium score of 146. This was 31 percentile for age and sex matched control.   2. 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 Right dominance.   3.  Mild non obstructive calcified plaque in the mid LAD   CAD-RADS 2. Mild non-obstructive CAD (25-49%). Consider non-atherosclerotic causes of chest pain. Consider preventive therapy and risk factor modification.   Electronically Signed: By: Kate Sable M.D. On: 11/07/2019 15:34     Labs/Other Tests and Data Reviewed:    EKG:  {EKG/Telemetry Strips [phone]}  Recent Labs: 09/19/2019: BUN 11; Potassium 3.9; Sodium  142 11/07/2019: Creatinine, Ser 0.60   Recent Lipid Panel No results found for: CHOL, TRIG, HDL, CHOLHDL, LDLCALC, LDLDIRECT  Wt Readings from Last 3 Encounters:  09/19/19 167 lb 12.8 oz (76.1 kg)  03/13/18 171 lb (77.6 kg)  02/09/18 171 lb 3.2 oz (77.7 kg)     Objective:    Vital Signs:  There were no vitals taken for this visit.   {HeartCare Virtual Exam (Optional):[phone]::"VITAL SIGNS:  reviewed"}  ASSESSMENT & PLAN:    1. Chest pain and shortness of breath with nonobstructive disease on coronary CTA.  Calcium score 146 and started on aspirin  2. Shortness of breath felt secondary to deconditioning 2D echo a year ago was normal as well as an NST. 3. Hypotension and dizziness resolved with lowering blood pressure medication  4. Hyperlipidemia on trilipix.-need most recent [place]   COVID-19 Education: The signs and symptoms of COVID-19 were discussed with the patient and how to seek care for testing (follow up with PCP or arrange E-visit).  ***The importance of social distancing was discussed today.  Time:   Today, I have spent *** minutes with the patient with telehealth technology discussing the above problems.     Medication Adjustments/Labs and Tests Ordered: Current medicines are reviewed at length with the patient today.  Concerns regarding medicines are outlined above.   Tests Ordered: No orders of the defined types were placed in this encounter.   Medication Changes: No orders of the defined types were placed in this encounter.   Follow Up:  {F/U Format:[phone]} {follow up:15908}  Signed, Debbie Barrios, PA-C  12/09/2019 3:40 PM    [facility]

## 2019-12-10 ENCOUNTER — Telehealth: Payer: Medicare HMO | Admitting: Physician Assistant

## 2019-12-10 ENCOUNTER — Other Ambulatory Visit: Payer: Self-pay

## 2019-12-16 DIAGNOSIS — Z1231 Encounter for screening mammogram for malignant neoplasm of breast: Secondary | ICD-10-CM | POA: Diagnosis not present

## 2019-12-16 DIAGNOSIS — N764 Abscess of vulva: Secondary | ICD-10-CM | POA: Diagnosis not present

## 2019-12-17 NOTE — Progress Notes (Deleted)
Cardiology Office Note    Date:  12/17/2019   ID:  GYPSIE LYBROOK, DOB [birth year], MRN [id number]  PCP:  Harlan Stains, MD  Cardiologist: No primary care provider on file. EPS: None  No chief complaint on file.   History of Present Illness:  Debbie [name] is a 69 y.o. female with a history of hypertension, hyperlipidemia, family history of heart disease, abnormal EKG in 2019 showing normal sinus rhythm with diffuse T wave abnormality anterior precordial leads and lateral leads.  Nuclear stress test no ischemia 2D echo mildly dilated LV.  Patient also has GERD, esophageal stricture, hiatal hernia with Schatzki's ring.  Patient saw Dr. Radford Pax 09/2019 with dizziness and presyncope after standing up.  Blood pressure was low and lisinopril HCTZ was cut back to 1 tablet daily.  She continued to have shortness of breath likely related to deconditioning but with her risk factors coronary CTA was ordered.  Performed 11/07/2019 calcium score 146 putting her in the 84th percentile for age and sex match control.  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  Aspirin was added  Past Medical History:  Diagnosis Date  . Acid reflux   . ALLERGIC RHINITIS   . Depression   . Diverticulosis   . Esophageal stricture   . Hiatal hernia    with  Schatzki's Ring  . Hyperlipidemia   . Hypertension   . IBS (irritable bowel syndrome)   . Kidney stones     Past Surgical History:  Procedure Laterality Date  . ABDOMINAL HYSTERECTOMY    . ANKLE FRACTURE SURGERY  2012   right  . COLONOSCOPY  multiple  . ESOPHAGOGASTRODUODENOSCOPY  09/14/05   multiple  . KIDNEY STONE SURGERY    . TONSILLECTOMY    . TUBAL LIGATION    . VESICOVAGINAL FISTULA CLOSURE W/ TAH      Current Medications: No outpatient medications have been marked as taking for the 12/18/19 encounter (Appointment) with Imogene Burn, PA-C.      Allergies:   Patient has no known allergies.   Social History   Socioeconomic History  . Marital status: Married    Spouse name: Not on file  . Number of children: 3  . Years of education: Not on file  . Highest education level: Not on file  Occupational History  . 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 Occupation: CUST Financial controller: [employer]  Tobacco Use  . Smoking status: Never Smoker  . Smokeless tobacco: Never Used  Substance and Sexual Activity  . Alcohol use: No  . Drug use: No  . Sexual activity: Never  Other Topics Concern  . Not on file  Social History Narrative  . Not on file   Social Determinants of Health   Financial Resource Strain:   . Difficulty of Paying Living Expenses: Not on file  Food Insecurity:   . Worried About Charity fundraiser in the Last Year: Not on file  . Ran Out of Food in the Last Year: Not on file  Transportation Needs:   . Lack of Transportation (Medical): Not on file  . Lack of Transportation (Non-Medical): Not on file  Physical Activity:   . Days of Exercise per Week: Not on file  . Minutes of Exercise per Session: Not on file  Stress:   . Feeling of Stress :  Not on file  Social Connections:   . Frequency of Communication with Friends and Family: Not on file  . Frequency of Social Gatherings with Friends and Family: Not on file  . Attends Religious Services: Not on file  . Active Member of Clubs or Organizations: Not on file  . Attends Archivist Meetings: Not on file  . Marital Status: Not on file     Family History:  The patient's ***family history includes Allergies in her daughter, mother, and son; Diabetes in her paternal grandmother; Heart disease in her father and mother; Hyperlipidemia in her father and mother; Hypertension in her father and mother; Stroke in her father.   ROS:   Please see the history of present illness.    ROS All other systems reviewed and  are negative.   PHYSICAL EXAM:   VS:  There were no vitals taken for this visit.  Physical Exam  GEN: Well nourished, well developed, in no acute distress  HEENT: normal  Neck: no JVD, carotid bruits, or masses Cardiac:RRR; no murmurs, rubs, or gallops  Respiratory:  clear to auscultation bilaterally, normal work of breathing GI: soft, nontender, nondistended, + BS Ext: without cyanosis, clubbing, or edema, Good distal pulses bilaterally MS: no deformity or atrophy  Skin: warm and dry, no rash Neuro:  Alert and Oriented x 3, Strength and sensation are intact Psych: euthymic mood, full affect  Wt Readings from Last 3 Encounters:  09/19/19 167 lb 12.8 oz (76.1 kg)  03/13/18 171 lb (77.6 kg)  02/09/18 171 lb 3.2 oz (77.7 kg)      Studies/Labs Reviewed:   EKG:  EKG is*** ordered today.  The ekg ordered today demonstrates ***  Recent Labs: 09/19/2019: BUN 11; Potassium 3.9; Sodium 142 11/07/2019: Creatinine, Ser 0.60   Lipid Panel No results found for: CHOL, TRIG, HDL, CHOLHDL, VLDL, LDLCALC, LDLDIRECT  Additional studies/ records that were reviewed today include:  Coronary CTA 12/3/2020IMPRESSION: 1. Coronary calcium score of 146. This was 35 percentile for age and sex matched control.   2. 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 Right dominance.   3.  Mild non obstructive calcified plaque in the mid LAD   CAD-RADS 2. Mild non-obstructive CAD (25-49%). Consider non-atherosclerotic causes of chest pain. Consider preventive therapy and risk factor modification.   Electronically Signed: By: Kate Sable M.D. On: 11/07/2019 15:34   2D echo 3/19/2019Study Conclusions   - Left ventricle: The cavity size was mildly dilated. Systolic   function was normal. The estimated ejection fraction was in the   range of 55% to 60%. Wall motion was normal; there  were no   regional wall motion abnormalities. Left ventricular diastolic   function parameters were normal. - Aortic valve: There was no regurgitation. - Right ventricle: The cavity size was normal. Wall thickness was   normal. Systolic function was normal. - Tricuspid valve: There was mild regurgitation. - Pulmonary arteries: Systolic pressure was mildly increased. PA   peak pressure: 31 mm Hg (S). - Inferior vena cava: The vessel was normal in size. - Pericardium, extracardiac: A trivial pericardial effusion was   identified posterior to the heart. Features were not consistent   with tamponade physiology.   /10/19Study Highlights   Nuclear stress EF: 60%.  Blood pressure demonstrated a normal response to exercise.  There was no ST segment deviation noted during stress.  Defect 1: There is  a small defect of mild severity present in the mid anteroseptal, apical anterior and apical septal location. Findings consistent with breast attenuation artifact.  The study is normal.  This is a low risk study.  The left ventricular ejection fraction is normal (55-65%).         ASSESSMENT:    No diagnosis found.   PLAN:  In order of problems listed above:  Chest pain and shortness of breath coronary CTA 11/2019 calcium score 14 6 that puts her in the 84th percentile for age and sex match results with mild nonobstructive calcified plaque in the mid LAD.  Aspirin 81 mg was started and she is on atorvastatin.  CT also showed a large hiatal hernia and she was referred to GI.  Essential hypertension  Hyperlipidemia  Obesity  Medication Adjustments/Labs and Tests Ordered: Current medicines are reviewed at length with the patient today.  Concerns regarding medicines are outlined above.  Medication changes, Labs and Tests ordered today are listed in the Patient Instructions below. There are no Patient Instructions on file for this visit.   Sumner Boast, PA-C  12/17/2019  9:58 AM    [facility] [place], [place], [place]  [address] Phone: [phone]; Fax: [phone]

## 2019-12-18 ENCOUNTER — Other Ambulatory Visit: Payer: Self-pay

## 2019-12-18 ENCOUNTER — Encounter: Payer: Self-pay | Admitting: Physician Assistant

## 2019-12-18 ENCOUNTER — Telehealth (INDEPENDENT_AMBULATORY_CARE_PROVIDER_SITE_OTHER): Payer: Medicare HMO | Admitting: Physician Assistant

## 2019-12-18 VITALS — BP 138/82 | Ht 63.0 in | Wt 167.0 lb

## 2019-12-18 DIAGNOSIS — K449 Diaphragmatic hernia without obstruction or gangrene: Secondary | ICD-10-CM

## 2019-12-18 DIAGNOSIS — R072 Precordial pain: Secondary | ICD-10-CM | POA: Diagnosis not present

## 2019-12-18 DIAGNOSIS — R0600 Dyspnea, unspecified: Secondary | ICD-10-CM | POA: Diagnosis not present

## 2019-12-18 DIAGNOSIS — E785 Hyperlipidemia, unspecified: Secondary | ICD-10-CM

## 2019-12-18 DIAGNOSIS — R0609 Other forms of dyspnea: Secondary | ICD-10-CM

## 2019-12-18 NOTE — Progress Notes (Signed)
Cardiology Office Note        Virtual Visit via Telephone Note   This visit type was conducted due to national recommendations for restrictions regarding the COVID-19 Pandemic (e.g. social distancing) in an effort to limit this patient's exposure and mitigate transmission in our community.  Due to her co-morbid illnesses, this patient is at least at moderate risk for complications without adequate follow up.  This format is felt to be most appropriate for this patient at this time.  The patient did not have access to video technology/had technical difficulties with video requiring transitioning to audio format only (telephone).  All issues noted in this document were discussed and addressed.  No physical exam could be performed with this format.  Please refer to the patient's chart for her  consent to telehealth for [facility].   Date:  12/18/2019   ID:  Debbie [name], DOB 03-13-1951, MRN [id number]  Patient Location: Home Provider Location: Home  PCP:  Harlan Stains, MD  Cardiologist:  Fransico Him, MD   Electrophysiologist:  None   Evaluation Performed:  Follow-Up Visit  Chief Complaint:  Follow up  History of Present Illness:    Debbie [name] is a 69 y.o. female  with a history of hypertension, hyperlipidemia, family history of heart disease, abnormal EKG in 2019 showing normal sinus rhythm with diffuse T wave abnormality anterior precordial leads and lateral leads.  Nuclear stress test no ischemia 2D echo mildly dilated LV.  Patient also has GERD, esophageal stricture, hiatal hernia with Schatzki's ring.  Patient saw Dr. Radford Pax 09/2019 with dizziness and presyncope after standing up.  Blood pressure was low and lisinopril HCTZ was cut back to 1 tablet daily.  She continued to have shortness of breath likely related to deconditioning but with her risk factors coronary CTA was ordered.  Performed 11/07/2019 calcium score 146 putting her in the 84th percentile for age and sex  match control.  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  Aspirin was added.  Patient denies any further dizziness. BP running 130-138/80's.Watches salt. No further chest pain. Chronic dyspnea on exertion unchanged. Walking 20 min daily.    The patient does not have symptoms concerning for COVID-19 infection (fever, chills, cough, or new shortness of breath).    Past Medical History:  Diagnosis Date  . Acid reflux   . ALLERGIC RHINITIS   . Depression   . Diverticulosis   . Esophageal stricture   . Hiatal hernia    with  Schatzki's Ring  . Hyperlipidemia   . Hypertension   . IBS (irritable bowel syndrome)   . Kidney stones    Past Surgical History:  Procedure Laterality Date  . ABDOMINAL HYSTERECTOMY    . ANKLE FRACTURE SURGERY  2012   right  . COLONOSCOPY  multiple  . ESOPHAGOGASTRODUODENOSCOPY  09/14/05   multiple  . KIDNEY STONE SURGERY    . TONSILLECTOMY    . TUBAL LIGATION    . VESICOVAGINAL FISTULA CLOSURE W/ TAH       Current Meds  Medication Sig  . amLODipine (NORVASC) 5 MG tablet Take 5 mg by mouth daily.  Marland Kitchen aspirin EC 81 MG tablet Take 1 tablet (81 mg total) by mouth daily.  . Cholecalciferol (VITAMIN D-3) 1000 units CAPS Take 1,000 Units by mouth daily.  . Choline Fenofibrate (TRILIPIX) 45 MG capsule Take 45 mg by mouth every morning.   Marland Kitchen  CYMBALTA 60 MG capsule Take 120 mg by mouth every morning.   . dicyclomine (BENTYL) 20 MG tablet Take 40 mg by mouth 2 (two) times daily.  . fluticasone (FLONASE) 50 MCG/ACT nasal spray Place 2 sprays into both nostrils daily as needed for allergies or rhinitis.   Marland Kitchen KLOR-CON M20 20 MEQ tablet Take 40 mEq by mouth every morning.   Marland Kitchen levocetirizine (XYZAL) 5 MG tablet Take 5 mg by mouth every morning.  Marland Kitchen lisinopril-hydrochlorothiazide (PRINZIDE,ZESTORETIC) 20-12.5 MG tablet Take 1 tablet by mouth daily.  . Magnesium 500 MG TABS  Take 500 mg by mouth at bedtime.  . metoprolol tartrate (LOPRESSOR) 50 MG tablet Take 1 tablet (50 mg total) by mouth 2 (two) times daily.  . Multiple Vitamin (MULTIVITAMIN) capsule Take 1 capsule by mouth every morning.   . pantoprazole (PROTONIX) 40 MG tablet Take 40 mg by mouth 2 (two) times daily.  Marland Kitchen saccharomyces boulardii (FLORASTOR) 250 MG capsule Take 1 capsule (250 mg total) by mouth 2 (two) times daily.     Allergies:   Patient has no known allergies.   Social History   Tobacco Use  . Smoking status: Never Smoker  . Smokeless tobacco: Never Used  Substance Use Topics  . Alcohol use: No  . Drug use: No     Family Hx: The patient's family history includes Allergies in her daughter, mother, and son; Diabetes in her paternal grandmother; Heart disease in her father and mother; Hyperlipidemia in her father and mother; Hypertension in her father and mother; Stroke in her father. There is no history of Colon cancer, Esophageal cancer, Rectal cancer, or Stomach cancer.  ROS:   Please see the history of present illness.      All other systems reviewed and are negative.   Prior CV studies:   The following studies were reviewed today: Coronary CTA 12/3/2020IMPRESSION: 1. Coronary calcium score of 146. This was 41 percentile for age and sex matched control.   2. 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 Right dominance.   3.  Mild non obstructive calcified plaque in the mid LAD   CAD-RADS 2. Mild non-obstructive CAD (25-49%). Consider non-atherosclerotic causes of chest pain. Consider preventive therapy and risk factor modification.   Electronically Signed: By: Kate Sable M.D. On: 11/07/2019 15:34   2D echo 3/19/2019Study Conclusions   - Left ventricle: The cavity size was mildly dilated. Systolic   function was normal. The estimated ejection  fraction was in the   range of 55% to 60%. Wall motion was normal; there were no   regional wall motion abnormalities. Left ventricular diastolic   function parameters were normal. - Aortic valve: There was no regurgitation. - Right ventricle: The cavity size was normal. Wall thickness was   normal. Systolic function was normal. - Tricuspid valve: There was mild regurgitation. - Pulmonary arteries: Systolic pressure was mildly increased. PA   peak pressure: 31 mm Hg (S). - Inferior vena cava: The vessel was normal in size. - Pericardium, extracardiac: A trivial pericardial effusion was   identified posterior to the heart. Features were not consistent   with tamponade physiology.   /10/19Study Highlights   Nuclear stress EF: 60%.  Blood pressure demonstrated a normal response to exercise.  There was no ST segment deviation noted during stress.  Defect 1: There is a small defect of mild severity present in the mid anteroseptal, apical anterior  and apical septal location. Findings consistent with breast attenuation artifact.  The study is normal.  This is a low risk study.  The left ventricular ejection fraction is normal (55-65%).           Labs/Other Tests and Data Reviewed:    EKG:  No ECG reviewed.  Recent Labs: 09/19/2019: BUN 11; Potassium 3.9; Sodium 142 11/07/2019: Creatinine, Ser 0.60   Recent Lipid Panel No results found for: CHOL, TRIG, HDL, CHOLHDL, LDLCALC, LDLDIRECT  Wt Readings from Last 3 Encounters:  12/18/19 167 lb (75.8 kg)  09/19/19 167 lb 12.8 oz (76.1 kg)  03/13/18 171 lb (77.6 kg)     Objective:    Vital Signs:  BP 138/82   Ht 5\' 3"  (1.6 m)   Wt 167 lb (75.8 kg)   BMI 29.58 kg/m    VITAL SIGNS:  reviewed  ASSESSMENT & PLAN:    Chest pain and shortness of breath coronary CTA 11/2019 calcium score 14 6 that puts her in the 84th percentile for age and sex match results with mild nonobstructive calcified plaque in the mid LAD.   Aspirin 81 mg was started and she is on atorvastatin. Results of CT discussed with patient in detail. 150 min exercise weekly.  Essential hypertension BP doing better with adjustment. No further dizziness.  Hyperlipidemia LDL 74 09/2019 atorvastatin 40 mg started about 3 months ago but missed a month because of insurance.She is scheduled for blood work with PCP later this month and will have it checked then.  Large hiatal hernia on CT-she has appt with GI on Monday.   COVID-19 Education: The signs and symptoms of COVID-19 were discussed with the patient and how to seek care for testing (follow up with PCP or arrange E-visit).   The importance of social distancing was discussed today.  Time:   Today, I have spent 22 minutes with the patient with telehealth technology discussing the above problems.     Medication Adjustments/Labs and Tests Ordered: Current medicines are reviewed at length with the patient today.  Concerns regarding medicines are outlined above.   Tests Ordered: No orders of the defined types were placed in this encounter.   Medication Changes: No orders of the defined types were placed in this encounter.   Follow Up:  Either In Person or Virtual in 6 month(s) Dr. Radford Pax  Signed, Ermalinda Barrios, PA-C  12/18/2019 9:25 AM    [place]

## 2019-12-18 NOTE — Patient Instructions (Signed)
Your physician recommends that you continue on your current medications as directed. Please refer to the Current Medication list given to you today. INCREASE WALKING AND WEIGHT LOSS AND RECHECK FASTING LABS    Your physician wants you to follow-up in: 6 MONTHS WITH DR Radford Pax  You will receive a reminder letter in the mail two months in advance. If you don't receive a letter, please call our office to schedule the follow-up appointment.

## 2019-12-19 ENCOUNTER — Other Ambulatory Visit: Payer: Self-pay

## 2019-12-19 DIAGNOSIS — Z1231 Encounter for screening mammogram for malignant neoplasm of breast: Secondary | ICD-10-CM

## 2019-12-23 DIAGNOSIS — K581 Irritable bowel syndrome with constipation: Secondary | ICD-10-CM | POA: Diagnosis not present

## 2020-01-01 DIAGNOSIS — E669 Obesity, unspecified: Secondary | ICD-10-CM | POA: Diagnosis not present

## 2020-01-03 DIAGNOSIS — F324 Major depressive disorder, single episode, in partial remission: Secondary | ICD-10-CM | POA: Diagnosis not present

## 2020-01-03 DIAGNOSIS — I10 Essential (primary) hypertension: Secondary | ICD-10-CM | POA: Diagnosis not present

## 2020-01-03 DIAGNOSIS — E785 Hyperlipidemia, unspecified: Secondary | ICD-10-CM | POA: Diagnosis not present

## 2020-01-28 ENCOUNTER — Ambulatory Visit: Payer: Medicare HMO

## 2020-01-31 DIAGNOSIS — E782 Mixed hyperlipidemia: Secondary | ICD-10-CM | POA: Diagnosis not present

## 2020-02-10 DIAGNOSIS — K5792 Diverticulitis of intestine, part unspecified, without perforation or abscess without bleeding: Secondary | ICD-10-CM | POA: Diagnosis not present

## 2020-02-10 DIAGNOSIS — F4321 Adjustment disorder with depressed mood: Secondary | ICD-10-CM | POA: Diagnosis not present

## 2020-02-10 DIAGNOSIS — F419 Anxiety disorder, unspecified: Secondary | ICD-10-CM | POA: Diagnosis not present

## 2020-02-15 ENCOUNTER — Ambulatory Visit: Payer: Medicare HMO

## 2020-02-17 DIAGNOSIS — E785 Hyperlipidemia, unspecified: Secondary | ICD-10-CM | POA: Diagnosis not present

## 2020-02-17 DIAGNOSIS — F324 Major depressive disorder, single episode, in partial remission: Secondary | ICD-10-CM | POA: Diagnosis not present

## 2020-02-17 DIAGNOSIS — E782 Mixed hyperlipidemia: Secondary | ICD-10-CM | POA: Diagnosis not present

## 2020-02-17 DIAGNOSIS — F4321 Adjustment disorder with depressed mood: Secondary | ICD-10-CM | POA: Diagnosis not present

## 2020-02-17 DIAGNOSIS — I10 Essential (primary) hypertension: Secondary | ICD-10-CM | POA: Diagnosis not present

## 2020-02-25 ENCOUNTER — Encounter (HOSPITAL_COMMUNITY): Payer: Self-pay | Admitting: Emergency Medicine

## 2020-02-25 ENCOUNTER — Ambulatory Visit (INDEPENDENT_AMBULATORY_CARE_PROVIDER_SITE_OTHER): Payer: Medicare HMO

## 2020-02-25 ENCOUNTER — Other Ambulatory Visit: Payer: Self-pay

## 2020-02-25 ENCOUNTER — Ambulatory Visit (HOSPITAL_COMMUNITY)
Admission: EM | Admit: 2020-02-25 | Discharge: 2020-02-25 | Disposition: A | Discharge status: Home / Self Care | Payer: Medicare HMO | Attending: Urgent Care | Admitting: Urgent Care

## 2020-02-25 DIAGNOSIS — M25431 Effusion, right wrist: Secondary | ICD-10-CM

## 2020-02-25 DIAGNOSIS — M25531 Pain in right wrist: Secondary | ICD-10-CM | POA: Diagnosis not present

## 2020-02-25 DIAGNOSIS — S66911A Strain of unspecified muscle, fascia and tendon at wrist and hand level, right hand, initial encounter: Secondary | ICD-10-CM

## 2020-02-25 DIAGNOSIS — I10 Essential (primary) hypertension: Secondary | ICD-10-CM

## 2020-02-25 MED ORDER — IBUPROFEN 600 MG PO TABS: 600.0000 mg | ORAL_TABLET | Freq: Three times a day (TID) | ORAL | 0 refills | Status: DC | PRN

## 2020-02-25 NOTE — ED Triage Notes (Signed)
Noticed bruising to wrist a few days ago.  Yesterday noticed significant pain and limited movement.  Patient has a right radial pulse of 2+.  Patient is able to move fingers.  Patient denies a wrist injury.  Patient has a bandaid to tip of right thumb said to be a cut while cutting food items

## 2020-02-25 NOTE — ED Provider Notes (Signed)
[place]   MRN: [id number] DOB: February 24, 1951  Subjective:   Debbie [name] is a 69 y.o. female presenting for 2 to 3-day history persistent and worsening right wrist pain with swelling that is radiating upwards along .  Symptoms became significantly worse yesterday, has had difficulty moving her wrist around and noticed some bruising.  She is able to use her fingers.  Denies history of arthritis.  Denies fall, trauma.  Patient is very active with her grandchildren, plays with them and carries them at times.  She was also cooking this past weekend.  Has used ibuprofen with some relief.  No current facility-administered medications for this encounter.  Current Outpatient Medications:  .  amLODipine (NORVASC) 5 MG tablet, Take 5 mg by mouth daily., Disp: , Rfl:  .  aspirin EC 81 MG tablet, Take 1 tablet (81 mg total) by mouth daily., Disp: 90 tablet, Rfl: 3 .  Cholecalciferol (VITAMIN D-3) 1000 units CAPS, Take 1,000 Units by mouth daily., Disp: , Rfl:  .  Choline Fenofibrate (TRILIPIX) 45 MG capsule, Take 45 mg by mouth every morning. , Disp: , Rfl:  .  CYMBALTA 60 MG capsule, Take 120 mg by mouth every morning. , Disp: , Rfl:  .  dicyclomine (BENTYL) 20 MG tablet, Take 40 mg by mouth 2 (two) times daily., Disp: , Rfl: 2 .  fluticasone (FLONASE) 50 MCG/ACT nasal spray, Place 2 sprays into both nostrils daily as needed for allergies or rhinitis. , Disp: , Rfl:  .  KLOR-CON M20 20 MEQ tablet, Take 40 mEq by mouth every morning. , Disp: , Rfl:  .  levocetirizine (XYZAL) 5 MG tablet, Take 5 mg by mouth every morning., Disp: , Rfl:  .  lisinopril-hydrochlorothiazide (PRINZIDE,ZESTORETIC) 20-12.5 MG tablet, Take 1 tablet by mouth daily., Disp: , Rfl: 0 .  Magnesium 500 MG TABS, Take 500 mg by mouth at bedtime., Disp: , Rfl:  .  metoprolol tartrate (LOPRESSOR) 50 MG tablet, Take 1 tablet (50 mg total) by mouth 2 (two) times daily., Disp: 1 tablet, Rfl: 0 .  Multiple Vitamin (MULTIVITAMIN)  capsule, Take 1 capsule by mouth every morning. , Disp: , Rfl:  .  pantoprazole (PROTONIX) 40 MG tablet, Take 40 mg by mouth 2 (two) times daily., Disp: , Rfl: 3 .  saccharomyces boulardii (FLORASTOR) 250 MG capsule, Take 1 capsule (250 mg total) by mouth 2 (two) times daily., Disp: , Rfl:    No Known Allergies  Past Medical History:  Diagnosis Date  . Acid reflux   . ALLERGIC RHINITIS   . Depression   . Diverticulosis   . Esophageal stricture   . Hiatal hernia    with  Schatzki's Ring  . Hyperlipidemia   . Hypertension   . IBS (irritable bowel syndrome)   . Kidney stones      Past Surgical History:  Procedure Laterality Date  . ABDOMINAL HYSTERECTOMY    . ANKLE FRACTURE SURGERY  2012   right  . COLONOSCOPY  multiple  . ESOPHAGOGASTRODUODENOSCOPY  09/14/05   multiple  . KIDNEY STONE SURGERY    . TONSILLECTOMY    . TUBAL LIGATION    . VESICOVAGINAL FISTULA CLOSURE W/ TAH      Family History  Problem Relation Age of Onset  . Allergies Mother   . Heart disease Mother        bypass surgery  . Hypertension Mother   . Hyperlipidemia Mother   . Heart disease Father  bypass surgery  . Hypertension Father   . Hyperlipidemia Father   . Stroke Father   . Allergies Daughter   . Allergies Son   . Diabetes Paternal Grandmother   . Colon cancer Neg Hx   . Esophageal cancer Neg Hx   . Rectal cancer Neg Hx   . Stomach cancer Neg Hx     Social History   Tobacco Use  . Smoking status: Never Smoker  . Smokeless tobacco: Never Used  Substance Use Topics  . Alcohol use: No  . Drug use: No    ROS   Objective:   Vitals: BP 118/74 (BP Location: Left Arm)   Pulse 94   Temp 98 F (36.7 C) (Oral)   Resp 16   SpO2 95%   Physical Exam Constitutional:      General: She is not in acute distress.    Appearance: Normal appearance. She is well-developed. She is not ill-appearing, toxic-appearing or diaphoretic.  HENT:     Head: Normocephalic and atraumatic.      Nose: Nose normal.     Mouth/Throat:     Mouth: Mucous membranes are moist.     Pharynx: Oropharynx is clear.  Eyes:     General: No scleral icterus.    Extraocular Movements: Extraocular movements intact.     Pupils: Pupils are equal, round, and reactive to light.  Cardiovascular:     Rate and Rhythm: Normal rate.  Pulmonary:     Effort: Pulmonary effort is normal.  Musculoskeletal:     Right forearm: No swelling, edema, deformity, lacerations, tenderness or bony tenderness.     Right wrist: Swelling (1+), tenderness (throughout entirety of wrist, worse over dorsal aspect) and bony tenderness present. No deformity, effusion, lacerations, snuff box tenderness or crepitus. Decreased range of motion. Normal pulse.     Right hand: No swelling, deformity, lacerations, tenderness or bony tenderness. Normal range of motion. Normal strength. Normal sensation. Normal capillary refill.  Skin:    General: Skin is warm and dry.  Neurological:     General: No focal deficit present.     Mental Status: She is alert and oriented to person, place, and time.  Psychiatric:        Mood and Affect: Mood normal.        Behavior: Behavior normal.        Thought Content: Thought content normal.        Judgment: Judgment normal.    DG Wrist Complete Right  Result Date: 02/25/2020 CLINICAL DATA:  Acute right wrist pain and swelling without known injury. EXAM: RIGHT WRIST - COMPLETE 3+ VIEW COMPARISON:  June 30, 2016. FINDINGS: There is no evidence of fracture or dislocation. There is no evidence of arthropathy or other focal bone abnormality. Soft tissues are unremarkable. IMPRESSION: Negative. Electronically Signed   By: Marijo Conception M.D.   On: 02/25/2020 09:18    Assessment and Plan :   1. Wrist strain, right, initial encounter   2. Right wrist pain   3. Wrist swelling, right   4. Essential hypertension     We will use conservative management for wrist strain.  Counseled patient on risks of  using NSAID but she prefers to take this route.  Recommended Ace wrap, provided to the patient in clinic and wrapped her wrist.  Monitor blood pressure at home. Counseled patient on potential for adverse effects with medications prescribed/recommended today, ER and return-to-clinic precautions discussed, patient verbalized understanding.    Jaynee Eagles,  PA-C 02/25/20 1158

## 2020-02-26 ENCOUNTER — Ambulatory Visit: Payer: Medicare HMO

## 2020-03-02 ENCOUNTER — Ambulatory Visit
Admission: RE | Admit: 2020-03-02 | Discharge: 2020-03-02 | Disposition: A | Discharge status: Home / Self Care | Payer: Medicare HMO | Source: Ambulatory Visit | Attending: Physician Assistant | Admitting: Physician Assistant

## 2020-03-02 ENCOUNTER — Other Ambulatory Visit: Payer: Self-pay

## 2020-03-02 DIAGNOSIS — Z1231 Encounter for screening mammogram for malignant neoplasm of breast: Secondary | ICD-10-CM

## 2020-03-03 DIAGNOSIS — E6609 Other obesity due to excess calories: Secondary | ICD-10-CM | POA: Diagnosis not present

## 2020-03-04 ENCOUNTER — Other Ambulatory Visit: Payer: Self-pay | Admitting: Physician Assistant

## 2020-03-04 DIAGNOSIS — R928 Other abnormal and inconclusive findings on diagnostic imaging of breast: Secondary | ICD-10-CM

## 2020-03-06 ENCOUNTER — Ambulatory Visit: Payer: Medicare HMO

## 2020-03-16 ENCOUNTER — Other Ambulatory Visit: Payer: Self-pay

## 2020-03-16 ENCOUNTER — Ambulatory Visit
Admission: RE | Admit: 2020-03-16 | Discharge: 2020-03-16 | Disposition: A | Discharge status: Home / Self Care | Payer: Medicare HMO | Source: Ambulatory Visit | Attending: Physician Assistant | Admitting: Physician Assistant

## 2020-03-16 DIAGNOSIS — R928 Other abnormal and inconclusive findings on diagnostic imaging of breast: Secondary | ICD-10-CM

## 2020-03-16 DIAGNOSIS — N6321 Unspecified lump in the left breast, upper outer quadrant: Secondary | ICD-10-CM | POA: Diagnosis not present

## 2020-03-19 ENCOUNTER — Ambulatory Visit: Payer: Medicare HMO

## 2020-03-20 ENCOUNTER — Ambulatory Visit: Payer: Medicare HMO | Attending: Internal Medicine

## 2020-03-20 DIAGNOSIS — Z23 Encounter for immunization: Secondary | ICD-10-CM

## 2020-03-20 NOTE — Progress Notes (Signed)
   Covid-19 Vaccination Clinic  Name:  Debbie [name]    MRN: [id number] DOB: [birth year]  03/20/2020  [name] was observed post Covid-19 immunization for 15 minutes without incident. She was provided with Vaccine Information Sheet and instruction to access the V-Safe system.   [name] was instructed to call 911 with any severe reactions post vaccine: Marland Kitchen Difficulty breathing  . Swelling of face and throat  . A fast heartbeat  . A bad rash all over body  . Dizziness and weakness   Immunizations Administered    Name Date Dose VIS Date Route   Pfizer COVID-19 Vaccine 03/20/2020 12:20 PM 0.3 mL 11/15/2019 Intramuscular   Manufacturer: [place]   Lot: [id number]   [employer]: [id number]

## 2020-04-14 ENCOUNTER — Ambulatory Visit: Payer: Medicare HMO | Attending: Internal Medicine

## 2020-04-14 DIAGNOSIS — Z23 Encounter for immunization: Secondary | ICD-10-CM

## 2020-04-14 NOTE — Progress Notes (Signed)
   Covid-19 Vaccination Clinic  Name:  Debbie [name]    MRN: [id number] DOB: 10/27/1951  04/14/2020  Debbie [name] was observed post Covid-19 immunization for 15 minutes without incident. She was provided with Vaccine Information Sheet and instruction to access the V-Safe system.   Debbie [name] was instructed to call 911 with any severe reactions post vaccine: Marland Kitchen Difficulty breathing  . Swelling of face and throat  . A fast heartbeat  . A bad rash all over body  . Dizziness and weakness   Immunizations Administered    Name Date Dose VIS Date Route   Pfizer COVID-19 Vaccine 04/14/2020 12:50 PM 0.3 mL 01/29/2019 Intramuscular   Manufacturer: [place]   Lot: [id number]   [employer]: [id number]

## 2020-04-27 DIAGNOSIS — I10 Essential (primary) hypertension: Secondary | ICD-10-CM | POA: Diagnosis not present

## 2020-04-27 DIAGNOSIS — F4321 Adjustment disorder with depressed mood: Secondary | ICD-10-CM | POA: Diagnosis not present

## 2020-04-27 DIAGNOSIS — E782 Mixed hyperlipidemia: Secondary | ICD-10-CM | POA: Diagnosis not present

## 2020-04-27 DIAGNOSIS — E785 Hyperlipidemia, unspecified: Secondary | ICD-10-CM | POA: Diagnosis not present

## 2020-04-27 DIAGNOSIS — F324 Major depressive disorder, single episode, in partial remission: Secondary | ICD-10-CM | POA: Diagnosis not present

## 2020-05-25 DIAGNOSIS — E785 Hyperlipidemia, unspecified: Secondary | ICD-10-CM | POA: Diagnosis not present

## 2020-05-25 DIAGNOSIS — F4321 Adjustment disorder with depressed mood: Secondary | ICD-10-CM | POA: Diagnosis not present

## 2020-05-25 DIAGNOSIS — E782 Mixed hyperlipidemia: Secondary | ICD-10-CM | POA: Diagnosis not present

## 2020-05-25 DIAGNOSIS — I10 Essential (primary) hypertension: Secondary | ICD-10-CM | POA: Diagnosis not present

## 2020-05-25 DIAGNOSIS — F324 Major depressive disorder, single episode, in partial remission: Secondary | ICD-10-CM | POA: Diagnosis not present

## 2020-07-13 DIAGNOSIS — R7303 Prediabetes: Secondary | ICD-10-CM | POA: Diagnosis not present

## 2020-07-13 DIAGNOSIS — I10 Essential (primary) hypertension: Secondary | ICD-10-CM | POA: Diagnosis not present

## 2020-07-13 DIAGNOSIS — G4710 Hypersomnia, unspecified: Secondary | ICD-10-CM | POA: Diagnosis not present

## 2020-07-13 DIAGNOSIS — E785 Hyperlipidemia, unspecified: Secondary | ICD-10-CM | POA: Diagnosis not present

## 2020-07-13 DIAGNOSIS — F324 Major depressive disorder, single episode, in partial remission: Secondary | ICD-10-CM | POA: Diagnosis not present

## 2020-07-13 DIAGNOSIS — R42 Dizziness and giddiness: Secondary | ICD-10-CM | POA: Diagnosis not present

## 2020-07-13 DIAGNOSIS — E559 Vitamin D deficiency, unspecified: Secondary | ICD-10-CM | POA: Diagnosis not present

## 2020-08-13 DIAGNOSIS — G4733 Obstructive sleep apnea (adult) (pediatric): Secondary | ICD-10-CM | POA: Diagnosis not present

## 2020-08-17 NOTE — Progress Notes (Signed)
Virtual Visit via Telephone Note   This visit type was conducted due to national recommendations for restrictions regarding the COVID-19 Pandemic (e.g. social distancing) in an effort to limit this patient's exposure and mitigate transmission in our community.  Due to her co-morbid illnesses, this patient is at least at moderate risk for complications without adequate follow up.  This format is felt to be most appropriate for this patient at this time.  The patient did not have access to video technology/had technical difficulties with video requiring transitioning to audio format only (telephone).  All issues noted in this document were discussed and addressed.  No physical exam could be performed with this format.  Please refer to the patient's chart for her  consent to telehealth for [facility].   Evaluation Performed:  Follow-up visit  This visit type was conducted due to national recommendations for restrictions regarding the COVID-19 Pandemic (e.g. social distancing).  This format is felt to be most appropriate for this patient at this time.  All issues noted in this document were discussed and addressed.  No physical exam was performed (except for noted visual exam findings with Video Visits).  Please refer to the patient's chart (MyChart message for video visits and phone note for telephone visits) for the patient's consent to telehealth for [facility].  Date:  08/18/2020   ID:  Debbie [name], DOB 08/05/1951, MRN [id number]  Patient Location:  Home  Provider location:   [place]  PCP:  Harlan Stains, MD  Cardiologist:  Fransico Him, MD  Electrophysiologist:  None   Chief Complaint:  HTN, HLD, CAD  History of Present Illness:    Debbie [name] is a 69 y.o. female who presents via audio/video conferencing for a telehealth visit today.    This is a 69yo female with a hx of HTN, HLD and ASCAD with coronary CTA 11/2019 with a calcium score of 146 and mild  nonobstructive plaque in the mid LAD.  She has been on ASA and statin.  She is here today for followup.  She tells me that for 2 weeks recently she had achiness all over her body, severe fatigue, chest heaviness, cough, DOE and was concerned that she might have COVID but did not get tested.  She is now feeling better.  Her DOE has completed resolved and has not had any further CP.  Prior to these "flu like symptoms" she denies any chest pain or pressure, PND, orthopnea, LE edema, dizziness (except when standing up from sitting), palpitations or syncope. She does have chronic DOE with walking up stairs carrying groceries which is stable.  She is compliant with her meds and is tolerating meds with no SE.    The patient does not have symptoms concerning for COVID-19 infection (fever, chills, cough, or new shortness of breath).    Prior CV studies:   The following studies were reviewed today:  Coronary CTA  Past Medical History:  Diagnosis Date  . Acid reflux   . ALLERGIC RHINITIS   . CAD (coronary artery disease), native coronary artery    coronary CTA 11/2019 with a calcium score of 146 and mild nonobstructive plaque in the mid LAD.  Marland Kitchen Depression   . Diverticulosis   . Esophageal stricture   . Hiatal hernia    with  Schatzki's Ring  . Hyperlipidemia   . Hypertension   . IBS (irritable bowel syndrome)   . Kidney stones    Past Surgical History:  Procedure Laterality Date  .  ABDOMINAL HYSTERECTOMY    . ANKLE FRACTURE SURGERY  2012   right  . COLONOSCOPY  multiple  . ESOPHAGOGASTRODUODENOSCOPY  09/14/05   multiple  . KIDNEY STONE SURGERY    . TONSILLECTOMY    . TUBAL LIGATION    . VESICOVAGINAL FISTULA CLOSURE W/ TAH       Current Meds  Medication Sig  . amLODipine (NORVASC) 5 MG tablet Take 5 mg by mouth daily.  Marland Kitchen aspirin EC 81 MG tablet Take 1 tablet (81 mg total) by mouth daily.  . Cholecalciferol (VITAMIN D-3) 1000 units CAPS Take 1,000 Units by mouth daily.  . Choline  Fenofibrate (TRILIPIX) 45 MG capsule Take 45 mg by mouth every morning.   . CYMBALTA 60 MG capsule Take 120 mg by mouth every morning.   . dicyclomine (BENTYL) 20 MG tablet Take 40 mg by mouth 2 (two) times daily.  . fluticasone (FLONASE) 50 MCG/ACT nasal spray Place 2 sprays into both nostrils daily as needed for allergies or rhinitis.   Marland Kitchen ibuprofen (ADVIL) 600 MG tablet Take 1 tablet (600 mg total) by mouth every 8 (eight) hours as needed.  Marland Kitchen KLOR-CON M20 20 MEQ tablet Take 40 mEq by mouth every morning.   Marland Kitchen levocetirizine (XYZAL) 5 MG tablet Take 5 mg by mouth every morning.  Marland Kitchen lisinopril-hydrochlorothiazide (PRINZIDE,ZESTORETIC) 20-12.5 MG tablet Take 1 tablet by mouth daily. (Patient taking differently: Take 0.5 tablets by mouth daily. )  . metoprolol tartrate (LOPRESSOR) 50 MG tablet Take 1 tablet (50 mg total) by mouth 2 (two) times daily.  . Multiple Vitamin (MULTIVITAMIN) capsule Take 1 capsule by mouth every morning.   . pantoprazole (PROTONIX) 40 MG tablet Take 40 mg by mouth 2 (two) times daily.  Marland Kitchen saccharomyces boulardii (FLORASTOR) 250 MG capsule Take 1 capsule (250 mg total) by mouth 2 (two) times daily.     Allergies:   Patient has no known allergies.   Social History   Tobacco Use  . Smoking status: Never Smoker  . Smokeless tobacco: Never Used  Vaping Use  . Vaping Use: Never used  Substance Use Topics  . Alcohol use: No  . Drug use: No     Family Hx: The patient's family history includes Allergies in her daughter, mother, and son; Diabetes in her paternal grandmother; Heart disease in her father and mother; Hyperlipidemia in her father and mother; Hypertension in her father and mother; Stroke in her father. There is no history of Colon cancer, Esophageal cancer, Rectal cancer, or Stomach cancer.  ROS:   Please see the history of present illness.     All other systems reviewed and are negative.   Labs/Other Tests and Data Reviewed:    Recent Labs: 09/19/2019:  BUN 11; Potassium 3.9; Sodium 142 11/07/2019: Creatinine, Ser 0.60   Recent Lipid Panel No results found for: CHOL, TRIG, HDL, CHOLHDL, LDLCALC, LDLDIRECT  Wt Readings from Last 3 Encounters:  08/18/20 165 lb (74.8 kg)  12/18/19 167 lb (75.8 kg)  09/19/19 167 lb 12.8 oz (76.1 kg)     Objective:    Vital Signs:  BP (!) 150/83   Ht 5\' 3"  (1.6 m)   Wt 165 lb (74.8 kg)   BMI 29.23 kg/m     ASSESSMENT & PLAN:    1.  ASCAD -coronary CTA 11/2019 with a calcium score of 146 and mild nonobstructive plaque in the mid LAD. -she denies any anginal symptoms but recently had a "viral syndrome" and had severe fatigue, body  aches, chest tightness and SOB.  These symptoms have resolved and suspect it was a viral syndrome -I will get a 2D echo to assess LVF -continue ASA, BB  2.  HTN -BP borderline controlled -continue Lisinopril-HCT 20-12.5mg  daily, amlodipine 5mg  daily and Lopressor 50mg  BID.   -check CMET -check BP daily around lunch for a week and call with results  3.  HLD -LDL goal < 70 -check FLP and ALT in 2 weeks -continue fenofibrate 45mg  daily and atorvastatin 80mg  daily  COVID-19 Education: She has had both COVID 19 vaccines  Patient Risk:   After full review of this patient's clinical status, I feel that they are at least moderate risk at this time.  Time:   Today, I have spent 20 minutes on telemedicine discussing medical problems including CAD, HTN, HLD and reviewing patient's chart including coronary CTA and labs.  Medication Adjustments/Labs and Tests Ordered: Current medicines are reviewed at length with the patient today.  Concerns regarding medicines are outlined above.  Tests Ordered: No orders of the defined types were placed in this encounter.  Medication Changes: No orders of the defined types were placed in this encounter.   Disposition:  Follow up in 1 year(s)  Signed, Fransico Him, MD  08/18/2020 8:13 AM    [facility]

## 2020-08-18 ENCOUNTER — Other Ambulatory Visit: Payer: Self-pay

## 2020-08-18 ENCOUNTER — Encounter: Payer: Self-pay | Admitting: Cardiology

## 2020-08-18 ENCOUNTER — Telehealth (INDEPENDENT_AMBULATORY_CARE_PROVIDER_SITE_OTHER): Payer: Medicare HMO | Admitting: Cardiology

## 2020-08-18 VITALS — BP 150/83 | Ht 63.0 in | Wt 165.0 lb

## 2020-08-18 DIAGNOSIS — E7800 Pure hypercholesterolemia, unspecified: Secondary | ICD-10-CM

## 2020-08-18 DIAGNOSIS — R0602 Shortness of breath: Secondary | ICD-10-CM | POA: Diagnosis not present

## 2020-08-18 DIAGNOSIS — I2583 Coronary atherosclerosis due to lipid rich plaque: Secondary | ICD-10-CM

## 2020-08-18 DIAGNOSIS — I2510 Atherosclerotic heart disease of native coronary artery without angina pectoris: Secondary | ICD-10-CM | POA: Diagnosis not present

## 2020-08-18 DIAGNOSIS — I10 Essential (primary) hypertension: Secondary | ICD-10-CM

## 2020-08-18 NOTE — Addendum Note (Signed)
Addended by: Antonieta Iba on: 08/18/2020 09:04 AM   Modules accepted: Orders

## 2020-08-18 NOTE — Patient Instructions (Signed)
Please take your blood pressure daily for one week and send [place] a MyChart message or call us with your list of readings.   Medication Instructions:  Your physician recommends that you continue on your current medications as directed. Please refer to the Current Medication list given to you today.  *If you need a refill on your cardiac medications before your next appointment, please call your pharmacy*  Lab Work: CMET and fasting lipids in one week.  If you have labs (blood work) drawn today and your tests are completely normal, you will receive your results only by: Marland Kitchen MyChart Message (if you have MyChart) OR . A paper copy in the mail If you have any lab test that is abnormal or we need to change your treatment, we will call you to review the results.   Testing/Procedures: Your physician has requested that you have an echocardiogram. Echocardiography is a painless test that uses sound waves to create images of your heart. It provides your doctor with information about the size and shape of your heart and how well your heart's chambers and valves are working. This procedure takes approximately one hour. There are no restrictions for this procedure.  Follow-Up: At [facility], you and your health needs are our priority.  As part of our continuing mission to provide you with exceptional heart care, we have created designated Provider Care Teams.  These Care Teams include your primary Cardiologist (physician) and Advanced Practice Providers (APPs -  Physician Assistants and Nurse Practitioners) who all work together to provide you with the care you need, when you need it.  Your next appointment:   1 year(s)  The format for your next appointment:   In Person  Provider:   You may see Fransico Him, MD or one of the following Advanced Practice Providers on your designated Care Team:    Melina Copa, PA-C  Ermalinda Barrios, PA-C

## 2020-08-21 DIAGNOSIS — I10 Essential (primary) hypertension: Secondary | ICD-10-CM | POA: Diagnosis not present

## 2020-08-21 DIAGNOSIS — R1319 Other dysphagia: Secondary | ICD-10-CM | POA: Diagnosis not present

## 2020-08-21 DIAGNOSIS — R42 Dizziness and giddiness: Secondary | ICD-10-CM | POA: Diagnosis not present

## 2020-08-21 DIAGNOSIS — F419 Anxiety disorder, unspecified: Secondary | ICD-10-CM | POA: Diagnosis not present

## 2020-08-25 ENCOUNTER — Other Ambulatory Visit: Payer: Self-pay | Admitting: Physician Assistant

## 2020-08-25 DIAGNOSIS — R1310 Dysphagia, unspecified: Secondary | ICD-10-CM

## 2020-08-26 ENCOUNTER — Ambulatory Visit
Admission: RE | Admit: 2020-08-26 | Discharge: 2020-08-26 | Disposition: A | Discharge status: Home / Self Care | Payer: Medicare HMO | Source: Ambulatory Visit | Attending: Physician Assistant | Admitting: Physician Assistant

## 2020-08-26 DIAGNOSIS — K449 Diaphragmatic hernia without obstruction or gangrene: Secondary | ICD-10-CM | POA: Diagnosis not present

## 2020-08-26 DIAGNOSIS — R1310 Dysphagia, unspecified: Secondary | ICD-10-CM

## 2020-08-26 DIAGNOSIS — K224 Dyskinesia of esophagus: Secondary | ICD-10-CM | POA: Diagnosis not present

## 2020-08-31 ENCOUNTER — Other Ambulatory Visit: Payer: Self-pay | Admitting: Physician Assistant

## 2020-08-31 DIAGNOSIS — K449 Diaphragmatic hernia without obstruction or gangrene: Secondary | ICD-10-CM | POA: Diagnosis not present

## 2020-08-31 DIAGNOSIS — R0602 Shortness of breath: Secondary | ICD-10-CM

## 2020-08-31 DIAGNOSIS — R1310 Dysphagia, unspecified: Secondary | ICD-10-CM | POA: Diagnosis not present

## 2020-08-31 DIAGNOSIS — K219 Gastro-esophageal reflux disease without esophagitis: Secondary | ICD-10-CM | POA: Diagnosis not present

## 2020-08-31 DIAGNOSIS — K224 Dyskinesia of esophagus: Secondary | ICD-10-CM | POA: Diagnosis not present

## 2020-08-31 DIAGNOSIS — R1084 Generalized abdominal pain: Secondary | ICD-10-CM | POA: Diagnosis not present

## 2020-09-01 ENCOUNTER — Other Ambulatory Visit (HOSPITAL_COMMUNITY): Payer: Medicare HMO

## 2020-09-03 DIAGNOSIS — E782 Mixed hyperlipidemia: Secondary | ICD-10-CM | POA: Diagnosis not present

## 2020-09-03 DIAGNOSIS — E785 Hyperlipidemia, unspecified: Secondary | ICD-10-CM | POA: Diagnosis not present

## 2020-09-03 DIAGNOSIS — I10 Essential (primary) hypertension: Secondary | ICD-10-CM | POA: Diagnosis not present

## 2020-09-03 DIAGNOSIS — F324 Major depressive disorder, single episode, in partial remission: Secondary | ICD-10-CM | POA: Diagnosis not present

## 2020-09-03 DIAGNOSIS — F4321 Adjustment disorder with depressed mood: Secondary | ICD-10-CM | POA: Diagnosis not present

## 2020-09-04 ENCOUNTER — Other Ambulatory Visit: Payer: Medicare HMO | Admitting: *Deleted

## 2020-09-04 ENCOUNTER — Ambulatory Visit (HOSPITAL_COMMUNITY): Payer: Medicare HMO | Attending: Cardiology

## 2020-09-04 ENCOUNTER — Other Ambulatory Visit: Payer: Self-pay

## 2020-09-04 DIAGNOSIS — E7800 Pure hypercholesterolemia, unspecified: Secondary | ICD-10-CM

## 2020-09-04 DIAGNOSIS — R0602 Shortness of breath: Secondary | ICD-10-CM | POA: Diagnosis not present

## 2020-09-04 DIAGNOSIS — I10 Essential (primary) hypertension: Secondary | ICD-10-CM

## 2020-09-04 LAB — COMPREHENSIVE METABOLIC PANEL
ALT: 20 IU/L (ref 0–32)
AST: 22 IU/L (ref 0–40)
Albumin/Globulin Ratio: 1.9 (ref 1.2–2.2)
Albumin: 4.4 g/dL (ref 3.8–4.8)
Alkaline Phosphatase: 81 IU/L (ref 44–121)
BUN/Creatinine Ratio: 15 (ref 12–28)
BUN: 15 mg/dL (ref 8–27)
Bilirubin Total: 0.3 mg/dL (ref 0.0–1.2)
CO2: 29 mmol/L (ref 20–29)
Calcium: 9.6 mg/dL (ref 8.7–10.3)
Chloride: 97 mmol/L (ref 96–106)
Creatinine, Ser: 1.01 mg/dL — ABNORMAL HIGH (ref 0.57–1.00)
GFR calc Af Amer: 67 mL/min/{1.73_m2} (ref >59)
GFR calc non Af Amer: 58 mL/min/{1.73_m2} — ABNORMAL LOW (ref >59)
Globulin, Total: 2.3 g/dL (ref 1.5–4.5)
Glucose: 85 mg/dL (ref 65–99)
Potassium: 3.3 mmol/L — ABNORMAL LOW (ref 3.5–5.2)
Sodium: 142 mmol/L (ref 134–144)
Total Protein: 6.7 g/dL (ref 6.0–8.5)

## 2020-09-04 LAB — LIPID PANEL
Chol/HDL Ratio: 3.1 ratio (ref 0.0–4.4)
Cholesterol, Total: 182 mg/dL (ref 100–199)
HDL: 59 mg/dL (ref >39)
LDL Chol Calc (NIH): 96 mg/dL (ref 0–99)
Triglycerides: 154 mg/dL — ABNORMAL HIGH (ref 0–149)
VLDL Cholesterol Cal: 27 mg/dL (ref 5–40)

## 2020-09-04 LAB — ECHOCARDIOGRAM COMPLETE
Area-P 1/2: 3.63 cm2
S' Lateral: 2.95 cm

## 2020-09-04 MED ORDER — PERFLUTREN LIPID MICROSPHERE
1.0000 mL | INTRAVENOUS | Status: AC | PRN
  Administered 2020-09-04: 4 mL via INTRAVENOUS

## 2020-09-07 ENCOUNTER — Telehealth: Payer: Self-pay

## 2020-09-07 DIAGNOSIS — I10 Essential (primary) hypertension: Secondary | ICD-10-CM

## 2020-09-07 DIAGNOSIS — I2583 Coronary atherosclerosis due to lipid rich plaque: Secondary | ICD-10-CM

## 2020-09-07 DIAGNOSIS — E7800 Pure hypercholesterolemia, unspecified: Secondary | ICD-10-CM

## 2020-09-07 DIAGNOSIS — I2510 Atherosclerotic heart disease of native coronary artery without angina pectoris: Secondary | ICD-10-CM

## 2020-09-07 DIAGNOSIS — R072 Precordial pain: Secondary | ICD-10-CM

## 2020-09-07 DIAGNOSIS — R0602 Shortness of breath: Secondary | ICD-10-CM

## 2020-09-07 MED ORDER — EZETIMIBE 10 MG PO TABS: 10.0000 mg | ORAL_TABLET | Freq: Every day | ORAL | 3 refills | Status: DC

## 2020-09-07 MED ORDER — POTASSIUM CHLORIDE CRYS ER 20 MEQ PO TBCR: 40.0000 meq | EXTENDED_RELEASE_TABLET | Freq: Two times a day (BID) | ORAL | 3 refills | Status: DC

## 2020-09-07 NOTE — Telephone Encounter (Signed)
-----   Message from [name], MD sent at 09/07/2020  2:59 PM EDT ----- Agree with plan, Percival Spanish please order ----- Message ----- From: Ramond Dial, RPH-CPP Sent: 09/07/2020  12:41 PM EDT To: [name], MD, Antonieta Iba, RN  Would recommend adding zetia 10mg  daily. Try to limit sugars (especially sugary drinks) alcohol and carbs. Recheck lipids in 3 months

## 2020-09-07 NOTE — Telephone Encounter (Signed)
Spoke with the patient who is aware of recommendations. She will increase Klor Con to 40 meq BID. She will start zetia 10 mg daily. She will come in on Friday for all lab work.  Carlton Adam has been ordered and patient is aware that she will be called to have it scheduled.

## 2020-09-07 NOTE — Telephone Encounter (Signed)
Left message for patient to call back.   [name], MD  Antonieta Iba, RN Increase Klor con to 57meq BID and repeat BMEt on Friday  [name], MD  Antonieta Iba, RN Add on Mag to BMET on Friday  [name], MD  Antonieta Iba, RN Please have her come in for TSH, BNp, CRP and sed rate  Turner, Eber Hong, MD  Antonieta Iba, RN Also get a Leane Call to rule out ischemia

## 2020-09-09 ENCOUNTER — Encounter (HOSPITAL_COMMUNITY): Payer: Self-pay | Admitting: *Deleted

## 2020-09-09 ENCOUNTER — Telehealth (HOSPITAL_COMMUNITY): Payer: Self-pay | Admitting: *Deleted

## 2020-09-09 NOTE — Telephone Encounter (Signed)
Patient given detailed instructions per Myocardial Perfusion Study Information Sheet for the test on 09/16/2020 at 1015. Patient notified to arrive 15 minutes early and that it is imperative to arrive on time for appointment to keep from having the test rescheduled.  If you need to cancel or reschedule your appointment, please call the office within 24 hours of your appointment. . Patient verbalized understanding.Darrielle Pflieger, Lucius Conn letter sent with instructions.

## 2020-09-11 ENCOUNTER — Other Ambulatory Visit: Payer: Self-pay

## 2020-09-11 ENCOUNTER — Other Ambulatory Visit: Payer: Medicare HMO | Admitting: *Deleted

## 2020-09-11 DIAGNOSIS — I10 Essential (primary) hypertension: Secondary | ICD-10-CM

## 2020-09-11 DIAGNOSIS — E7800 Pure hypercholesterolemia, unspecified: Secondary | ICD-10-CM

## 2020-09-11 DIAGNOSIS — F324 Major depressive disorder, single episode, in partial remission: Secondary | ICD-10-CM | POA: Diagnosis not present

## 2020-09-11 DIAGNOSIS — Z0000 Encounter for general adult medical examination without abnormal findings: Secondary | ICD-10-CM | POA: Diagnosis not present

## 2020-09-11 DIAGNOSIS — F4321 Adjustment disorder with depressed mood: Secondary | ICD-10-CM | POA: Diagnosis not present

## 2020-09-11 DIAGNOSIS — R0602 Shortness of breath: Secondary | ICD-10-CM

## 2020-09-11 DIAGNOSIS — R7303 Prediabetes: Secondary | ICD-10-CM | POA: Diagnosis not present

## 2020-09-11 DIAGNOSIS — I2583 Coronary atherosclerosis due to lipid rich plaque: Secondary | ICD-10-CM | POA: Diagnosis not present

## 2020-09-11 DIAGNOSIS — I2510 Atherosclerotic heart disease of native coronary artery without angina pectoris: Secondary | ICD-10-CM | POA: Diagnosis not present

## 2020-09-11 DIAGNOSIS — E782 Mixed hyperlipidemia: Secondary | ICD-10-CM | POA: Diagnosis not present

## 2020-09-11 DIAGNOSIS — E559 Vitamin D deficiency, unspecified: Secondary | ICD-10-CM | POA: Diagnosis not present

## 2020-09-11 DIAGNOSIS — E2839 Other primary ovarian failure: Secondary | ICD-10-CM | POA: Diagnosis not present

## 2020-09-11 DIAGNOSIS — R072 Precordial pain: Secondary | ICD-10-CM | POA: Diagnosis not present

## 2020-09-11 DIAGNOSIS — R42 Dizziness and giddiness: Secondary | ICD-10-CM | POA: Diagnosis not present

## 2020-09-11 DIAGNOSIS — E785 Hyperlipidemia, unspecified: Secondary | ICD-10-CM | POA: Diagnosis not present

## 2020-09-11 DIAGNOSIS — Z23 Encounter for immunization: Secondary | ICD-10-CM | POA: Diagnosis not present

## 2020-09-12 LAB — BASIC METABOLIC PANEL
BUN/Creatinine Ratio: 14 (ref 12–28)
BUN: 14 mg/dL (ref 8–27)
CO2: 27 mmol/L (ref 20–29)
Calcium: 9.9 mg/dL (ref 8.7–10.3)
Chloride: 98 mmol/L (ref 96–106)
Creatinine, Ser: 0.97 mg/dL (ref 0.57–1.00)
GFR calc Af Amer: 70 mL/min/{1.73_m2} (ref >59)
GFR calc non Af Amer: 61 mL/min/{1.73_m2} (ref >59)
Glucose: 94 mg/dL (ref 65–99)
Potassium: 4 mmol/L (ref 3.5–5.2)
Sodium: 141 mmol/L (ref 134–144)

## 2020-09-12 LAB — MAGNESIUM: Magnesium: 1.3 mg/dL — ABNORMAL LOW (ref 1.6–2.3)

## 2020-09-12 LAB — TSH: TSH: 1.31 u[IU]/mL (ref 0.450–4.500)

## 2020-09-12 LAB — SEDIMENTATION RATE: Sed Rate: 2 mm/hr (ref 0–40)

## 2020-09-12 LAB — C-REACTIVE PROTEIN: CRP: 5 mg/L (ref 0–10)

## 2020-09-12 LAB — PRO B NATRIURETIC PEPTIDE: NT-Pro BNP: 36 pg/mL (ref 0–301)

## 2020-09-14 ENCOUNTER — Other Ambulatory Visit: Payer: Self-pay

## 2020-09-14 ENCOUNTER — Ambulatory Visit
Admission: RE | Admit: 2020-09-14 | Discharge: 2020-09-14 | Disposition: A | Discharge status: Home / Self Care | Payer: Medicare HMO | Source: Ambulatory Visit | Attending: Physician Assistant | Admitting: Physician Assistant

## 2020-09-14 ENCOUNTER — Other Ambulatory Visit: Payer: Medicare HMO

## 2020-09-14 DIAGNOSIS — J479 Bronchiectasis, uncomplicated: Secondary | ICD-10-CM | POA: Diagnosis not present

## 2020-09-14 DIAGNOSIS — R0602 Shortness of breath: Secondary | ICD-10-CM

## 2020-09-14 DIAGNOSIS — K449 Diaphragmatic hernia without obstruction or gangrene: Secondary | ICD-10-CM | POA: Diagnosis not present

## 2020-09-14 DIAGNOSIS — I700 Atherosclerosis of aorta: Secondary | ICD-10-CM | POA: Diagnosis not present

## 2020-09-14 DIAGNOSIS — K7689 Other specified diseases of liver: Secondary | ICD-10-CM | POA: Diagnosis not present

## 2020-09-14 MED ORDER — IOPAMIDOL (ISOVUE-300) INJECTION 61%
100.0000 mL | Freq: Once | INTRAVENOUS | Status: AC | PRN
  Administered 2020-09-14: 100 mL via INTRAVENOUS

## 2020-09-16 ENCOUNTER — Other Ambulatory Visit: Payer: Self-pay

## 2020-09-16 ENCOUNTER — Ambulatory Visit (HOSPITAL_COMMUNITY): Payer: Medicare HMO | Attending: Cardiology

## 2020-09-16 DIAGNOSIS — R072 Precordial pain: Secondary | ICD-10-CM | POA: Insufficient documentation

## 2020-09-16 LAB — MYOCARDIAL PERFUSION IMAGING
LV dias vol: 63 mL (ref 46–106)
LV sys vol: 28 mL
Peak HR: 98 {beats}/min
Rest HR: 83 {beats}/min
SDS: 2
SRS: 2
SSS: 5
TID: 1.05

## 2020-09-16 MED ORDER — TECHNETIUM TC 99M TETROFOSMIN IV KIT
10.7000 | PACK | Freq: Once | INTRAVENOUS | Status: AC | PRN
  Administered 2020-09-16: 10.7 via INTRAVENOUS
  Filled 2020-09-16: qty 11

## 2020-09-16 MED ORDER — REGADENOSON 0.4 MG/5ML IV SOLN
0.4000 mg | Freq: Once | INTRAVENOUS | Status: AC
  Administered 2020-09-16: 0.4 mg via INTRAVENOUS

## 2020-09-16 MED ORDER — TECHNETIUM TC 99M TETROFOSMIN IV KIT
32.5000 | PACK | Freq: Once | INTRAVENOUS | Status: AC | PRN
  Administered 2020-09-16: 32.5 via INTRAVENOUS
  Filled 2020-09-16: qty 33

## 2020-09-17 ENCOUNTER — Other Ambulatory Visit: Payer: Self-pay | Admitting: Family Medicine

## 2020-09-17 DIAGNOSIS — E2839 Other primary ovarian failure: Secondary | ICD-10-CM

## 2020-09-18 ENCOUNTER — Telehealth: Payer: Self-pay | Admitting: *Deleted

## 2020-09-18 DIAGNOSIS — I10 Essential (primary) hypertension: Secondary | ICD-10-CM

## 2020-09-18 NOTE — Telephone Encounter (Signed)
-----   Message from [name], MD sent at 09/13/2020  3:26 PM EDT ----- All labs are very low except for mag which is very low - please verify that patient has been taking her magnesium

## 2020-09-18 NOTE — Telephone Encounter (Signed)
I spoke with patient and reviewed lab results with her.  She had forgotten to take magnesium for several months and just resumed on 09/11/20 after lab work was done.  I told patient I would make Dr Radford Pax aware and we would call her back if Dr Radford Pax wanted to arrange follow up lab work

## 2020-09-19 NOTE — Telephone Encounter (Signed)
Repeat Mag level in 1 week

## 2020-09-21 DIAGNOSIS — K219 Gastro-esophageal reflux disease without esophagitis: Secondary | ICD-10-CM | POA: Diagnosis not present

## 2020-09-21 DIAGNOSIS — K589 Irritable bowel syndrome without diarrhea: Secondary | ICD-10-CM | POA: Diagnosis not present

## 2020-09-21 DIAGNOSIS — R1310 Dysphagia, unspecified: Secondary | ICD-10-CM | POA: Diagnosis not present

## 2020-09-21 DIAGNOSIS — K449 Diaphragmatic hernia without obstruction or gangrene: Secondary | ICD-10-CM | POA: Diagnosis not present

## 2020-09-21 DIAGNOSIS — F419 Anxiety disorder, unspecified: Secondary | ICD-10-CM | POA: Diagnosis not present

## 2020-09-21 NOTE — Telephone Encounter (Signed)
Spoke with the patient and she will repeat Mg on Friday 10/22.

## 2020-09-25 ENCOUNTER — Other Ambulatory Visit: Payer: Medicare HMO

## 2020-10-07 ENCOUNTER — Other Ambulatory Visit: Payer: Medicare HMO | Admitting: *Deleted

## 2020-10-07 ENCOUNTER — Other Ambulatory Visit: Payer: Self-pay

## 2020-10-07 DIAGNOSIS — I10 Essential (primary) hypertension: Secondary | ICD-10-CM

## 2020-10-07 LAB — MAGNESIUM: Magnesium: 1.9 mg/dL (ref 1.6–2.3)

## 2020-10-26 DIAGNOSIS — K219 Gastro-esophageal reflux disease without esophagitis: Secondary | ICD-10-CM | POA: Diagnosis not present

## 2020-10-26 DIAGNOSIS — E782 Mixed hyperlipidemia: Secondary | ICD-10-CM | POA: Diagnosis not present

## 2020-10-26 DIAGNOSIS — F324 Major depressive disorder, single episode, in partial remission: Secondary | ICD-10-CM | POA: Diagnosis not present

## 2020-10-26 DIAGNOSIS — I10 Essential (primary) hypertension: Secondary | ICD-10-CM | POA: Diagnosis not present

## 2020-10-26 DIAGNOSIS — E785 Hyperlipidemia, unspecified: Secondary | ICD-10-CM | POA: Diagnosis not present

## 2020-10-26 DIAGNOSIS — F4321 Adjustment disorder with depressed mood: Secondary | ICD-10-CM | POA: Diagnosis not present

## 2020-11-03 ENCOUNTER — Telehealth: Payer: Self-pay

## 2020-11-03 DIAGNOSIS — I2510 Atherosclerotic heart disease of native coronary artery without angina pectoris: Secondary | ICD-10-CM

## 2020-11-03 DIAGNOSIS — E7800 Pure hypercholesterolemia, unspecified: Secondary | ICD-10-CM

## 2020-11-03 DIAGNOSIS — R0602 Shortness of breath: Secondary | ICD-10-CM

## 2020-11-03 DIAGNOSIS — I10 Essential (primary) hypertension: Secondary | ICD-10-CM

## 2020-11-03 DIAGNOSIS — I2583 Coronary atherosclerosis due to lipid rich plaque: Secondary | ICD-10-CM

## 2020-11-03 DIAGNOSIS — R072 Precordial pain: Secondary | ICD-10-CM

## 2020-11-03 NOTE — Telephone Encounter (Signed)
-----   Message from Ramond Dial, [place] sent at 11/03/2020  7:32 AM EST -----  ----- Message ----- From: Ramond Dial, RPH-CPP Sent: 11/03/2020 To: Ramond Dial, RPH-CPP  Set up lipid panel

## 2020-11-03 NOTE — Telephone Encounter (Signed)
Called and lmomed the pt stated that lipid panel was needed ad to call us back asap please

## 2020-11-04 ENCOUNTER — Ambulatory Visit: Payer: Self-pay | Admitting: Surgery

## 2020-11-04 ENCOUNTER — Telehealth: Payer: Self-pay | Admitting: *Deleted

## 2020-11-04 DIAGNOSIS — K449 Diaphragmatic hernia without obstruction or gangrene: Secondary | ICD-10-CM | POA: Diagnosis not present

## 2020-11-04 NOTE — H&P (Signed)
Debbie [name] Appointment: 11/04/2020 10:00 AM Location: [facility] Patient #: [id number] DOB: [birth year] Married / Language: Cleophus Molt / Race: White Female  History of Present Illness Felicie Morn MD; 11/04/2020 10:17 AM) Patient words: Debbie [name] is a 69 year old female who presents to the office due to dysphagia, found to have a hiatal hernia.  Her primary complaint appears to be dysphagia. She has significant difficulty with bread and meats. The pain that this dysphagia has caused prompted a cardiac workup which was negative. The thought is her hiatal hernia is her issue. She takes Protonix for acid reflux with some relief, however some of her reflux symptoms may actually be due to stasis of food and regurgitation.  The patient is a 69 year old female.   Past Surgical History Emeline Gins, [place]; 11/04/2020 9:54 AM) Breast Biopsy Right. Colon Polyp Removal - Colonoscopy Foot Surgery Right. Hysterectomy (not due to cancer) - Complete Hysterectomy (not due to cancer) - Partial Oral Surgery Tonsillectomy  Diagnostic Studies History Emeline Gins, [place]; 11/04/2020 9:54 AM) Colonoscopy 1-5 years ago Mammogram within last year Pap Smear >5 years ago  Allergies Emeline Gins, [place]; 11/04/2020 9:55 AM) No Known Drug Allergies [11/04/2020]: Allergies Reconciled  Medication History Emeline Gins, CMA; 11/04/2020 9:57 AM) Aspirin (81MG  Tablet, Oral) Active. Dicyclomine HCl (20MG  Tablet, Oral) Active. DULoxetine HCl (60MG  Capsule DR Part, Oral) Active. Potassium Chloride ER (10MEQ Tablet ER, Oral) Active. Lisinopril-hydroCHLOROthiazide (10-12.5MG  Tablet, Oral) Active. Multi Vitamin (Oral) Active. Vitamin D3 (25 MCG(1000 UT) Capsule, Oral) Active. Pantoprazole Sodium (40MG  Tablet DR, Oral) Active. Atorvastatin Calcium (40MG  Tablet, Oral) Active. Ezetimibe (10MG  Tablet, Oral) Active. Famotidine (20MG  Tablet, Oral) Active. ZyrTEC  Allergy (10MG  Tablet, Oral) Active. Medications Reconciled  Social History Emeline Gins, [place]; 11/04/2020 9:54 AM) Caffeine use Carbonated beverages. No alcohol use No drug use Tobacco use Never smoker.  Family History Emeline Gins, [place]; 11/04/2020 9:54 AM) Alcohol Abuse Brother. Arthritis Father, Mother. Cerebrovascular Accident Father. Depression Daughter, Mother. Heart Disease Father, Mother. Heart disease in female family member before age 39 Heart disease in female family member before age 21 Hypertension Brother, Daughter, Father, Mother, Sister.  Pregnancy / Birth History Emeline Gins, [place]; 11/04/2020 9:54 AM) Age at menarche 40 years. Age of menopause 55-50 Contraceptive History Oral contraceptives. Gravida 3 Irregular periods Maternal age 5-20 Para 3  Other Problems Emeline Gins, [place]; 11/04/2020 9:54 AM) Depression Gastroesophageal Reflux Disease Hypercholesterolemia Kidney Stone Sleep Apnea     Review of Systems Emeline Gins CMA; 11/04/2020 9:54 AM) General Present- Fatigue. Not Present- Appetite Loss, Chills, Fever, Night Sweats, Weight Gain and Weight Loss. HEENT Present- Hearing Loss, Hoarseness, Seasonal Allergies, Sinus Pain, Sore Throat and Wears glasses/contact lenses. Not Present- Earache, Nose Bleed, Oral Ulcers, Ringing in the Ears, Visual Disturbances and Yellow Eyes. Respiratory Present- Chronic Cough and Difficulty Breathing. Not Present- Bloody sputum, Snoring and Wheezing. Breast Not Present- Breast Mass, Breast Pain, Nipple Discharge and Skin Changes. Cardiovascular Present- Chest Pain. Not Present- Difficulty Breathing Lying Down, Leg Cramps, Palpitations, Rapid Heart Rate, Shortness of Breath and Swelling of Extremities. Gastrointestinal Present- Bloating, Difficulty Swallowing, Gets full quickly at meals and Indigestion. Not Present- Abdominal Pain, Bloody Stool, Change in Bowel Habits, Chronic diarrhea,  Constipation, Excessive gas, Hemorrhoids, Nausea, Rectal Pain and Vomiting. Female Genitourinary Not Present- Frequency, Nocturia, Painful Urination, Pelvic Pain and Urgency. Neurological Present- Fainting. Not Present- Decreased Memory, Headaches, Numbness, Seizures, Tingling, Tremor, Trouble walking and Weakness. Endocrine Not Present- Cold Intolerance, Excessive Hunger, Hair Changes, Heat Intolerance, Hot flashes and  New Diabetes.  Vitals Emeline Gins CMA; 11/04/2020 9:55 AM) 11/04/2020 9:54 AM Weight: 170.8 lb Height: 63in Body Surface Area: 1.81 m Body Mass Index: 30.26 kg/m  Temp.: 97.30F  Pulse: 104 (Regular)  BP: 140/86(Sitting, Left Arm, Standard)        Physical Exam Felicie Morn MD; 11/04/2020 10:17 AM)  General Mental Status-Alert. General Appearance-Consistent with stated age. Posture-Normal posture. Voice-Normal.  Integumentary Note: no rash or lesion on limited exam  Head and Neck Head -Note:atraumatic, normocephalic.  Face Strength and Tone - facial muscle strength and tone is normal.  Eye Eyeball - Bilateral-Extraocular movements intact. Sclera/Conjunctiva - Bilateral-Normal.  Chest and Lung Exam Chest and lung exam reveals -quiet, even and easy respiratory effort with no use of accessory muscles.  Neuropsychiatric Mental status exam performed with findings of-able to articulate well with normal speech/language, rate, volume and coherence. The patient's mood and affect are described as -normal. Judgment and Insight-insight is appropriate concerning matters relevant to self and the patient displays appropriate judgment regarding every day activities. Thought Processes/Cognitive Function-aware of current events.  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 Episodes of spontaneous and inducible GE reflux. Esophageal dysmotility. No esophageal mass or stricture.  CT Chest/Abd/Pelv 09/14/20 - Moderate hiatal hernia with intrathoracic position of the gastric body and fundus. Minimal, generally bandlike scarring and/or partial atelectasis of the lung bases. Mild, tubular bronchiectasis most conspicuous in the right lower lobe. Findings are most likely related to prior infection or aspiration without specific features to suggest fibrotic interstitial lung disease. Aortic Atherosclerosis. Impression: Debbie [name] is a 69 year old female who presents to surgery office due to a hiatal hernia. The recommendation was made to proceed with robotic hiatal hernia repair with possible partial fundoplication. The risks, benefits and alternatives were discussed. We discussed her esophageal dysmotility on upper GI, and the decision process I will make an surgery on whether to perform a partial fundoplication were no fundoplication. We also discussed the possibility of using a gastrostomy tube. We discussed the risk of dysphagia and reflux after surgery. We will ask for preoperative cardiac evaluation and then proceed with surgery. The surgery scheduler will call you to schedule surgery.

## 2020-11-04 NOTE — Telephone Encounter (Signed)
Left VM

## 2020-11-04 NOTE — Telephone Encounter (Signed)
° °  [facility] Pre-operative Risk Assessment    HEARTCARE STAFF: - Please ensure there is not already an duplicate clearance open for this procedure. - Under Visit Info/Reason for Call, type in Other and utilize the format Clearance MM/DD/YY or Clearance TBD. Do not use dashes or single digits. - If request is for dental extraction, please clarify the # of teeth to be extracted.  Request for surgical clearance:  1. What type of surgery is being performed? ROBOTIC HIATAL HERNIA REPAIR   2. When is this surgery scheduled? TBD    3. What type of clearance is required (medical clearance vs. Pharmacy clearance to hold med vs. Both)? MEDICAL  4. Are there any medications that need to be held prior to surgery and how long? ASA   5. Practice name and name of physician performing surgery? [facility]; DR. Somers   6. What is the office phone number? [phone]   7.   What is the office fax number? [place], CMA  8.   Anesthesia type (None, local, MAC, general) ? GENERAL   Julaine Hua 11/04/2020, 11:58 AM  _________________________________________________________________   (provider comments below)

## 2020-11-05 NOTE — Telephone Encounter (Signed)
Patient is calling to discuss clearance.

## 2020-11-06 NOTE — Telephone Encounter (Signed)
Left voice message 11/06/2020 at 9:30 AM.  Needs call back

## 2020-11-09 NOTE — Telephone Encounter (Signed)
OK to proceed with surgery - I think her chest discomfort is GI related

## 2020-11-09 NOTE — Telephone Encounter (Signed)
Hi Dr. Radford Pax,  Debbie [name] has upcoming robotic hiatal hernia repair planned (dat TBD). She was recently seen by you for a virtual visit in 08/2020 at which time she reported she recently had myalgia, severe fatigue, chest heaviness, cough, and DOE a few weeks prior but symptoms had resolved. It was felt she likely had a viral illness but Echo was ordered for further evaluation. Echo showed LVEF of 60-65% with grade 1 diastolic dysfunction. When she was called with results, she reported she was still having DOE and chest pain so Myoview was ordered and showed no evidence of ischemia. 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 I called and spoke with her today. She continues to have chest pain and dyspnea on exertion. Pain occurs after meals but also with activity such as carrying in the groceries.   1. Do you feel she is OK to proceed with surgery given recent Echo and Myoview results? 2. Can you please comment on how long she can hold Aspirin for upcoming procedure?  Please route response back to P CV DIV PREOP.  Thank you! Makhari Dovidio

## 2020-11-09 NOTE — Telephone Encounter (Signed)
Patient returning call.

## 2020-11-10 ENCOUNTER — Other Ambulatory Visit: Payer: Self-pay

## 2020-11-10 ENCOUNTER — Other Ambulatory Visit: Payer: Medicare HMO | Admitting: *Deleted

## 2020-11-10 DIAGNOSIS — R0602 Shortness of breath: Secondary | ICD-10-CM

## 2020-11-10 DIAGNOSIS — I2510 Atherosclerotic heart disease of native coronary artery without angina pectoris: Secondary | ICD-10-CM

## 2020-11-10 DIAGNOSIS — I2583 Coronary atherosclerosis due to lipid rich plaque: Secondary | ICD-10-CM | POA: Diagnosis not present

## 2020-11-10 DIAGNOSIS — R072 Precordial pain: Secondary | ICD-10-CM

## 2020-11-10 DIAGNOSIS — E7800 Pure hypercholesterolemia, unspecified: Secondary | ICD-10-CM | POA: Diagnosis not present

## 2020-11-10 DIAGNOSIS — I10 Essential (primary) hypertension: Secondary | ICD-10-CM | POA: Diagnosis not present

## 2020-11-10 LAB — LIPID PANEL
Chol/HDL Ratio: 1.8 ratio (ref 0.0–4.4)
Cholesterol, Total: 125 mg/dL (ref 100–199)
HDL: 68 mg/dL (ref >39)
LDL Chol Calc (NIH): 40 mg/dL (ref 0–99)
Triglycerides: 93 mg/dL (ref 0–149)
VLDL Cholesterol Cal: 17 mg/dL (ref 5–40)

## 2020-11-10 LAB — ALT: ALT: 25 IU/L (ref 0–32)

## 2020-11-11 NOTE — Telephone Encounter (Signed)
Called and left message for patient notifying her of that Dr. Radford Pax said she is OK to proceed with surgery.

## 2020-11-11 NOTE — Telephone Encounter (Signed)
   Primary Cardiologist: Fransico Him, MD  Chart reviewed as part of pre-operative protocol coverage. Patient was last seen by Dr. Radford Pax for a virtual visit in 08/2020 at which time she reported she recently had myalgia, severe fatigue, chest heaviness, cough, and DOE a few weeks prior but symptoms had resolved. It was felt she likely had a viral illness but Echo was ordered for further evaluation. Echo showed LVEF of 60-65% with grade 1 diastolic dysfunction. When she was called with results, she reported she was still having DOE and chest pain so Myoview was ordered and showed no evidence of ischemia. 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 I called and spoke with patient on 11/09/2020. She continued to report chest pain and dyspnea on exertion. Pain occurs after meals but also with activity such as carrying in the groceries.  Discussed with patient with Dr. Radford Pax who feels chest discomfort is likely GI in nature and feels patient is OK to proceed with surgery given recent Echo and Myoview results.  Patient only has mild non-obstructive CAD. Therefore, OK to hold Aspirin as needed prior to surgery from a cardiac standpoint. Would recommend restarting this as soon as possible postoperatively.   I will route this recommendation to the requesting party via Epic fax function and remove from pre-op pool.  Please call with questions.  Darreld Mclean, PA-C 11/11/2020, 8:32 AM

## 2020-11-23 ENCOUNTER — Other Ambulatory Visit: Payer: Self-pay

## 2020-11-23 MED ORDER — EZETIMIBE 10 MG PO TABS: 10.0000 mg | ORAL_TABLET | Freq: Every day | ORAL | 2 refills | Status: AC

## 2020-12-09 ENCOUNTER — Other Ambulatory Visit: Payer: Medicare HMO

## 2020-12-28 DIAGNOSIS — F324 Major depressive disorder, single episode, in partial remission: Secondary | ICD-10-CM | POA: Diagnosis not present

## 2020-12-28 DIAGNOSIS — K219 Gastro-esophageal reflux disease without esophagitis: Secondary | ICD-10-CM | POA: Diagnosis not present

## 2020-12-28 DIAGNOSIS — F4321 Adjustment disorder with depressed mood: Secondary | ICD-10-CM | POA: Diagnosis not present

## 2020-12-28 DIAGNOSIS — E785 Hyperlipidemia, unspecified: Secondary | ICD-10-CM | POA: Diagnosis not present

## 2020-12-28 DIAGNOSIS — I10 Essential (primary) hypertension: Secondary | ICD-10-CM | POA: Diagnosis not present

## 2020-12-28 DIAGNOSIS — E782 Mixed hyperlipidemia: Secondary | ICD-10-CM | POA: Diagnosis not present

## 2020-12-30 NOTE — Progress Notes (Signed)
DUE TO COVID-19 ONLY ONE VISITOR IS ALLOWED TO COME WITH YOU AND STAY IN THE WAITING ROOM ONLY DURING PRE OP AND PROCEDURE DAY OF SURGERY. THE 1 VISITOR  MAY VISIT WITH YOU AFTER SURGERY IN YOUR PRIVATE ROOM DURING VISITING HOURS ONLY!  YOU NEED TO HAVE A COVID 19 TEST ON__2/04/2021 _____ @_______ , THIS TEST MUST BE DONE BEFORE SURGERY,  COVID TESTING SITE [address] [place] [id number], IT IS ON THE RIGHT GOING OUT [address] APPROXIMATELY  2 MINUTES PAST [employer] ON THE RIGHT. ONCE YOUR COVID TEST IS COMPLETED,  PLEASE BEGIN THE QUARANTINE INSTRUCTIONS AS OUTLINED IN YOUR HANDOUT.                Debbie [name]  12/30/2020   Your procedure is scheduled on: 01/13/2021    Report to [facility] Main  Entrance   Report to admitting at      [place] AM     Call this number if you have problems the morning of surgery [phone]    REMEMBER: NO  SOLID FOOD CANDY OR GUM AFTER MIDNIGHT. CLEAR LIQUIDS UNTIL         . NOTHING BY MOUTH EXCEPT CLEAR LIQUIDS UNTIL   0530am    . PLEASE FINISH ENSURE DRINK PER SURGEON ORDER  WHICH NEEDS TO BE COMPLETED AT   0530am    .      CLEAR LIQUID DIET   Foods Allowed                                                                    Coffee and tea, regular and decaf                            Fruit ices (not with fruit pulp)                                      Iced Popsicles                                    Carbonated beverages, regular and diet                                    Cranberry, grape and apple juices Sports drinks like Gatorade Lightly seasoned clear broth or consume(fat free) Sugar, honey syrup ___________________________________________________________________      BRUSH YOUR TEETH MORNING OF SURGERY AND RINSE YOUR MOUTH OUT, NO CHEWING GUM CANDY OR MINTS.     Take these medicines the morning of surgery with A SIP OF WATER:   Zyrtec, pepcid, flonase if needed, protonix   DO NOT TAKE ANY  DIABETIC MEDICATIONS DAY OF YOUR SURGERY                               You may not have any metal on your body including hair pins and  piercings  Do not wear jewelry, make-up, lotions, powders or perfumes, deodorant             Do not wear nail polish on your fingernails.  Do not shave  48 hours prior to surgery.              Men may shave face and neck.   Do not bring valuables to the hospital. [place].  Contacts, dentures or bridgework may not be worn into surgery.  Leave suitcase in the car. After surgery it may be brought to your room.     Patients discharged the day of surgery will not be allowed to drive home. IF YOU ARE HAVING SURGERY AND GOING HOME THE SAME DAY, YOU MUST HAVE AN ADULT TO DRIVE YOU HOME AND BE WITH YOU FOR 24 HOURS. YOU MAY GO HOME BY TAXI OR UBER OR ORTHERWISE, BUT AN ADULT MUST ACCOMPANY YOU HOME AND STAY WITH YOU FOR 24 HOURS.  Name and phone number of your driver:  Special Instructions: N/A              Please read over the following fact sheets you were given: _____________________________________________________________________  [facility] - Preparing for Surgery Before surgery, you can play an important role.  Because skin is not sterile, your skin needs to be as free of germs as possible.  You can reduce the number of germs on your skin by washing with CHG (chlorahexidine gluconate) soap before surgery.  CHG is an antiseptic cleaner which kills germs and bonds with the skin to continue killing germs even after washing. Please DO NOT use if you have an allergy to CHG or antibacterial soaps.  If your skin becomes reddened/irritated stop using the CHG and inform your nurse when you arrive at Short Stay. Do not shave (including legs and underarms) for at least 48 hours prior to the first CHG shower.  You may shave your face/neck. Please follow these instructions carefully:  1.  Shower with CHG Soap  the night before surgery and the  morning of Surgery.  2.  If you choose to wash your hair, wash your hair first as usual with your  normal  shampoo.  3.  After you shampoo, rinse your hair and body thoroughly to remove the  shampoo.                           4.  Use CHG as you would any other liquid soap.  You can apply chg directly  to the skin and wash                       Gently with a scrungie or clean washcloth.  5.  Apply the CHG Soap to your body ONLY FROM THE NECK DOWN.   Do not use on face/ open                           Wound or open sores. Avoid contact with eyes, ears mouth and genitals (private parts).                       Wash face,  Genitals (private parts) with your normal soap.             6.  Wash thoroughly, paying special attention to the area where your surgery  will be performed.  7.  Thoroughly rinse your body with warm water from the neck down.  8.  DO NOT shower/wash with your normal soap after using and rinsing off  the CHG Soap.                9.  Pat yourself dry with a clean towel.            10.  Wear clean pajamas.            11.  Place clean sheets on your bed the night of your first shower and do not  sleep with pets. Day of Surgery : Do not apply any lotions/deodorants the morning of surgery.  Please wear clean clothes to the hospital/surgery center.  FAILURE TO FOLLOW THESE INSTRUCTIONS MAY RESULT IN THE CANCELLATION OF YOUR SURGERY PATIENT SIGNATURE_________________________________  NURSE SIGNATURE__________________________________  ________________________________________________________________________

## 2021-01-04 ENCOUNTER — Encounter (HOSPITAL_COMMUNITY): Payer: Self-pay

## 2021-01-04 ENCOUNTER — Other Ambulatory Visit: Payer: Self-pay

## 2021-01-04 ENCOUNTER — Encounter (HOSPITAL_COMMUNITY)
Admission: RE | Admit: 2021-01-04 | Discharge: 2021-01-04 | Disposition: A | Discharge status: Home / Self Care | Payer: Medicare HMO | Source: Ambulatory Visit | Attending: Surgery | Admitting: Surgery

## 2021-01-04 DIAGNOSIS — I491 Atrial premature depolarization: Secondary | ICD-10-CM | POA: Diagnosis not present

## 2021-01-04 DIAGNOSIS — Z01818 Encounter for other preprocedural examination: Secondary | ICD-10-CM | POA: Insufficient documentation

## 2021-01-04 HISTORY — DX: Anxiety disorder, unspecified: F41.9

## 2021-01-04 HISTORY — DX: Dyspnea, unspecified: R06.00

## 2021-01-04 HISTORY — DX: Personal history of urinary calculi: Z87.442

## 2021-01-04 LAB — CBC
HCT: 40.9 % (ref 36.0–46.0)
Hemoglobin: 13.4 g/dL (ref 12.0–15.0)
MCH: 28.5 pg (ref 26.0–34.0)
MCHC: 32.8 g/dL (ref 30.0–36.0)
MCV: 87 fL (ref 80.0–100.0)
Platelets: 352 10*3/uL (ref 150–400)
RBC: 4.7 MIL/uL (ref 3.87–5.11)
RDW: 13.7 % (ref 11.5–15.5)
WBC: 10.3 10*3/uL (ref 4.0–10.5)
nRBC: 0 % (ref 0.0–0.2)

## 2021-01-04 LAB — BASIC METABOLIC PANEL
Anion gap: 12 (ref 5–15)
BUN: 11 mg/dL (ref 8–23)
CO2: 27 mmol/L (ref 22–32)
Calcium: 9.4 mg/dL (ref 8.9–10.3)
Chloride: 103 mmol/L (ref 98–111)
Creatinine, Ser: 0.78 mg/dL (ref 0.44–1.00)
GFR, Estimated: >60 mL/min (ref >60)
Glucose, Bld: 117 mg/dL — ABNORMAL HIGH (ref 70–99)
Potassium: 3.7 mmol/L (ref 3.5–5.1)
Sodium: 142 mmol/L (ref 135–145)

## 2021-01-04 NOTE — Progress Notes (Addendum)
Anesthesia Review:  PCP: DR Harlan Stains Lov 09/11/2020 Cardiologist : Lasandra Beech Traci Turner 08/18/2020- Telemedicine visit  Cardiology clearance 11/04/2020  Chest x-ray : 09/14/20- Ct chest  EKG : 01/04/2021  Echo : 09/04/2020  Stress test:09/2020  Cardiac Cath :  Activity level: can do a fight of stairs without difficulty  Sleep Study/ CPAP : waiting on cpap machine per pt  Fasting Blood Sugar :      / Checks Blood Sugar -- times a day:   Blood Thinner/ Instructions /Last Dose: ASA / Instructions/ Last Dose :  81 mg Aspirin

## 2021-01-05 NOTE — Anesthesia Preprocedure Evaluation (Addendum)
Anesthesia Evaluation  Patient identified by MRN, date of birth, ID band Patient awake    Reviewed: Allergy & Precautions, NPO status , Patient's Chart, lab work & pertinent test results  Airway Mallampati: II  TM Distance: >3 FB Neck ROM: Full    Dental  (+) Dental Advisory Given   Pulmonary shortness of breath, asthma ,    breath sounds clear to auscultation       Cardiovascular hypertension, Pt. on medications + CAD   Rhythm:Regular Rate:Normal     Neuro/Psych negative neurological ROS     GI/Hepatic Neg liver ROS, hiatal hernia, GERD  Medicated,  Endo/Other  negative endocrine ROS  Renal/GU negative Renal ROS     Musculoskeletal   Abdominal   Peds  Hematology negative hematology ROS (+)   Anesthesia Other Findings   Reproductive/Obstetrics                           Lab Results  Component Value Date   WBC 10.3 01/04/2021   HGB 13.4 01/04/2021   HCT 40.9 01/04/2021   MCV 87.0 01/04/2021   PLT 352 01/04/2021   Lab Results  Component Value Date   CREATININE 0.78 01/04/2021   BUN 11 01/04/2021   NA 142 01/04/2021   K 3.7 01/04/2021   CL 103 01/04/2021   CO2 27 01/04/2021    Anesthesia Physical Anesthesia Plan  ASA: II  Anesthesia Plan: General   Post-op Pain Management:    Induction: Intravenous and Rapid sequence  PONV Risk Score and Plan: 4 or greater and Ondansetron, Dexamethasone, Midazolam, Scopolamine patch - Pre-op and Treatment may vary due to age or medical condition  Airway Management Planned: Oral ETT  Additional Equipment:   Intra-op Plan:   Post-operative Plan: Extubation in OR  Informed Consent: I have reviewed the patients History and Physical, chart, labs and discussed the procedure including the risks, benefits and alternatives for the proposed anesthesia with the patient or authorized representative who has indicated his/her understanding and  acceptance.     Dental advisory given  Plan Discussed with: CRNA  Anesthesia Plan Comments: (Per cardiology note 11/11/2020, "Chart reviewed as part of pre-operative protocol coverage. Patient was last seen by Dr. Radford Pax for a virtual visit in 08/2020 at which time she reported she recently had myalgia, severe fatigue, chest heaviness, cough, and DOE a few weeks prior but symptoms had resolved. It was felt she likely had a viral illness but Echo was ordered for further evaluation. Echo showed LVEF of 60-65% with grade 1 diastolic dysfunction. When she was called with results, she reported she was still having DOE and chest pain so Myoview was ordered and showed no evidence of ischemia. 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 I called and spoke with patient on 11/09/2020. She continued to report chest pain and dyspnea on exertion. Pain occurs after meals but also with activity such as carrying in the groceries.  Discussed with patient with Dr. Radford Pax who feels chest discomfort is likely GI in nature and feels patient is OK to proceed with surgery given recent Echo and Myoview results.  Patient only has mild non-obstructive CAD. Therefore, OK to hold Aspirin as needed prior to surgery from a cardiac standpoint. Would recommend restarting this as soon as possible postoperatively.")      Anesthesia Quick Evaluation

## 2021-01-11 ENCOUNTER — Other Ambulatory Visit (HOSPITAL_COMMUNITY)
Admission: RE | Admit: 2021-01-11 | Discharge: 2021-01-11 | Disposition: A | Discharge status: Home / Self Care | Payer: Medicare HMO | Source: Ambulatory Visit | Attending: Surgery | Admitting: Surgery

## 2021-01-11 DIAGNOSIS — Z01812 Encounter for preprocedural laboratory examination: Secondary | ICD-10-CM | POA: Insufficient documentation

## 2021-01-11 DIAGNOSIS — Z20822 Contact with and (suspected) exposure to covid-19: Secondary | ICD-10-CM | POA: Insufficient documentation

## 2021-01-12 LAB — SARS CORONAVIRUS 2 (TAT 6-24 HRS): SARS Coronavirus 2: NEGATIVE

## 2021-01-13 ENCOUNTER — Other Ambulatory Visit: Payer: Self-pay

## 2021-01-13 ENCOUNTER — Ambulatory Visit (HOSPITAL_COMMUNITY): Payer: Medicare HMO | Admitting: Physician Assistant

## 2021-01-13 ENCOUNTER — Observation Stay (HOSPITAL_COMMUNITY)
Admission: RE | Admit: 2021-01-13 | Discharge: 2021-01-14 | Disposition: A | Discharge status: Home / Self Care | Payer: Medicare HMO | Attending: Surgery | Admitting: Surgery

## 2021-01-13 ENCOUNTER — Ambulatory Visit (HOSPITAL_COMMUNITY): Payer: Medicare HMO | Admitting: Anesthesiology

## 2021-01-13 ENCOUNTER — Encounter (HOSPITAL_COMMUNITY)
Admission: RE | Disposition: A | Discharge status: Home / Self Care | Payer: Self-pay | Source: Home / Self Care | Attending: Surgery

## 2021-01-13 ENCOUNTER — Encounter (HOSPITAL_COMMUNITY): Payer: Self-pay | Admitting: Surgery

## 2021-01-13 DIAGNOSIS — I2510 Atherosclerotic heart disease of native coronary artery without angina pectoris: Secondary | ICD-10-CM | POA: Insufficient documentation

## 2021-01-13 DIAGNOSIS — Z791 Long term (current) use of non-steroidal anti-inflammatories (NSAID): Secondary | ICD-10-CM | POA: Diagnosis not present

## 2021-01-13 DIAGNOSIS — I10 Essential (primary) hypertension: Secondary | ICD-10-CM | POA: Insufficient documentation

## 2021-01-13 DIAGNOSIS — E785 Hyperlipidemia, unspecified: Secondary | ICD-10-CM | POA: Diagnosis not present

## 2021-01-13 DIAGNOSIS — K219 Gastro-esophageal reflux disease without esophagitis: Secondary | ICD-10-CM | POA: Insufficient documentation

## 2021-01-13 DIAGNOSIS — Z79899 Other long term (current) drug therapy: Secondary | ICD-10-CM | POA: Insufficient documentation

## 2021-01-13 DIAGNOSIS — E876 Hypokalemia: Secondary | ICD-10-CM | POA: Diagnosis not present

## 2021-01-13 DIAGNOSIS — E7800 Pure hypercholesterolemia, unspecified: Secondary | ICD-10-CM | POA: Diagnosis not present

## 2021-01-13 DIAGNOSIS — K449 Diaphragmatic hernia without obstruction or gangrene: Principal | ICD-10-CM

## 2021-01-13 DIAGNOSIS — F329 Major depressive disorder, single episode, unspecified: Secondary | ICD-10-CM | POA: Diagnosis not present

## 2021-01-13 DIAGNOSIS — D649 Anemia, unspecified: Secondary | ICD-10-CM | POA: Insufficient documentation

## 2021-01-13 HISTORY — PX: XI ROBOTIC ASSISTED HIATAL HERNIA REPAIR: SHX6889

## 2021-01-13 LAB — CBC
HCT: 38.1 % (ref 36.0–46.0)
Hemoglobin: 12.1 g/dL (ref 12.0–15.0)
MCH: 28.1 pg (ref 26.0–34.0)
MCHC: 31.8 g/dL (ref 30.0–36.0)
MCV: 88.6 fL (ref 80.0–100.0)
Platelets: 315 10*3/uL (ref 150–400)
RBC: 4.3 MIL/uL (ref 3.87–5.11)
RDW: 14 % (ref 11.5–15.5)
WBC: 18.2 10*3/uL — ABNORMAL HIGH (ref 4.0–10.5)
nRBC: 0 % (ref 0.0–0.2)

## 2021-01-13 LAB — CREATININE, SERUM
Creatinine, Ser: 0.78 mg/dL (ref 0.44–1.00)
GFR, Estimated: >60 mL/min (ref >60)

## 2021-01-13 SURGERY — REPAIR, HERNIA, HIATAL, ROBOT-ASSISTED
Anesthesia: General | Site: Abdomen

## 2021-01-13 MED ORDER — ONDANSETRON HCL 4 MG/2ML IJ SOLN
INTRAMUSCULAR | Status: AC
  Filled 2021-01-13: qty 2

## 2021-01-13 MED ORDER — DICYCLOMINE HCL 20 MG PO TABS
20.0000 mg | ORAL_TABLET | Freq: Three times a day (TID) | ORAL | Status: DC
  Administered 2021-01-13 – 2021-01-14 (×3): 20 mg via ORAL
  Filled 2021-01-13 (×3): qty 1

## 2021-01-13 MED ORDER — EZETIMIBE 10 MG PO TABS: 10.0000 mg | ORAL_TABLET | Freq: Every day | ORAL | Status: DC

## 2021-01-13 MED ORDER — LIDOCAINE HCL 2 % IJ SOLN
INTRAMUSCULAR | Status: AC
  Filled 2021-01-13: qty 20

## 2021-01-13 MED ORDER — ONDANSETRON HCL 4 MG/2ML IJ SOLN
INTRAMUSCULAR | Status: DC | PRN
  Administered 2021-01-13: 4 mg via INTRAVENOUS

## 2021-01-13 MED ORDER — SUGAMMADEX SODIUM 200 MG/2ML IV SOLN
INTRAVENOUS | Status: DC | PRN
  Administered 2021-01-13: 150 mg via INTRAVENOUS

## 2021-01-13 MED ORDER — BUPIVACAINE-EPINEPHRINE (PF) 0.25% -1:200000 IJ SOLN
INTRAMUSCULAR | Status: AC
  Filled 2021-01-13: qty 30

## 2021-01-13 MED ORDER — PANTOPRAZOLE SODIUM 40 MG PO TBEC
40.0000 mg | DELAYED_RELEASE_TABLET | Freq: Two times a day (BID) | ORAL | Status: DC
  Administered 2021-01-13: 40 mg via ORAL
  Filled 2021-01-13: qty 1

## 2021-01-13 MED ORDER — HYDROCHLOROTHIAZIDE 12.5 MG PO CAPS
12.5000 mg | ORAL_CAPSULE | Freq: Every day | ORAL | Status: DC
Start: 2021-01-14 — End: 2021-01-14

## 2021-01-13 MED ORDER — FENTANYL CITRATE (PF) 250 MCG/5ML IJ SOLN
INTRAMUSCULAR | Status: AC
  Filled 2021-01-13: qty 5

## 2021-01-13 MED ORDER — ORAL CARE MOUTH RINSE: 15.0000 mL | Freq: Once | OROMUCOSAL | Status: DC

## 2021-01-13 MED ORDER — LORATADINE 10 MG PO TABS
10.0000 mg | ORAL_TABLET | Freq: Every day | ORAL | Status: DC
  Administered 2021-01-13: 10 mg via ORAL
  Filled 2021-01-13: qty 1

## 2021-01-13 MED ORDER — LACTATED RINGERS IV SOLN: INTRAVENOUS | Status: DC

## 2021-01-13 MED ORDER — PHENYLEPHRINE HCL (PRESSORS) 10 MG/ML IV SOLN
INTRAVENOUS | Status: AC
  Filled 2021-01-13: qty 1

## 2021-01-13 MED ORDER — CHLORHEXIDINE GLUCONATE CLOTH 2 % EX PADS: 6.0000 | MEDICATED_PAD | Freq: Once | CUTANEOUS | Status: DC

## 2021-01-13 MED ORDER — CHLORHEXIDINE GLUCONATE 0.12 % MT SOLN: 15.0000 mL | Freq: Once | OROMUCOSAL | Status: DC

## 2021-01-13 MED ORDER — SCOPOLAMINE 1 MG/3DAYS TD PT72
1.0000 | MEDICATED_PATCH | TRANSDERMAL | Status: DC
  Administered 2021-01-13: 1.5 mg via TRANSDERMAL
  Filled 2021-01-13: qty 1

## 2021-01-13 MED ORDER — CEFAZOLIN SODIUM-DEXTROSE 2-4 GM/100ML-% IV SOLN
2.0000 g | INTRAVENOUS | Status: AC
  Administered 2021-01-13: 2 g via INTRAVENOUS
  Filled 2021-01-13: qty 100

## 2021-01-13 MED ORDER — FLUTICASONE PROPIONATE 50 MCG/ACT NA SUSP
2.0000 | Freq: Every day | NASAL | Status: DC | PRN
  Filled 2021-01-13: qty 16

## 2021-01-13 MED ORDER — PHENYLEPHRINE 40 MCG/ML (10ML) SYRINGE FOR IV PUSH (FOR BLOOD PRESSURE SUPPORT)
PREFILLED_SYRINGE | INTRAVENOUS | Status: AC
  Filled 2021-01-13: qty 20

## 2021-01-13 MED ORDER — LIDOCAINE 2% (20 MG/ML) 5 ML SYRINGE
INTRAMUSCULAR | Status: DC | PRN
  Administered 2021-01-13: 50 mg via INTRAVENOUS

## 2021-01-13 MED ORDER — SUCCINYLCHOLINE CHLORIDE 200 MG/10ML IV SOSY
PREFILLED_SYRINGE | INTRAVENOUS | Status: AC
  Filled 2021-01-13: qty 10

## 2021-01-13 MED ORDER — SUCCINYLCHOLINE CHLORIDE 200 MG/10ML IV SOSY
PREFILLED_SYRINGE | INTRAVENOUS | Status: DC | PRN
  Administered 2021-01-13: 100 mg via INTRAVENOUS

## 2021-01-13 MED ORDER — BUPIVACAINE-EPINEPHRINE 0.25% -1:200000 IJ SOLN
INTRAMUSCULAR | Status: DC | PRN
  Administered 2021-01-13: 30 mL

## 2021-01-13 MED ORDER — HYDROMORPHONE HCL 1 MG/ML IJ SOLN: 0.5000 mg | INTRAMUSCULAR | Status: DC | PRN

## 2021-01-13 MED ORDER — LISINOPRIL-HYDROCHLOROTHIAZIDE 10-12.5 MG PO TABS: 1.0000 | ORAL_TABLET | Freq: Every day | ORAL | Status: DC

## 2021-01-13 MED ORDER — DULOXETINE HCL 60 MG PO CPEP
120.0000 mg | ORAL_CAPSULE | Freq: Every day | ORAL | Status: DC
  Administered 2021-01-13: 120 mg via ORAL
  Filled 2021-01-13: qty 2

## 2021-01-13 MED ORDER — ATORVASTATIN CALCIUM 40 MG PO TABS
80.0000 mg | ORAL_TABLET | Freq: Every day | ORAL | Status: DC
  Administered 2021-01-13: 80 mg via ORAL
  Filled 2021-01-13: qty 2

## 2021-01-13 MED ORDER — MIDAZOLAM HCL 2 MG/2ML IJ SOLN
INTRAMUSCULAR | Status: DC | PRN
  Administered 2021-01-13: 2 mg via INTRAVENOUS

## 2021-01-13 MED ORDER — ACETAMINOPHEN 10 MG/ML IV SOLN
INTRAVENOUS | Status: AC
  Administered 2021-01-13: 1000 mg via INTRAVENOUS
  Filled 2021-01-13: qty 100

## 2021-01-13 MED ORDER — EPHEDRINE SULFATE-NACL 50-0.9 MG/10ML-% IV SOSY
PREFILLED_SYRINGE | INTRAVENOUS | Status: DC | PRN
  Administered 2021-01-13: 10 mg via INTRAVENOUS
  Administered 2021-01-13: 5 mg via INTRAVENOUS

## 2021-01-13 MED ORDER — ROCURONIUM BROMIDE 10 MG/ML (PF) SYRINGE
PREFILLED_SYRINGE | INTRAVENOUS | Status: DC | PRN
  Administered 2021-01-13: 30 mg via INTRAVENOUS
  Administered 2021-01-13: 20 mg via INTRAVENOUS
  Administered 2021-01-13: 50 mg via INTRAVENOUS

## 2021-01-13 MED ORDER — ENOXAPARIN SODIUM 40 MG/0.4ML ~~LOC~~ SOLN
40.0000 mg | Freq: Once | SUBCUTANEOUS | Status: AC
  Administered 2021-01-13: 40 mg via SUBCUTANEOUS
  Filled 2021-01-13: qty 0.4

## 2021-01-13 MED ORDER — AMISULPRIDE (ANTIEMETIC) 5 MG/2ML IV SOLN: 10.0000 mg | Freq: Once | INTRAVENOUS | Status: DC | PRN

## 2021-01-13 MED ORDER — MIDAZOLAM HCL 2 MG/2ML IJ SOLN
INTRAMUSCULAR | Status: AC
  Filled 2021-01-13: qty 2

## 2021-01-13 MED ORDER — FENTANYL CITRATE (PF) 100 MCG/2ML IJ SOLN
INTRAMUSCULAR | Status: AC
  Administered 2021-01-13: 25 ug via INTRAVENOUS
  Filled 2021-01-13: qty 2

## 2021-01-13 MED ORDER — ENOXAPARIN SODIUM 40 MG/0.4ML ~~LOC~~ SOLN
40.0000 mg | SUBCUTANEOUS | Status: DC
  Administered 2021-01-14: 40 mg via SUBCUTANEOUS
  Filled 2021-01-13: qty 0.4

## 2021-01-13 MED ORDER — LIDOCAINE HCL (PF) 2 % IJ SOLN
INTRAMUSCULAR | Status: AC
  Filled 2021-01-13: qty 5

## 2021-01-13 MED ORDER — ACETAMINOPHEN 325 MG PO TABS
650.0000 mg | ORAL_TABLET | Freq: Four times a day (QID) | ORAL | Status: DC
  Administered 2021-01-13 – 2021-01-14 (×2): 650 mg via ORAL
  Filled 2021-01-13 (×3): qty 2

## 2021-01-13 MED ORDER — FENTANYL CITRATE (PF) 100 MCG/2ML IJ SOLN
INTRAMUSCULAR | Status: DC | PRN
  Administered 2021-01-13: 50 ug via INTRAVENOUS

## 2021-01-13 MED ORDER — LIDOCAINE 2% (20 MG/ML) 5 ML SYRINGE
INTRAMUSCULAR | Status: DC | PRN
  Administered 2021-01-13: 1.5 mg/kg/h via INTRAVENOUS

## 2021-01-13 MED ORDER — DEXAMETHASONE SODIUM PHOSPHATE 10 MG/ML IJ SOLN
INTRAMUSCULAR | Status: AC
  Filled 2021-01-13: qty 1

## 2021-01-13 MED ORDER — ASPIRIN EC 81 MG PO TBEC: 81.0000 mg | DELAYED_RELEASE_TABLET | Freq: Every day | ORAL | Status: DC

## 2021-01-13 MED ORDER — OXYCODONE HCL 5 MG PO TABS: 10.0000 mg | ORAL_TABLET | ORAL | Status: DC | PRN

## 2021-01-13 MED ORDER — GABAPENTIN 100 MG PO CAPS
200.0000 mg | ORAL_CAPSULE | Freq: Three times a day (TID) | ORAL | Status: DC
  Administered 2021-01-13 – 2021-01-14 (×3): 200 mg via ORAL
  Filled 2021-01-13 (×3): qty 2

## 2021-01-13 MED ORDER — OXYCODONE HCL 5 MG PO TABS
5.0000 mg | ORAL_TABLET | ORAL | Status: DC | PRN
  Administered 2021-01-13 – 2021-01-14 (×2): 5 mg via ORAL
  Filled 2021-01-13 (×2): qty 1

## 2021-01-13 MED ORDER — PROPOFOL 10 MG/ML IV BOLUS
INTRAVENOUS | Status: DC | PRN
  Administered 2021-01-13: 150 mg via INTRAVENOUS

## 2021-01-13 MED ORDER — PROPOFOL 10 MG/ML IV BOLUS
INTRAVENOUS | Status: AC
  Filled 2021-01-13: qty 20

## 2021-01-13 MED ORDER — FENTANYL CITRATE (PF) 100 MCG/2ML IJ SOLN: 25.0000 ug | INTRAMUSCULAR | Status: DC | PRN

## 2021-01-13 MED ORDER — PHENYLEPHRINE 40 MCG/ML (10ML) SYRINGE FOR IV PUSH (FOR BLOOD PRESSURE SUPPORT)
PREFILLED_SYRINGE | INTRAVENOUS | Status: DC | PRN
  Administered 2021-01-13: 40 ug via INTRAVENOUS
  Administered 2021-01-13 (×6): 120 ug via INTRAVENOUS

## 2021-01-13 MED ORDER — LACTATED RINGERS IR SOLN
Status: DC | PRN
  Administered 2021-01-13: 1000 mL

## 2021-01-13 MED ORDER — PHENYLEPHRINE HCL-NACL 10-0.9 MG/250ML-% IV SOLN
INTRAVENOUS | Status: DC | PRN
  Administered 2021-01-13: 25 ug/min via INTRAVENOUS

## 2021-01-13 MED ORDER — DEXAMETHASONE SODIUM PHOSPHATE 10 MG/ML IJ SOLN
INTRAMUSCULAR | Status: DC | PRN
  Administered 2021-01-13: 10 mg via INTRAVENOUS

## 2021-01-13 MED ORDER — KETAMINE HCL 10 MG/ML IJ SOLN
INTRAMUSCULAR | Status: DC | PRN
  Administered 2021-01-13 (×2): 10 mg via INTRAVENOUS

## 2021-01-13 MED ORDER — KETOROLAC TROMETHAMINE 15 MG/ML IJ SOLN
15.0000 mg | INTRAMUSCULAR | Status: AC
  Administered 2021-01-13: 15 mg via INTRAVENOUS
  Filled 2021-01-13: qty 1

## 2021-01-13 MED ORDER — EPHEDRINE 5 MG/ML INJ
INTRAVENOUS | Status: AC
  Filled 2021-01-13: qty 10

## 2021-01-13 MED ORDER — FAMOTIDINE 20 MG PO TABS
20.0000 mg | ORAL_TABLET | Freq: Two times a day (BID) | ORAL | Status: DC
  Administered 2021-01-13: 20 mg via ORAL
  Filled 2021-01-13: qty 1

## 2021-01-13 MED ORDER — LISINOPRIL 10 MG PO TABS: 10.0000 mg | ORAL_TABLET | Freq: Every day | ORAL | Status: DC

## 2021-01-13 MED ORDER — ACETAMINOPHEN 10 MG/ML IV SOLN: 1000.0000 mg | Freq: Once | INTRAVENOUS | Status: AC

## 2021-01-13 MED ORDER — 0.9 % SODIUM CHLORIDE (POUR BTL) OPTIME
TOPICAL | Status: DC | PRN
  Administered 2021-01-13: 1000 mL

## 2021-01-13 MED ORDER — KETAMINE HCL 10 MG/ML IJ SOLN
INTRAMUSCULAR | Status: AC
  Filled 2021-01-13: qty 1

## 2021-01-13 SURGICAL SUPPLY — 54 items
APPLIER CLIP 5 13 M/L LIGAMAX5 (MISCELLANEOUS)
APPLIER CLIP ROT 10 11.4 M/L (STAPLE)
BLADE SURG SZ11 CARB STEEL (BLADE) ×2 IMPLANT
CHLORAPREP W/TINT 26 (MISCELLANEOUS) ×2 IMPLANT
CLIP APPLIE 5 13 M/L LIGAMAX5 (MISCELLANEOUS) IMPLANT
CLIP APPLIE ROT 10 11.4 M/L (STAPLE) IMPLANT
COVER SURGICAL LIGHT HANDLE (MISCELLANEOUS) ×2 IMPLANT
COVER TIP SHEARS 8 DVNC (MISCELLANEOUS) ×1 IMPLANT
COVER TIP SHEARS 8MM DA VINCI (MISCELLANEOUS) ×2
COVER WAND RF STERILE (DRAPES) IMPLANT
DECANTER SPIKE VIAL GLASS SM (MISCELLANEOUS) ×2 IMPLANT
DERMABOND ADVANCED (GAUZE/BANDAGES/DRESSINGS) ×1
DERMABOND ADVANCED .7 DNX12 (GAUZE/BANDAGES/DRESSINGS) ×1 IMPLANT
DRAIN PENROSE 0.5X18 (DRAIN) ×2 IMPLANT
DRAPE ARM DVNC X/XI (DISPOSABLE) ×4 IMPLANT
DRAPE COLUMN DVNC XI (DISPOSABLE) ×1 IMPLANT
DRAPE DA VINCI XI ARM (DISPOSABLE) ×8
DRAPE DA VINCI XI COLUMN (DISPOSABLE) ×2
ELECT REM PT RETURN 15FT ADLT (MISCELLANEOUS) ×2 IMPLANT
ENDOLOOP SUT PDS II  0 18 (SUTURE)
ENDOLOOP SUT PDS II 0 18 (SUTURE) IMPLANT
GAUZE 4X4 16PLY RFD (DISPOSABLE) ×2 IMPLANT
GOWN STRL REUS W/TWL LRG LVL3 (GOWN DISPOSABLE) ×6 IMPLANT
GOWN STRL REUS W/TWL XL LVL3 (GOWN DISPOSABLE) IMPLANT
KIT BASIN OR (CUSTOM PROCEDURE TRAY) ×2 IMPLANT
LUBRICANT JELLY K Y 4OZ (MISCELLANEOUS) IMPLANT
MARKER SKIN DUAL TIP RULER LAB (MISCELLANEOUS) ×2 IMPLANT
NEEDLE HYPO 22GX1.5 SAFETY (NEEDLE) ×2 IMPLANT
NEEDLE INSUFFLATION 14GA 120MM (NEEDLE) IMPLANT
PACK CARDIOVASCULAR III (CUSTOM PROCEDURE TRAY) ×2 IMPLANT
PAD POSITIONING PINK XL (MISCELLANEOUS) ×2 IMPLANT
SCISSORS LAP 5X35 DISP (ENDOMECHANICALS) IMPLANT
SEAL CANN UNIV 5-8 DVNC XI (MISCELLANEOUS) ×4 IMPLANT
SEAL XI 5MM-8MM UNIVERSAL (MISCELLANEOUS) ×8
SEALER SYNCHRO 8 IS4000 DV (MISCELLANEOUS) ×2
SEALER SYNCHRO 8 IS4000 DVNC (MISCELLANEOUS) ×1 IMPLANT
SET IRRIG TUBING LAPAROSCOPIC (IRRIGATION / IRRIGATOR) ×2 IMPLANT
SOL ANTI FOG 6CC (MISCELLANEOUS) ×1 IMPLANT
SOLUTION ANTI FOG 6CC (MISCELLANEOUS) ×1
SOLUTION ELECTROLUBE (MISCELLANEOUS) ×2 IMPLANT
SUT ETHIBOND 0 36 GRN (SUTURE) ×4 IMPLANT
SUT MNCRL AB 4-0 PS2 18 (SUTURE) ×2 IMPLANT
SUT SILK 0 SH 30 (SUTURE) IMPLANT
SUT SILK 2 0 SH (SUTURE) IMPLANT
SYR 20ML LL LF (SYRINGE) ×2 IMPLANT
TIP INNERVISION DETACH 40FR (MISCELLANEOUS) IMPLANT
TIP INNERVISION DETACH 50FR (MISCELLANEOUS) IMPLANT
TIP INNERVISION DETACH 56FR (MISCELLANEOUS) IMPLANT
TIPS INNERVISION DETACH 40FR (MISCELLANEOUS)
TOWEL OR 17X26 10 PK STRL BLUE (TOWEL DISPOSABLE) ×2 IMPLANT
TOWEL OR NON WOVEN STRL DISP B (DISPOSABLE) ×2 IMPLANT
TRAY FOLEY MTR SLVR 16FR STAT (SET/KITS/TRAYS/PACK) IMPLANT
TROCAR ADV FIXATION 5X100MM (TROCAR) ×2 IMPLANT
TUBING INSUFFLATION 10FT LAP (TUBING) ×2 IMPLANT

## 2021-01-13 NOTE — Transfer of Care (Signed)
Immediate Anesthesia Transfer of Care Note  Patient: Debbie [name]  Procedure(s) Performed: ROBOTIC ASSISTED NISSEN FUNDOPLICATION (N/A Abdomen)  Patient Location: PACU  Anesthesia Type:General  Level of Consciousness: awake, alert  and oriented  Airway & Oxygen Therapy: Patient Spontanous Breathing and Patient connected to face mask oxygen  Post-op Assessment: Report given to RN, Post -op Vital signs reviewed and stable and Patient moving all extremities X 4  Post vital signs: Reviewed and stable  Last Vitals:  Vitals Value Taken Time  BP    Temp    Pulse 77 01/13/21 1122  Resp 18 01/13/21 1122  SpO2 100 % 01/13/21 1122  Vitals shown include unvalidated device data.  Last Pain:  Vitals:   01/13/21 0646  TempSrc: Oral         Complications: No complications documented.

## 2021-01-13 NOTE — H&P (Signed)
Admitting Physician: Nickola Major Doniel Maiello  Service: General Surgery  CC: Elective hiatal hernia repair  Subjective   HPI: Debbie [name] is an 70 y.o. female who is here for elective hiatal hernia repair  Past Medical History:  Diagnosis Date  . Acid reflux   . ALLERGIC RHINITIS   . Anxiety   . CAD (coronary artery disease), native coronary artery    coronary CTA 11/2019 with a calcium score of 146 and mild nonobstructive plaque in the mid LAD.  Marland Kitchen Depression   . Diverticulosis   . Dyspnea    with exertion   . Esophageal stricture   . Hiatal hernia    with  Schatzki's Ring  . History of kidney stones   . Hyperlipidemia   . Hypertension   . IBS (irritable bowel syndrome)     Past Surgical History:  Procedure Laterality Date  . ABDOMINAL HYSTERECTOMY    . ANKLE FRACTURE SURGERY  2012   right  . COLONOSCOPY  multiple  . ESOPHAGOGASTRODUODENOSCOPY  09/14/05   multiple  . KIDNEY STONE SURGERY    . TONSILLECTOMY    . TUBAL LIGATION    . VESICOVAGINAL FISTULA CLOSURE W/ TAH      Family History  Problem Relation Age of Onset  . Allergies Mother   . Heart disease Mother        bypass surgery  . Hypertension Mother   . Hyperlipidemia Mother   . Heart disease Father        bypass surgery  . Hypertension Father   . Hyperlipidemia Father   . Stroke Father   . Allergies Daughter   . Allergies Son   . Diabetes Paternal Grandmother   . Colon cancer Neg Hx   . Esophageal cancer Neg Hx   . Rectal cancer Neg Hx   . Stomach cancer Neg Hx     Social:  reports that she has never smoked. She has never used smokeless tobacco. She reports that she does not drink alcohol and does not use drugs.  Allergies: No Known Allergies  Medications: Current Outpatient Medications  Medication Instructions  . aspirin EC 81 mg, Oral, Daily  . atorvastatin (LIPITOR) 80 mg, Oral, Daily  . cetirizine (ZYRTEC) 5 mg, Oral, Daily  . Cymbalta 120 mg, Oral, Daily at bedtime  .  dicyclomine (BENTYL) 20 mg, Oral, 4 times daily  . ezetimibe (ZETIA) 10 mg, Oral, Daily  . famotidine (PEPCID) 20 mg, Oral, 2 times daily  . fluticasone (FLONASE) 50 MCG/ACT nasal spray 2 sprays, Each Nare, Daily PRN  . ibuprofen (ADVIL) 600 mg, Oral, Every 8 hours PRN  . lisinopril-hydrochlorothiazide (PRINZIDE,ZESTORETIC) 20-12.5 MG tablet 1 tablet, Oral, Daily  . lisinopril-hydrochlorothiazide (ZESTORETIC) 10-12.5 MG tablet 1 tablet, Oral, Daily  . Magnesium 500 mg, Oral, Daily  . Multiple Vitamin (MULTIVITAMIN) capsule 1 capsule, Every morning  . pantoprazole (PROTONIX) 40 mg, Oral, 2 times daily  . potassium chloride SA (KLOR-CON) 20 MEQ tablet 40 mEq, Oral, 2 times daily  . Vitamin D-3 1,000 Units, Oral, Daily    ROS - all of the below systems have been reviewed with the patient and positives are indicated with bold text General: chills, fever or night sweats Eyes: blurry vision or double vision ENT: epistaxis or sore throat Allergy/Immunology: itchy/watery eyes or nasal congestion Hematologic/Lymphatic: bleeding problems, blood clots or swollen lymph nodes Endocrine: temperature intolerance or unexpected weight changes Breast: new or changing breast lumps or nipple discharge Resp: cough, shortness of breath, or  wheezing CV: chest pain or dyspnea on exertion GI: as per HPI GU: dysuria, trouble voiding, or hematuria MSK: joint pain or joint stiffness Neuro: TIA or stroke symptoms Derm: pruritus and skin lesion changes Psych: anxiety and depression  Objective   PE Blood pressure (!) 152/66, pulse 95, temperature 97.8 F (36.6 C), temperature source Oral, resp. rate 18, SpO2 96 %. Constitutional: NAD; conversant; no deformities Eyes: Moist conjunctiva; no lid lag; anicteric; PERRL Neck: Trachea midline; no thyromegaly Lungs: Normal respiratory effort; no tactile fremitus CV: RRR; no palpable thrills; no pitting edema GI: Abd Soft, non-tender, non-distended; no palpable  hepatosplenomegaly MSK: Normal range of motion of extremities; no clubbing/cyanosis Psychiatric: Appropriate affect; alert and oriented x3 Lymphatic: No palpable cervical or axillary lymphadenopathy  No results found for this or any previous visit (from the past 24 hour(s)).  Imaging Orders  No imaging studies ordered today     Assessment and Plan   Debbie [name] is an 70 y.o. female with hiatal hernia and primary complaint of dysphagia.  I recommended robotic hiatal hernia repair.  The procedure itself as well as its risk, benefits, and alternatives were discussed the patient who granted consent to proceed.  We will proceed to the operating room as scheduled.  Felicie Morn, MD  [facility] Use [email or web address] to contact on call provider

## 2021-01-13 NOTE — Plan of Care (Signed)
Upon arrival from PACU. Pt demonstrated correct use of IS. Pt sipping on cold water and resting without complaint.

## 2021-01-13 NOTE — Op Note (Signed)
Patient: Debbie [name] (1951-02-27, [id number])  Date of Surgery: 01/13/2021   Preoperative Diagnosis: PARAESOPHAGEAL HERNIA   Postoperative Diagnosis: PARAESOPHAGEAL HERNIA   Surgical Procedure: ROBOTIC ASSISTED HIATAL HERNIA REPAIR WITH TOUPET FUNDOPLICATION:    Operative Team Members:  Surgeon(s) and Role:    * Samaya Boardley, Nickola Major, MD - Primary    * Leighton Ruff, MD - Assisting   Anesthesiologist: Suzette Battiest, MD CRNA: Milford Cage, CRNA; Niel Hummer, CRNA   Anesthesia: General   Fluids:  Total I/O In: 1100 [I.V.:1000; IV Piggyback:100] Out: 15 [WUJWJ:19]  Complications: None  Drains:  none   Specimen: None  Disposition:  PACU - hemodynamically stable.  Plan of Care: Admit for overnight observation  Indications for Procedure: Kynslie Ringle is a 71 y.o. female who presented with dysphagia, found to have a paraesophageal hernia.  Preoperative esophagram demonstrated a large hiatal hernia with some gastroesophageal reflux and esophageal dysmotility.  She was significantly symptomatic with symptoms concerning for dysphagia as well as reflux, though dysphagia appeared to be her primary complaint.  Due to her dysphagia and esophageal dysmotility, I recommended a robotic hiatal hernia repair with partial fundoplication.  The procedure itself as well as its risk, benefits, and alternatives were discussed the patient.  The risk discussed included but were not limited to the risk of infection, bleeding, damage nearby structures, esophageal or gastric injury, recurrent hiatal hernia, and atrial fibrillation.  After full discussion all questions answered the patient was scheduled for surgery and consented to proceed with surgery.  Findings: Large hiatal hernia involving the majority of the fundus of the stomach.  Description of Procedure:   The patient was positioned supine, padded and secured to the bed.  The abdomen was widely prepped and draped.  A  time out procedure was performed.   The abdomen was entered in the left periumbilical region utilizing a 12 mm optical viewing trocar, tunneling through the rectus muscle superiorly.  Upon safe entry into the abdomen, it was insufflated completely.  Under direct visualization, four 8 mm robotic trocars were placed in the right and left subcostal regions.  A Nathanson liver retractor was placed at the epigastrium and utilized to retract the left lobe of the liver anteriorly.  It was held in position with a laparoscopic holding device clamped to the side of the bed.  The robot was docked in the standard fashion and the operation begun from the robotic console.  The fenestrated bipolar grasper was utilized in position 4, prograsp in position 3, and the synchroseal in position 1.    Beginning at the twelve o'clock position on the crus, the hernia sac was grasped and reduced form the mediastinum.  The hernia sac was divided to enter the plane between the sac and the mediastinum.  The hernia sac was divided towards the left and right with continued traction on the hernia sac to reduce it.  A mediastinal dissection was performed to further reduce the hernia sac which facilitated reduction of the stomach as well.  The hernia sac was disconnected from the right and left crura and subsequently, the hernia sac was dissected from the stomach and esophagus, and excised.  The short gastric vessels were divided from the inferior pole of the spleen, superiorly, to completely disconnect the stomach from the spleen and the left hemidiaphragm.  All posterior gastric attachments to the lesser sac and retroperitoneum were divided.  The left crus was further delineated.  A retroesophageal window was created and a  1/2" Penrose drain was used to encircle the esophagus for retraction.    A high, circumferential mediastinal dissection was performed in an effort to mobilize the esophagus and provide for adequate intraabdominal  esophageal length.  The mediastinal dissection was performed bluntly, with little to no thermal energy.  The anterior and posterior vagus nerves were both preserved.  The right pleura was entered during this dissection, and communication with anesthesia there appeared to be little effect on her pulmonary physiology from this capnothorax.  The crural defect was reapproximated with two, interrupted, 0 Ethibond sutures.  The crural pillars came together well without tearing of the adjacent diaphragmatic tissue.   A Toupet fundoplication was performed.  A 50 French bougie was introduced into the stomach.  The fundus of the stomach was passed behind the lower esophagus.  A shoeshine maneuver was performed to ensure the fundus was not twisted and way comfortably in position to form the fundoplication. Three 0-ethibond sutures were placed to fix the greater curve of the stomach to the right anterior aspect of the esophagus in standard fashion.  3 additional 0 Ethibond sutures were placed to fix the greater curve the stomach to the left anterior aspect of the esophagus in standard fashion.  This effectively formed an approximately 270 degree wrap around the lower esophagus to reapproximate the lower esophageal sphincter function.  The operative field was inspected for hemostasis.  The Penrose drain was removed.  The robot was undocked and the laparoscope was inserted into the peritoneal cavity to visualize the removal of the liver retractor.  The 12 mm trocar in the lower abdomen did not require fascial closure since it was angled through the abdominal wall.  The peritoneal cavity was desufflated with the suction irrigator inserted into the mediastinum.  As we desufflated the abdomen suction was applied in the mediastinum and a few Valsalva maneuvers were performed by anesthesia in order to evacuate capnothorax.  Then the trocars removed.  The incisions were closed with 4-0 Monocryl subcuticular sutures and skin glue.   All sponge needle counts were correct at the end this case.     Louanna Raw, MD General, Bariatric, & Minimally Invasive Surgery [facility], [place]

## 2021-01-13 NOTE — Anesthesia Postprocedure Evaluation (Signed)
Anesthesia Post Note  Patient: Debbie [name]  Procedure(s) Performed: ROBOTIC ASSISTED NISSEN FUNDOPLICATION (N/A Abdomen)     Patient location during evaluation: PACU Anesthesia Type: General Level of consciousness: awake and alert Pain management: pain level controlled Vital Signs Assessment: post-procedure vital signs reviewed and stable Respiratory status: spontaneous breathing, nonlabored ventilation, respiratory function stable and patient connected to nasal cannula oxygen Cardiovascular status: blood pressure returned to baseline and stable Postop Assessment: no apparent nausea or vomiting Anesthetic complications: no   No complications documented.  Last Vitals:  Vitals:   01/13/21 1215 01/13/21 1245  BP: (!) 149/91 (!) 154/89  Pulse: 80 80  Resp: 17 16  Temp: 36.9 C 37 C  SpO2: 97% 95%    Last Pain:  Vitals:   01/13/21 1245  TempSrc: Oral  PainSc:                  Debbie [name]

## 2021-01-13 NOTE — Anesthesia Procedure Notes (Signed)
Procedure Name: Intubation Date/Time: 01/13/2021 8:44 AM Performed by: Niel Hummer, CRNA Pre-anesthesia Checklist: Patient identified, Emergency Drugs available, Suction available and Patient being monitored Patient Re-evaluated:Patient Re-evaluated prior to induction Oxygen Delivery Method: Circle system utilized Preoxygenation: Pre-oxygenation with 100% oxygen Induction Type: IV induction and Rapid sequence Laryngoscope Size: Mac and 4 Grade View: Grade II Tube type: Oral Tube size: 7.0 mm Number of attempts: 1 Airway Equipment and Method: Bougie stylet Placement Confirmation: ETT inserted through vocal cords under direct vision,  positive ETCO2 and breath sounds checked- equal and bilateral Secured at: 20 cm Tube secured with: Tape Dental Injury: Teeth and Oropharynx as per pre-operative assessment

## 2021-01-14 ENCOUNTER — Encounter (HOSPITAL_COMMUNITY): Payer: Self-pay | Admitting: Surgery

## 2021-01-14 DIAGNOSIS — F329 Major depressive disorder, single episode, unspecified: Secondary | ICD-10-CM | POA: Diagnosis not present

## 2021-01-14 DIAGNOSIS — I10 Essential (primary) hypertension: Secondary | ICD-10-CM | POA: Diagnosis not present

## 2021-01-14 DIAGNOSIS — Z79899 Other long term (current) drug therapy: Secondary | ICD-10-CM | POA: Diagnosis not present

## 2021-01-14 DIAGNOSIS — I2510 Atherosclerotic heart disease of native coronary artery without angina pectoris: Secondary | ICD-10-CM | POA: Diagnosis not present

## 2021-01-14 DIAGNOSIS — E785 Hyperlipidemia, unspecified: Secondary | ICD-10-CM | POA: Diagnosis not present

## 2021-01-14 DIAGNOSIS — D649 Anemia, unspecified: Secondary | ICD-10-CM | POA: Diagnosis not present

## 2021-01-14 DIAGNOSIS — K449 Diaphragmatic hernia without obstruction or gangrene: Secondary | ICD-10-CM | POA: Diagnosis not present

## 2021-01-14 DIAGNOSIS — Z791 Long term (current) use of non-steroidal anti-inflammatories (NSAID): Secondary | ICD-10-CM | POA: Diagnosis not present

## 2021-01-14 DIAGNOSIS — K219 Gastro-esophageal reflux disease without esophagitis: Secondary | ICD-10-CM | POA: Diagnosis not present

## 2021-01-14 LAB — CBC
HCT: 33.9 % — ABNORMAL LOW (ref 36.0–46.0)
Hemoglobin: 11 g/dL — ABNORMAL LOW (ref 12.0–15.0)
MCH: 28.2 pg (ref 26.0–34.0)
MCHC: 32.4 g/dL (ref 30.0–36.0)
MCV: 86.9 fL (ref 80.0–100.0)
Platelets: 286 10*3/uL (ref 150–400)
RBC: 3.9 MIL/uL (ref 3.87–5.11)
RDW: 14 % (ref 11.5–15.5)
WBC: 13.5 10*3/uL — ABNORMAL HIGH (ref 4.0–10.5)
nRBC: 0 % (ref 0.0–0.2)

## 2021-01-14 LAB — BASIC METABOLIC PANEL
Anion gap: 11 (ref 5–15)
BUN: 12 mg/dL (ref 8–23)
CO2: 26 mmol/L (ref 22–32)
Calcium: 8.6 mg/dL — ABNORMAL LOW (ref 8.9–10.3)
Chloride: 103 mmol/L (ref 98–111)
Creatinine, Ser: 0.69 mg/dL (ref 0.44–1.00)
GFR, Estimated: >60 mL/min (ref >60)
Glucose, Bld: 128 mg/dL — ABNORMAL HIGH (ref 70–99)
Potassium: 3.9 mmol/L (ref 3.5–5.1)
Sodium: 140 mmol/L (ref 135–145)

## 2021-01-14 LAB — HIV ANTIBODY (ROUTINE TESTING W REFLEX): HIV Screen 4th Generation wRfx: NONREACTIVE

## 2021-01-14 MED ORDER — OXYCODONE-ACETAMINOPHEN 5-325 MG PO TABS: 1.0000 | ORAL_TABLET | ORAL | 0 refills | Status: AC | PRN

## 2021-01-14 NOTE — Discharge Instructions (Signed)
Diet - Stay hydrated! Drink at least 64 ounces of fluid per day - Be sure to take 80 grams of protein per day - Stick to a liquid diet for two weeks after discharge from the hospital.  Eat soft foods the next week.  Slowly advance to a regular diet. - Avoid foods with high amounts of sugar and starch.

## 2021-01-14 NOTE — Discharge Summary (Signed)
Patient ID: Debbie [name] [id number] 70 y.o. Apr 10, 1951  01/13/2021  Discharge date and time: 01/14/2021  Admitting Physician: [place]  Discharge Physician: [place]  Admission Diagnoses: Paraesophageal hernia [K44.9] Patient Active Problem List   Diagnosis Date Noted  . 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 CAD (coronary artery disease), native coronary artery   . Chest pain 02/09/2018  . SOB (shortness of breath) 02/09/2018  . Anemia 04/23/2017  . Hypokalemia 04/23/2017  . Orthostatic hypotension 04/23/2017  . Diverticulitis large intestine w/o perforation or abscess w/bleeding 04/22/2017  . Hypertension 08/30/2012  . Hypercholesteremia 08/30/2012  . Seasonal and perennial allergic rhinitis 08/18/2012  . Acute recurrent maxillary sinusitis 08/18/2012  . Asthma with bronchitis 08/18/2012     Discharge Diagnoses: Paraesophageal hernia Patient Active Problem List   Diagnosis Date Noted  . 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 CAD (coronary artery disease), native coronary artery   . Chest pain 02/09/2018  . SOB (shortness of breath) 02/09/2018  . Anemia 04/23/2017  . Hypokalemia 04/23/2017  . Orthostatic hypotension 04/23/2017  . Diverticulitis large intestine w/o perforation or abscess w/bleeding 04/22/2017  . Hypertension 08/30/2012  . Hypercholesteremia 08/30/2012  . Seasonal and perennial allergic rhinitis 08/18/2012  . Acute recurrent maxillary sinusitis 08/18/2012  . Asthma with bronchitis 08/18/2012    Operations: Procedure(s): ROBOTIC ASSISTED HIATAL HERNIA REPAIR AND TOUPET FUNDOPLICATION 08/07/4267  Admission Condition: good  Discharged Condition: good  Indication for Admission: Ms. Lips presented with dysphagia and reflux and a paraesophageal hernia.  She was admitted to the hospital for elective hernia repair  Hospital Course: She underwent elective surgery and was discharged the following day.  Consults:  None  Significant Diagnostic Studies: n/a  Treatments: surgery: As above  Disposition: Home  Patient Instructions:  Allergies as of 01/14/2021   No Known Allergies     Medication List    TAKE these medications   aspirin EC 81 MG tablet Take 1 tablet (81 mg total) by mouth daily.   atorvastatin 80 MG tablet Commonly known as: LIPITOR Take 80 mg by mouth daily.   cetirizine 5 MG tablet Commonly known as: ZYRTEC Take 5 mg by mouth daily.   Cymbalta 60 MG capsule Generic drug: DULoxetine Take 120 mg by mouth at bedtime.   dicyclomine 20 MG tablet Commonly known as: BENTYL Take 20 mg by mouth in the morning, at noon, in the evening, and at bedtime.   ezetimibe 10 MG tablet Commonly known as: ZETIA Take 1 tablet (10 mg total) by mouth daily.   famotidine 20 MG tablet Commonly known as: PEPCID Take 20 mg by mouth 2 (two) times daily.   fluticasone 50 MCG/ACT nasal spray Commonly known as: FLONASE Place 2 sprays into both nostrils daily as needed for allergies or rhinitis.   ibuprofen 600 MG tablet Commonly known as: ADVIL Take 1 tablet (600 mg total) by mouth every 8 (eight) hours as needed. What changed: reasons to take this   lisinopril-hydrochlorothiazide 20-12.5 MG tablet Commonly known as: ZESTORETIC Take 1 tablet by mouth daily.   lisinopril-hydrochlorothiazide 10-12.5 MG tablet Commonly known as: ZESTORETIC Take 1 tablet by mouth daily at 12 noon.   Magnesium 500 MG Tabs Take 500 mg by mouth daily.   multivitamin capsule Take 1 capsule by mouth every morning.   oxyCODONE-acetaminophen 5-325 MG tablet Commonly known as: Percocet Take 1 tablet by mouth every 4 (four) hours as needed for severe pain.   pantoprazole 40 MG tablet Commonly known as: PROTONIX Take 40  mg by mouth 2 (two) times daily.   potassium chloride SA 20 MEQ tablet Commonly known as: KLOR-CON Take 2 tablets (40 mEq total) by mouth 2 (two) times daily.   Vitamin D-3 25 MCG  (1000 UT) Caps Take 1,000 Units by mouth daily.       Activity: no heavy lifting for 4 weeks Diet: Full liquid diet for two weeks then slowly advance to regular diet Wound Care: keep wound clean and dry. Do not submerge incisions underwater  Follow-up:  With [facility] in 3 week.  Signed: [place], Bariatric, & Minimally Invasive Surgery [facility], [place]   01/14/2021, 8:08 AM

## 2021-01-14 NOTE — Plan of Care (Signed)
  Problem: Education: Goal: Required Educational Video(s) Outcome: Adequate for Discharge   Problem: Clinical Measurements: Goal: Postoperative complications will be avoided or minimized Outcome: Adequate for Discharge   Problem: Skin Integrity: Goal: Demonstration of wound healing without infection will improve Outcome: Adequate for Discharge   

## 2021-02-05 DIAGNOSIS — H9202 Otalgia, left ear: Secondary | ICD-10-CM | POA: Diagnosis not present

## 2021-02-26 DIAGNOSIS — K219 Gastro-esophageal reflux disease without esophagitis: Secondary | ICD-10-CM | POA: Diagnosis not present

## 2021-02-26 DIAGNOSIS — F4321 Adjustment disorder with depressed mood: Secondary | ICD-10-CM | POA: Diagnosis not present

## 2021-02-26 DIAGNOSIS — F324 Major depressive disorder, single episode, in partial remission: Secondary | ICD-10-CM | POA: Diagnosis not present

## 2021-02-26 DIAGNOSIS — I10 Essential (primary) hypertension: Secondary | ICD-10-CM | POA: Diagnosis not present

## 2021-02-26 DIAGNOSIS — E782 Mixed hyperlipidemia: Secondary | ICD-10-CM | POA: Diagnosis not present

## 2021-03-17 ENCOUNTER — Encounter (HOSPITAL_COMMUNITY): Payer: Self-pay

## 2021-03-17 ENCOUNTER — Ambulatory Visit (HOSPITAL_COMMUNITY)
Admission: EM | Admit: 2021-03-17 | Discharge: 2021-03-17 | Disposition: A | Discharge status: Home / Self Care | Payer: Medicare HMO | Attending: Emergency Medicine | Admitting: Emergency Medicine

## 2021-03-17 ENCOUNTER — Other Ambulatory Visit: Payer: Self-pay

## 2021-03-17 DIAGNOSIS — H60501 Unspecified acute noninfective otitis externa, right ear: Secondary | ICD-10-CM | POA: Diagnosis not present

## 2021-03-17 DIAGNOSIS — H9201 Otalgia, right ear: Secondary | ICD-10-CM

## 2021-03-17 MED ORDER — NEOMYCIN-POLYMYXIN-HC 3.5-10000-1 OT SUSP: 3.0000 [drp] | Freq: Three times a day (TID) | OTIC | 0 refills | Status: AC

## 2021-03-17 MED ORDER — AMOXICILLIN-POT CLAVULANATE 875-125 MG PO TABS: 1.0000 | ORAL_TABLET | Freq: Two times a day (BID) | ORAL | 0 refills | Status: AC

## 2021-03-17 NOTE — ED Triage Notes (Signed)
Pt in with c/o right ear pain that has been going on since Monday but worsening over the last few days  Pt has taken ibuprofen for relief

## 2021-03-17 NOTE — ED Provider Notes (Signed)
[place]    CSN: [id number] Arrival date & time: 03/17/21  1102      History   Chief Complaint Chief Complaint  Patient presents with  . Otalgia    HPI Debbie [name] is a 70 y.o. female.   Debbie [name] presents with complaints of right ear pain which started a few days ago. Pain with touch to the ear. No known drainage. Had similar to left ear a few weeks ago and resolved with drops and oral antibiotics. She has used ear drops to the right ear but it is not helping. Some changes to hearing but no loss. No other URI symptoms. No fevers.    ROS per HPI, negative if not otherwise mentioned.      Past Medical History:  Diagnosis Date  . Acid reflux   . ALLERGIC RHINITIS   . Anxiety   . CAD (coronary artery disease), native coronary artery    coronary CTA 11/2019 with a calcium score of 146 and mild nonobstructive plaque in the mid LAD.  Marland Kitchen Depression   . Diverticulosis   . Dyspnea    with exertion   . Esophageal stricture   . Hiatal hernia    with  Schatzki's Ring  . History of kidney stones   . Hyperlipidemia   . Hypertension   . IBS (irritable bowel syndrome)     Patient Active Problem List   Diagnosis Date Noted  . 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 CAD (coronary artery disease), native coronary artery   . Chest pain 02/09/2018  . SOB (shortness of breath) 02/09/2018  . Anemia 04/23/2017  . Hypokalemia 04/23/2017  . Orthostatic hypotension 04/23/2017  . Diverticulitis large intestine w/o perforation or abscess w/bleeding 04/22/2017  . Hypertension 08/30/2012  . Hypercholesteremia 08/30/2012  . Seasonal and perennial allergic rhinitis 08/18/2012  . Acute recurrent maxillary sinusitis 08/18/2012  . Asthma with bronchitis 08/18/2012    Past Surgical History:  Procedure Laterality Date  . ABDOMINAL HYSTERECTOMY    . ANKLE FRACTURE SURGERY  2012   right  . COLONOSCOPY  multiple  . ESOPHAGOGASTRODUODENOSCOPY  09/14/05    multiple  . KIDNEY STONE SURGERY    . TONSILLECTOMY    . TUBAL LIGATION    . VESICOVAGINAL FISTULA CLOSURE W/ TAH    . XI ROBOTIC ASSISTED HIATAL HERNIA REPAIR N/A 01/13/2021   Procedure: ROBOTIC ASSISTED NISSEN FUNDOPLICATION;  Surgeon: Felicie Morn, MD;  Location: [facility];  Service: General;  Laterality: N/A;    OB History    Gravida  3   Para  3   Term  3   Preterm      AB      Living  3     SAB      IAB      Ectopic      Multiple      Live Births               Home Medications    Prior to Admission medications   Medication Sig Start Date End Date Taking? Authorizing Provider  amoxicillin-clavulanate (AUGMENTIN) 875-125 MG tablet Take 1 tablet by mouth every 12 (twelve) hours for 5 days. 03/17/21 03/22/21 Yes Olis Viverette, Malachy Moan, NP  neomycin-polymyxin-hydrocortisone (CORTISPORIN) 3.5-10000-1 OTIC suspension Place 3 drops into the right ear 3 (three) times daily for 10 days. 03/17/21 03/27/21 Yes Augusto Gamble B, NP  aspirin EC 81 MG tablet Take 1 tablet (81 mg total) by mouth daily. 11/08/19  [name], MD  atorvastatin (LIPITOR) 80 MG tablet Take 80 mg by mouth daily.    [provider]  cetirizine (ZYRTEC) 5 MG tablet Take 5 mg by mouth daily.    [provider]  Cholecalciferol (VITAMIN D-3) 1000 units CAPS Take 1,000 Units by mouth daily.    [provider]  CYMBALTA 60 MG capsule Take 120 mg by mouth at bedtime. 05/21/12   [provider]  dicyclomine (BENTYL) 20 MG tablet Take 20 mg by mouth in the morning, at noon, in the evening, and at bedtime. 01/31/17   [provider]  ezetimibe (ZETIA) 10 MG tablet Take 1 tablet (10 mg total) by mouth daily. 11/23/20   [name], MD  famotidine (PEPCID) 20 MG tablet Take 20 mg by mouth 2 (two) times daily. 09/21/20   [provider]  fluticasone (FLONASE) 50 MCG/ACT nasal spray Place 2 sprays into both nostrils daily as needed for allergies or  rhinitis.  12/13/13   [provider]  ibuprofen (ADVIL) 600 MG tablet Take 1 tablet (600 mg total) by mouth every 8 (eight) hours as needed. Patient taking differently: Take 600 mg by mouth every 8 (eight) hours as needed for mild pain or headache. 02/25/20   Jaynee Eagles, PA-C  lisinopril-hydrochlorothiazide (PRINZIDE,ZESTORETIC) 20-12.5 MG tablet Take 1 tablet by mouth daily. Patient not taking: No sig reported 04/26/17   Eugenie Filler, MD  lisinopril-hydrochlorothiazide (ZESTORETIC) 10-12.5 MG tablet Take 1 tablet by mouth daily at 12 noon. 12/04/20   [provider]  Magnesium 500 MG TABS Take 500 mg by mouth daily.    [provider]  Multiple Vitamin (MULTIVITAMIN) capsule Take 1 capsule by mouth every morning.     [provider]  oxyCODONE-acetaminophen (PERCOCET) 5-325 MG tablet Take 1 tablet by mouth every 4 (four) hours as needed for severe pain. 01/14/21 01/14/22  Stechschulte, Nickola Major, MD  pantoprazole (PROTONIX) 40 MG tablet Take 40 mg by mouth 2 (two) times daily. 02/10/17   [provider]  potassium chloride SA (KLOR-CON) 20 MEQ tablet Take 2 tablets (40 mEq total) by mouth 2 (two) times daily. 09/07/20   [name], MD    Family History Family History  Problem Relation Age of Onset  . Allergies Mother   . Heart disease Mother        bypass surgery  . Hypertension Mother   . Hyperlipidemia Mother   . Heart disease Father        bypass surgery  . Hypertension Father   . Hyperlipidemia Father   . Stroke Father   . Allergies Daughter   . Allergies Son   . Diabetes Paternal Grandmother   . Colon cancer Neg Hx   . Esophageal cancer Neg Hx   . Rectal cancer Neg Hx   . Stomach cancer Neg Hx     Social History Social History   Tobacco Use  . Smoking status: Never Smoker  . Smokeless tobacco: Never Used  Vaping Use  . Vaping Use: Never used  Substance Use Topics  . Alcohol use: No  . Drug use: No     Allergies    Patient has no known allergies.   Review of Systems Review of Systems   Physical Exam Triage Vital Signs ED Triage Vitals  Enc Vitals Group     BP 03/17/21 1129 (!) 162/101     Pulse Rate 03/17/21 1129 85     Resp 03/17/21 1129 17  Temp 03/17/21 1129 99.1 F (37.3 C)     Temp src --      SpO2 03/17/21 1129 96 %     Weight --      Height --      Head Circumference --      Peak Flow --      Pain Score 03/17/21 1125 6     Pain Loc --      Pain Edu? --      Excl. in [place]? --    No data found.  Updated Vital Signs BP (!) 162/101   Pulse 85   Temp 99.1 F (37.3 C)   Resp 17   SpO2 96%   Visual Acuity Right Eye Distance:   Left Eye Distance:   Bilateral Distance:    Right Eye Near:   Left Eye Near:    Bilateral Near:     Physical Exam Constitutional:      General: She is not in acute distress.    Appearance: She is well-developed.  HENT:     Right Ear: Drainage and tenderness present.     Ears:     Comments: Opaque right TM with white discharge in canal, very tender with exam Cardiovascular:     Rate and Rhythm: Normal rate.  Pulmonary:     Effort: Pulmonary effort is normal.  Skin:    General: Skin is warm and dry.  Neurological:     Mental Status: She is alert and oriented to person, place, and time.      UC Treatments / Results  Labs (all labs ordered are listed, but only abnormal results are displayed) Labs Reviewed - No data to display  EKG   Radiology No results found.  Procedures Procedures (including critical care time)  Medications Ordered in UC Medications - No data to display  Initial Impression / Assessment and Plan / UC Course  I have reviewed the triage vital signs and the nursing notes.  Pertinent labs & imaging results that were available during my care of the patient were reviewed by me and considered in my medical decision making (see chart for details).     AOE on exam with maybe mild AOM. Return precautions  provided. Patient verbalized understanding and agreeable to plan.   Final Clinical Impressions(s) / UC Diagnoses   Final diagnoses:  Otalgia of right ear  Acute otitis externa of right ear, unspecified type     Discharge Instructions     Use ear drops as provided, as well as 5 days of oral antibiotics.  If symptoms worsen or do not improve in the next week to return to be seen or to follow up with your PCP.     ED Prescriptions    Medication Sig Dispense Auth. Provider   neomycin-polymyxin-hydrocortisone (CORTISPORIN) 3.5-10000-1 OTIC suspension Place 3 drops into the right ear 3 (three) times daily for 10 days. 10 mL Augusto Gamble B, NP   amoxicillin-clavulanate (AUGMENTIN) 875-125 MG tablet Take 1 tablet by mouth every 12 (twelve) hours for 5 days. 10 tablet Zigmund Gottron, NP     PDMP not reviewed this encounter.   Zigmund Gottron, NP 03/17/21 1222

## 2021-03-17 NOTE — Discharge Instructions (Signed)
Use ear drops as provided, as well as 5 days of oral antibiotics.  If symptoms worsen or do not improve in the next week to return to be seen or to follow up with your PCP.

## 2021-04-09 DIAGNOSIS — E785 Hyperlipidemia, unspecified: Secondary | ICD-10-CM | POA: Diagnosis not present

## 2021-04-09 DIAGNOSIS — I10 Essential (primary) hypertension: Secondary | ICD-10-CM | POA: Diagnosis not present

## 2021-04-09 DIAGNOSIS — R7303 Prediabetes: Secondary | ICD-10-CM | POA: Diagnosis not present

## 2021-04-09 DIAGNOSIS — Z79899 Other long term (current) drug therapy: Secondary | ICD-10-CM | POA: Diagnosis not present

## 2021-04-09 DIAGNOSIS — E559 Vitamin D deficiency, unspecified: Secondary | ICD-10-CM | POA: Diagnosis not present

## 2021-04-09 DIAGNOSIS — K219 Gastro-esophageal reflux disease without esophagitis: Secondary | ICD-10-CM | POA: Diagnosis not present

## 2021-04-09 DIAGNOSIS — Z1159 Encounter for screening for other viral diseases: Secondary | ICD-10-CM | POA: Diagnosis not present

## 2021-04-09 DIAGNOSIS — F324 Major depressive disorder, single episode, in partial remission: Secondary | ICD-10-CM | POA: Diagnosis not present

## 2021-04-12 DIAGNOSIS — E785 Hyperlipidemia, unspecified: Secondary | ICD-10-CM | POA: Diagnosis not present

## 2021-04-12 DIAGNOSIS — Z79899 Other long term (current) drug therapy: Secondary | ICD-10-CM | POA: Diagnosis not present

## 2021-04-12 DIAGNOSIS — E559 Vitamin D deficiency, unspecified: Secondary | ICD-10-CM | POA: Diagnosis not present

## 2021-04-12 DIAGNOSIS — R7309 Other abnormal glucose: Secondary | ICD-10-CM | POA: Diagnosis not present

## 2021-04-12 DIAGNOSIS — I10 Essential (primary) hypertension: Secondary | ICD-10-CM | POA: Diagnosis not present

## 2021-04-12 DIAGNOSIS — K219 Gastro-esophageal reflux disease without esophagitis: Secondary | ICD-10-CM | POA: Diagnosis not present

## 2021-04-14 ENCOUNTER — Other Ambulatory Visit: Payer: Self-pay | Admitting: Family Medicine

## 2021-04-14 DIAGNOSIS — E2839 Other primary ovarian failure: Secondary | ICD-10-CM

## 2021-04-25 DIAGNOSIS — K5792 Diverticulitis of intestine, part unspecified, without perforation or abscess without bleeding: Secondary | ICD-10-CM | POA: Diagnosis not present

## 2021-04-28 DIAGNOSIS — I10 Essential (primary) hypertension: Secondary | ICD-10-CM | POA: Diagnosis not present

## 2021-04-28 DIAGNOSIS — K219 Gastro-esophageal reflux disease without esophagitis: Secondary | ICD-10-CM | POA: Diagnosis not present

## 2021-04-28 DIAGNOSIS — F4321 Adjustment disorder with depressed mood: Secondary | ICD-10-CM | POA: Diagnosis not present

## 2021-04-28 DIAGNOSIS — F324 Major depressive disorder, single episode, in partial remission: Secondary | ICD-10-CM | POA: Diagnosis not present

## 2021-04-28 DIAGNOSIS — E782 Mixed hyperlipidemia: Secondary | ICD-10-CM | POA: Diagnosis not present

## 2021-05-24 DIAGNOSIS — E785 Hyperlipidemia, unspecified: Secondary | ICD-10-CM | POA: Diagnosis not present

## 2021-05-24 DIAGNOSIS — K219 Gastro-esophageal reflux disease without esophagitis: Secondary | ICD-10-CM | POA: Diagnosis not present

## 2021-05-24 DIAGNOSIS — E782 Mixed hyperlipidemia: Secondary | ICD-10-CM | POA: Diagnosis not present

## 2021-05-24 DIAGNOSIS — F4321 Adjustment disorder with depressed mood: Secondary | ICD-10-CM | POA: Diagnosis not present

## 2021-05-24 DIAGNOSIS — F324 Major depressive disorder, single episode, in partial remission: Secondary | ICD-10-CM | POA: Diagnosis not present

## 2021-05-24 DIAGNOSIS — I10 Essential (primary) hypertension: Secondary | ICD-10-CM | POA: Diagnosis not present

## 2021-07-06 DIAGNOSIS — J069 Acute upper respiratory infection, unspecified: Secondary | ICD-10-CM | POA: Diagnosis not present

## 2021-07-06 DIAGNOSIS — J0100 Acute maxillary sinusitis, unspecified: Secondary | ICD-10-CM | POA: Diagnosis not present

## 2021-08-03 DIAGNOSIS — E782 Mixed hyperlipidemia: Secondary | ICD-10-CM | POA: Diagnosis not present

## 2021-08-03 DIAGNOSIS — K219 Gastro-esophageal reflux disease without esophagitis: Secondary | ICD-10-CM | POA: Diagnosis not present

## 2021-08-03 DIAGNOSIS — F4321 Adjustment disorder with depressed mood: Secondary | ICD-10-CM | POA: Diagnosis not present

## 2021-08-03 DIAGNOSIS — I10 Essential (primary) hypertension: Secondary | ICD-10-CM | POA: Diagnosis not present

## 2021-08-03 DIAGNOSIS — E785 Hyperlipidemia, unspecified: Secondary | ICD-10-CM | POA: Diagnosis not present

## 2021-08-03 DIAGNOSIS — F324 Major depressive disorder, single episode, in partial remission: Secondary | ICD-10-CM | POA: Diagnosis not present

## 2021-09-16 DIAGNOSIS — K581 Irritable bowel syndrome with constipation: Secondary | ICD-10-CM | POA: Diagnosis not present

## 2021-09-16 DIAGNOSIS — F331 Major depressive disorder, recurrent, moderate: Secondary | ICD-10-CM | POA: Diagnosis not present

## 2021-09-16 DIAGNOSIS — K219 Gastro-esophageal reflux disease without esophagitis: Secondary | ICD-10-CM | POA: Diagnosis not present

## 2021-09-16 DIAGNOSIS — R7303 Prediabetes: Secondary | ICD-10-CM | POA: Diagnosis not present

## 2021-09-16 DIAGNOSIS — I10 Essential (primary) hypertension: Secondary | ICD-10-CM | POA: Diagnosis not present

## 2021-09-16 DIAGNOSIS — Z0000 Encounter for general adult medical examination without abnormal findings: Secondary | ICD-10-CM | POA: Diagnosis not present

## 2021-09-16 DIAGNOSIS — K5904 Chronic idiopathic constipation: Secondary | ICD-10-CM | POA: Diagnosis not present

## 2021-09-16 DIAGNOSIS — E559 Vitamin D deficiency, unspecified: Secondary | ICD-10-CM | POA: Diagnosis not present

## 2021-09-16 DIAGNOSIS — E785 Hyperlipidemia, unspecified: Secondary | ICD-10-CM | POA: Diagnosis not present

## 2021-09-23 ENCOUNTER — Other Ambulatory Visit: Payer: Medicare HMO

## 2021-09-30 DIAGNOSIS — R7303 Prediabetes: Secondary | ICD-10-CM | POA: Diagnosis not present

## 2021-09-30 DIAGNOSIS — I10 Essential (primary) hypertension: Secondary | ICD-10-CM | POA: Diagnosis not present

## 2021-10-24 DIAGNOSIS — K5792 Diverticulitis of intestine, part unspecified, without perforation or abscess without bleeding: Secondary | ICD-10-CM | POA: Diagnosis not present

## 2021-11-12 DIAGNOSIS — R5383 Other fatigue: Secondary | ICD-10-CM | POA: Diagnosis not present

## 2021-11-12 DIAGNOSIS — S46111A Strain of muscle, fascia and tendon of long head of biceps, right arm, initial encounter: Secondary | ICD-10-CM | POA: Diagnosis not present

## 2021-11-12 DIAGNOSIS — R519 Headache, unspecified: Secondary | ICD-10-CM | POA: Diagnosis not present

## 2021-12-04 IMAGING — CT CT CHEST W/ CM
3 series · 15 of 32 positions shown, 18 images · IV contrast (APPLIED)
Comparison: None.

CLINICAL DATA: Hiatal hernia, shortness of breath

EXAM:
CT CHEST AND ABDOMEN WITH CONTRAST
TECHNIQUE: Multidetector CT imaging of the chest and abdomen was performed
following the standard protocol during bolus administration of
intravenous contrast.
CONTRAST:  100mL MP5J7D-8PP IOPAMIDOL (MP5J7D-8PP) INJECTION 61%,
additional oral enteric contrast

[Series 2: chest/abd/pelvis w/cm · axial · 0.77mm/px · z∈[-450,-94]mm · 8 of 87 slices shown]
[im 8/87  soft-tissue]
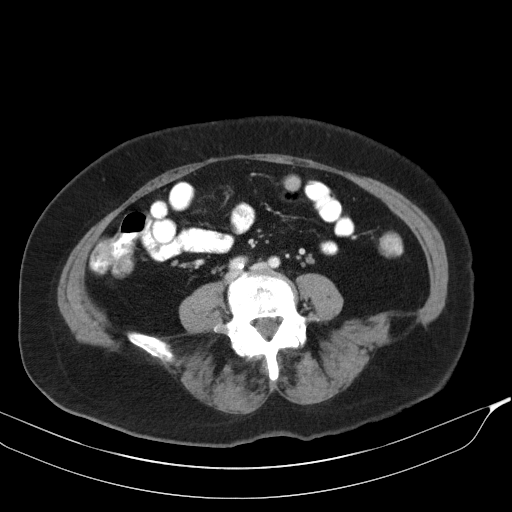
[im 16/87  soft-tissue]
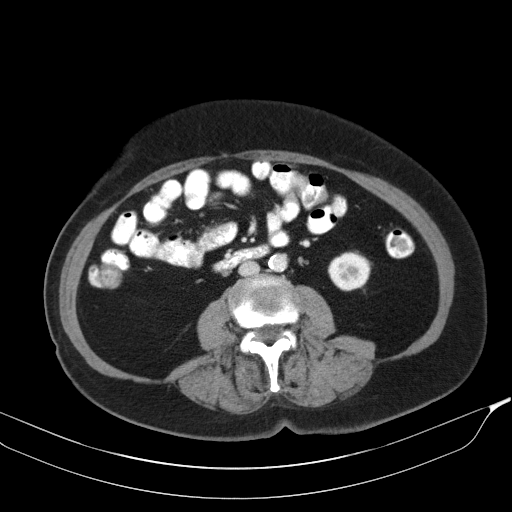
[im 32/87  soft-tissue]
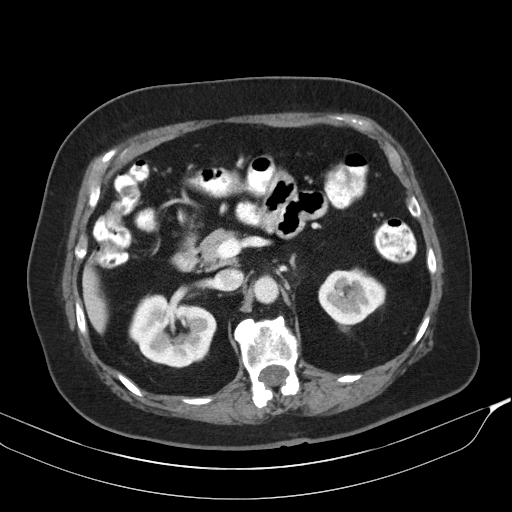
[im 40/87  soft-tissue]
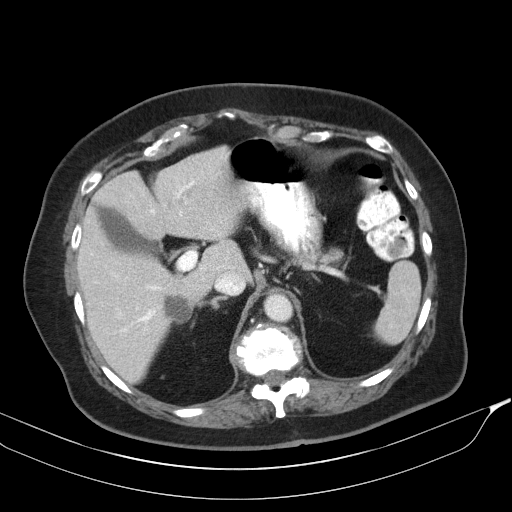
[im 47/87  soft-tissue]
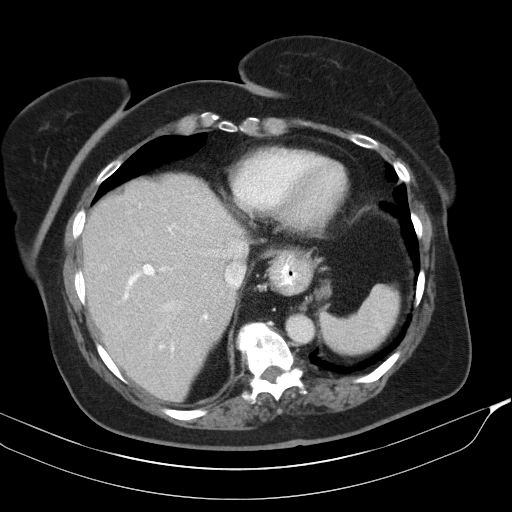
[im 55/87  soft-tissue]
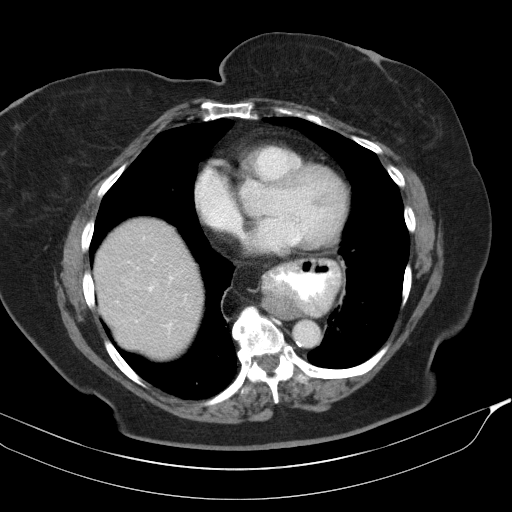
[im 71/87  soft-tissue]
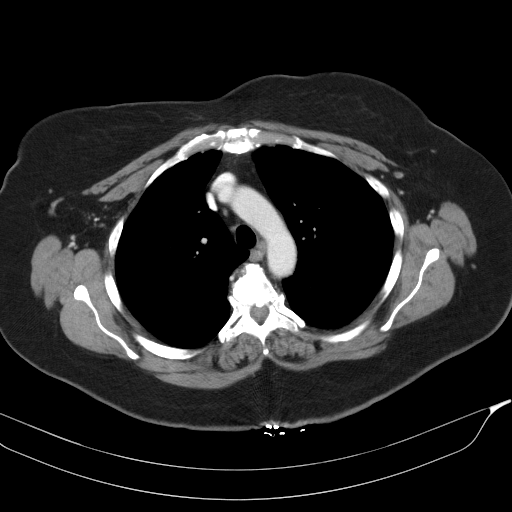
[im 79/87  soft-tissue]
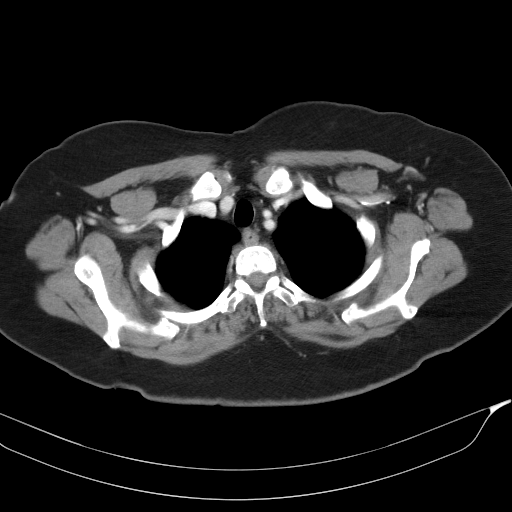

[Series 6: lung · axial · 0.76mm/px · z∈[-288,-172]mm · 5 of 126 slices shown]
[im 9/126  bone]
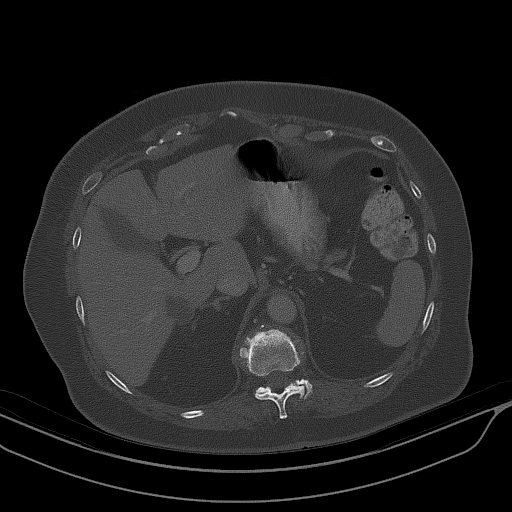
[im 26/126  bone]
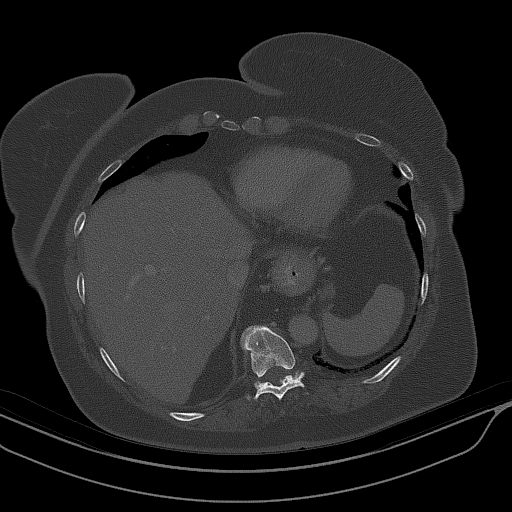
[im 42/126  bone]
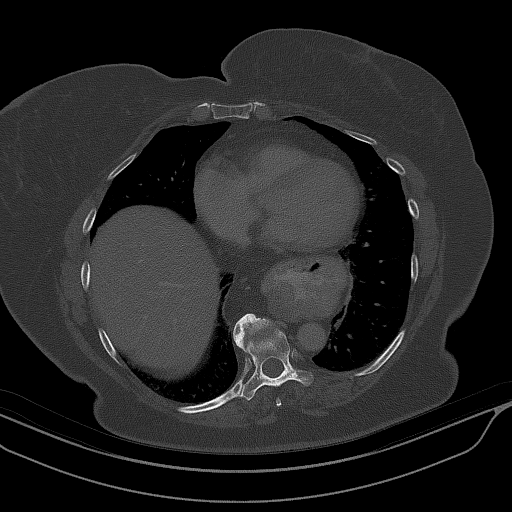
[im 59/126  bone]
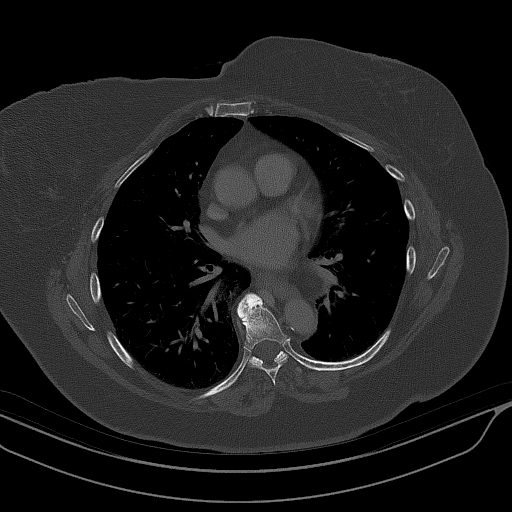
[im 67/126  bone]
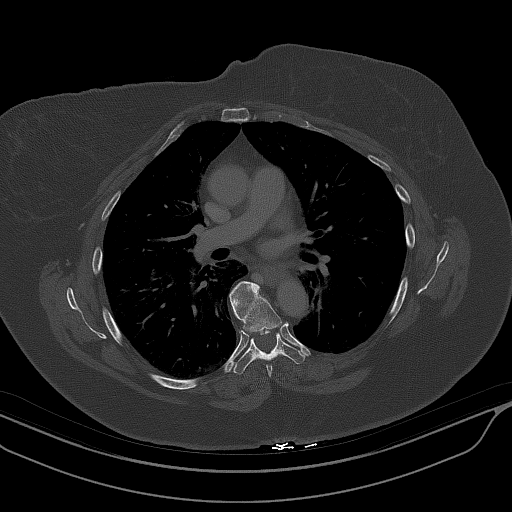

[Series 7: renal delay · axial · delayed · 0.77mm/px · z∈[-390,-335]mm · 2 of 34 slices shown, 5 images]
[im 12/34  soft-tissue]
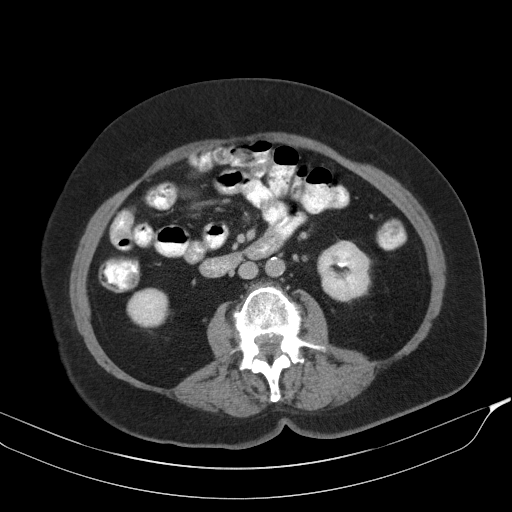
[im 12/34  lung]
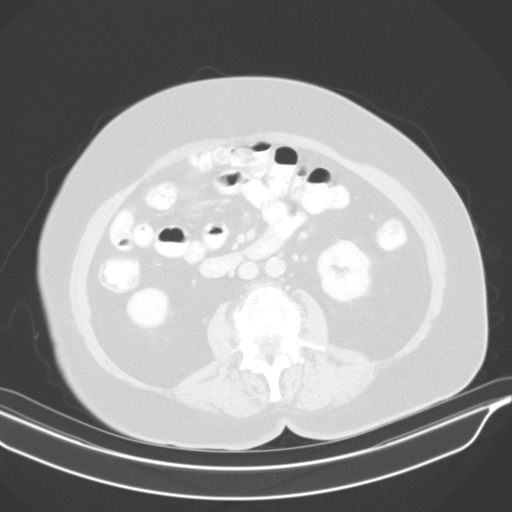
[im 12/34  bone]
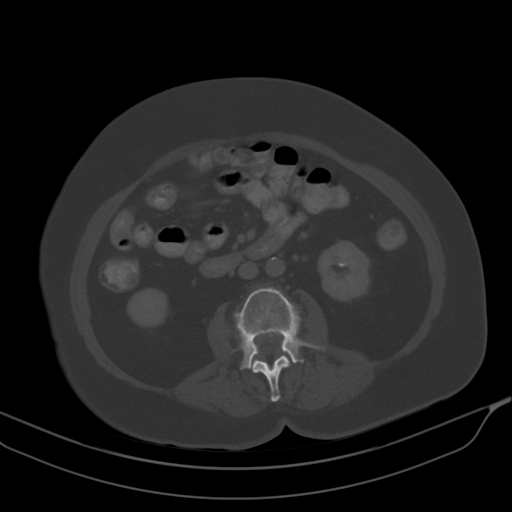
[im 23/34  soft-tissue]
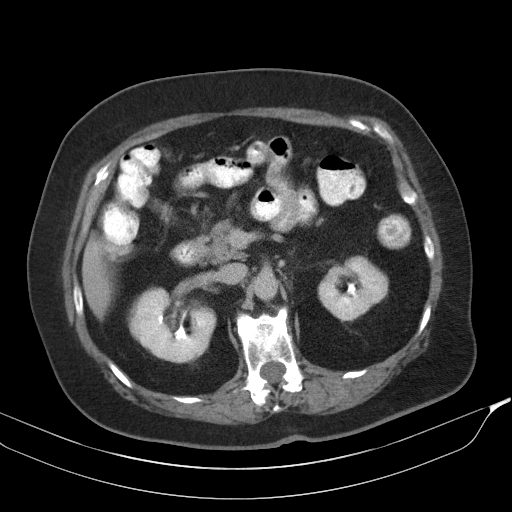
[im 23/34  lung]
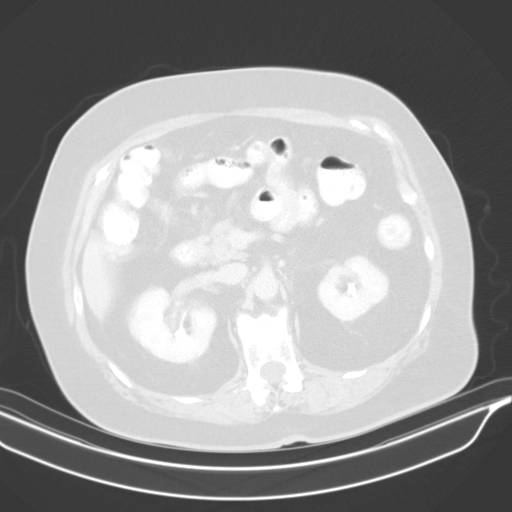

[15 of 32 positions shown; findings below may reference images not displayed]

FINDINGS: CT CHEST FINDINGS

Cardiovascular: Aortic atherosclerosis. Normal heart size. No
pericardial effusion.

Mediastinum/Nodes: No enlarged mediastinal, hilar, or axillary lymph
nodes. Moderate hiatal hernia with intrathoracic position of the
gastric body and fundus. Thyroid gland, trachea, and esophagus
demonstrate no significant findings.

Lungs/Pleura: Minimal, generally bandlike scarring and/or partial
atelectasis of the lung bases. Mild, tubular bronchiectasis most
conspicuous in the right lower lobe (series 6, image 65). No
pleural effusion or pneumothorax.

Musculoskeletal: No chest wall mass or suspicious bone lesions
identified.

CT ABDOMEN FINDINGS

Hepatobiliary: Subcapsular fluid attenuation cyst of the posterior
right lobe of the liver, which is reduced in size compared to prior
examination dated 04/22/2017 (series 2, image 48). No gallstones,
gallbladder wall thickening, or biliary dilatation.

Pancreas: Unremarkable. No pancreatic ductal dilatation or
surrounding inflammatory changes.

Spleen: Normal in size without focal abnormality.

Adrenals/Urinary Tract: Adrenal glands are unremarkable. Kidneys are
normal, without renal calculi, focal lesion, or hydronephrosis.
Bladder is unremarkable.

Stomach/Bowel: Stomach is within normal limits. Appendix appears
normal. No evidence of bowel wall thickening, distention, or
inflammatory changes.

Vascular/Lymphatic: Aortic atherosclerosis. No enlarged abdominal or
pelvic lymph nodes.

Other: No abdominal wall hernia or abnormality. No abdominopelvic
ascites.

Musculoskeletal: No acute or significant osseous findings.
IMPRESSION: 1. Moderate hiatal hernia with intrathoracic position of the gastric
body and fundus.
2. Minimal, generally bandlike scarring and/or partial atelectasis
of the lung bases. Mild, tubular bronchiectasis most conspicuous in
the right lower lobe. Findings are most likely related to prior
infection or aspiration without specific features to suggest
fibrotic interstitial lung disease.
3. Aortic Atherosclerosis (9GKDD-03D.D).

## 2021-12-04 IMAGING — CT CT ABDOMEN W/ CM
3 series · 15 of 32 positions shown, 18 images · IV contrast (iopamidol)
Comparison: None.

CLINICAL DATA: Hiatal hernia, shortness of breath

EXAM:
CT CHEST AND ABDOMEN WITH CONTRAST
TECHNIQUE: Multidetector CT imaging of the chest and abdomen was performed
following the standard protocol during bolus administration of
intravenous contrast.
CONTRAST:  100mL MP5J7D-8PP IOPAMIDOL (MP5J7D-8PP) INJECTION 61%,
additional oral enteric contrast

[Series 2: chest/abd/pelvis w/cm · axial · 0.77mm/px · z∈[-450,-94]mm · 8 of 87 slices shown]
[im 8/87  soft-tissue]
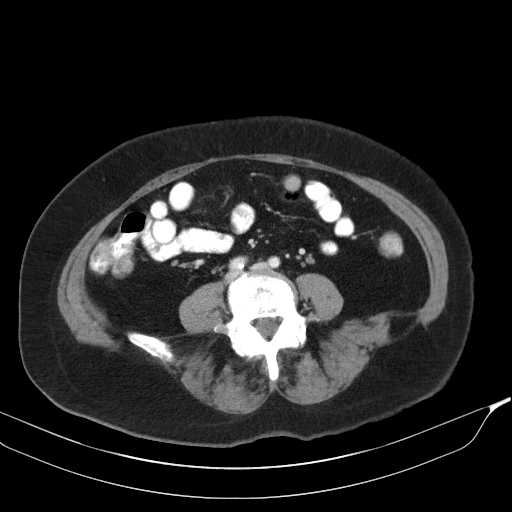
[im 16/87  soft-tissue]
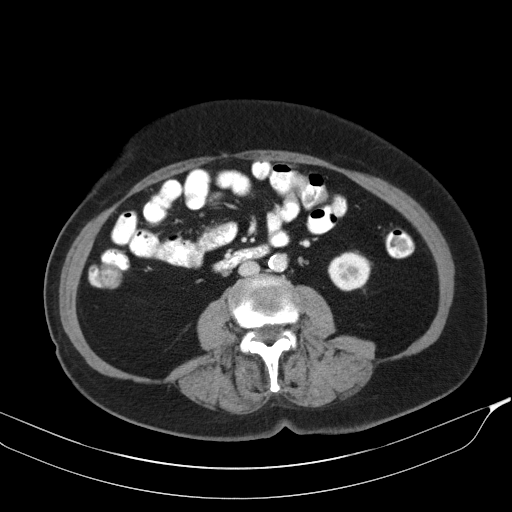
[im 32/87  soft-tissue]
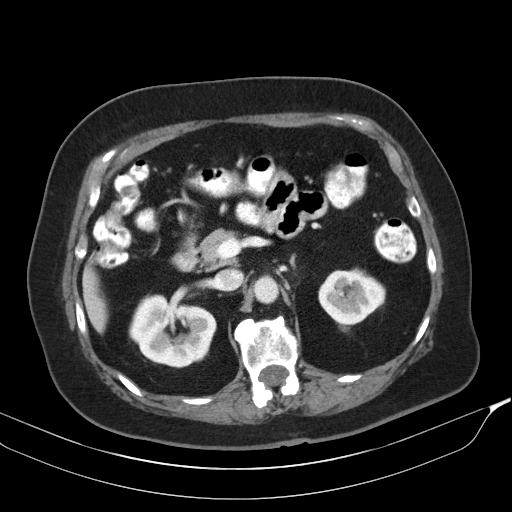
[im 40/87  soft-tissue]
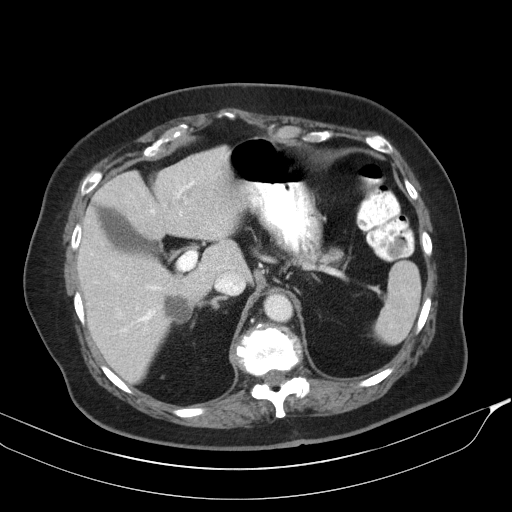
[im 47/87  soft-tissue]
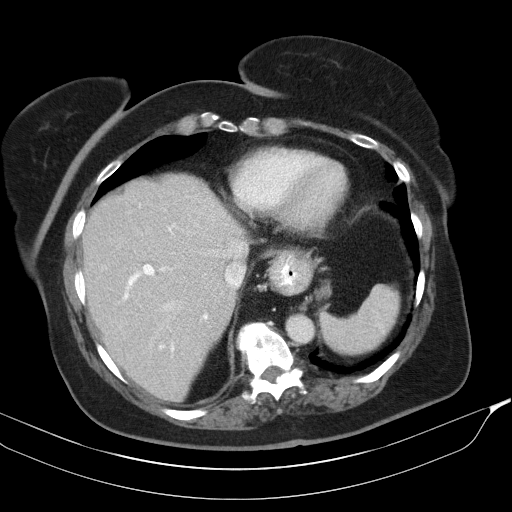
[im 55/87  soft-tissue]
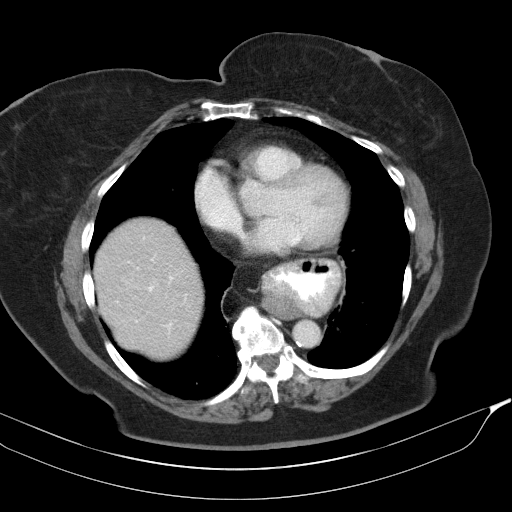
[im 71/87  soft-tissue]
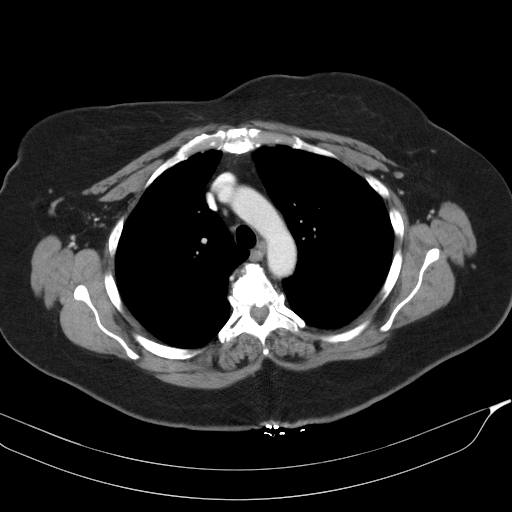
[im 79/87  soft-tissue]
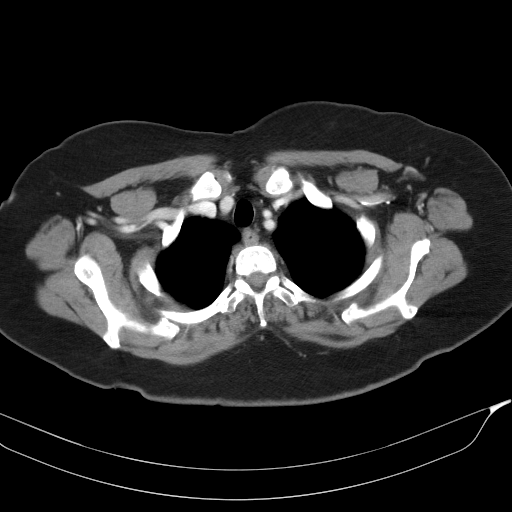

[Series 6: lung · axial · 0.76mm/px · z∈[-288,-172]mm · 5 of 126 slices shown]
[im 9/126  bone]
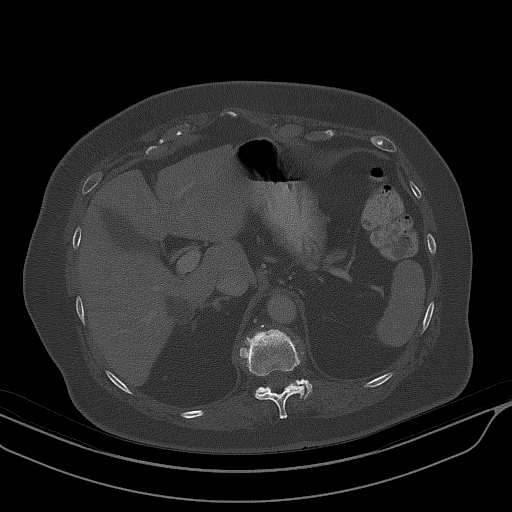
[im 26/126  bone]
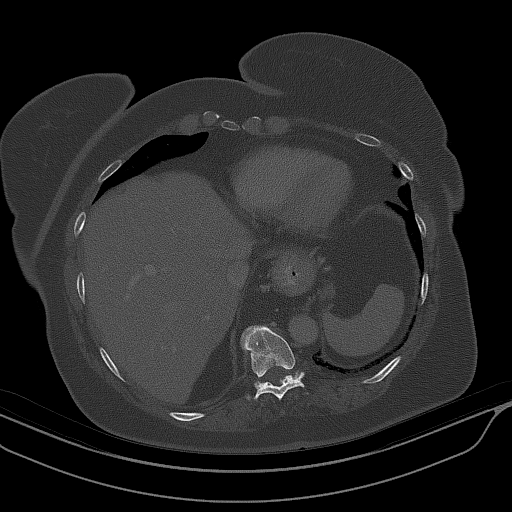
[im 42/126  bone]
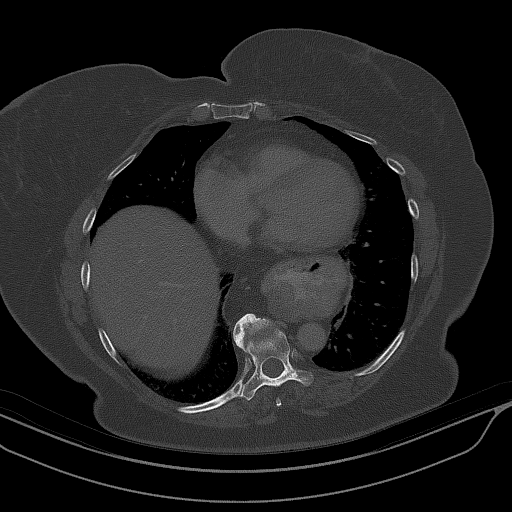
[im 59/126  bone]
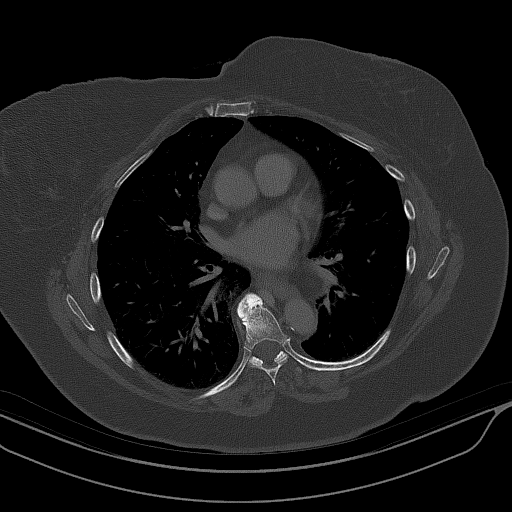
[im 67/126  bone]
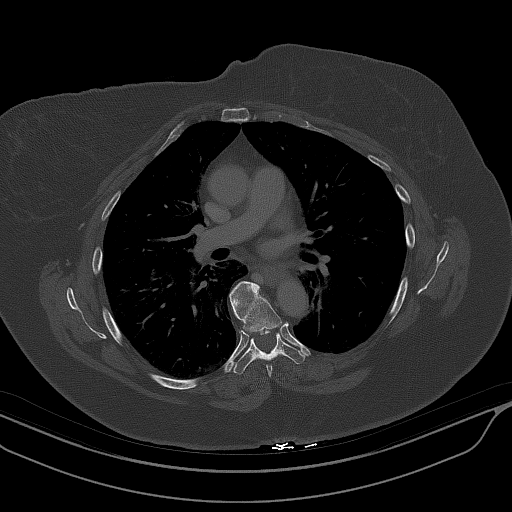

[Series 7: renal delay · axial · delayed · 0.77mm/px · z∈[-390,-335]mm · 2 of 34 slices shown, 5 images]
[im 12/34  soft-tissue]
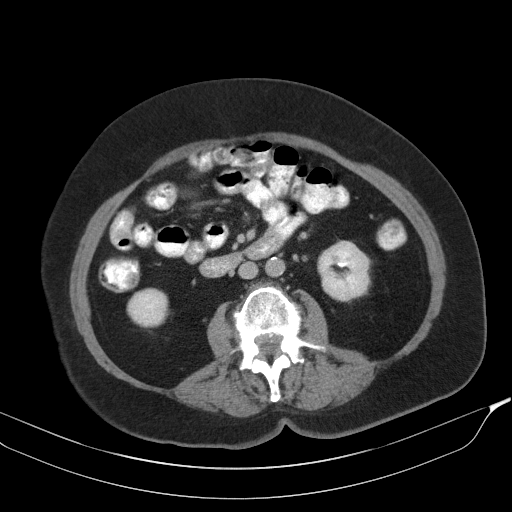
[im 12/34  lung]
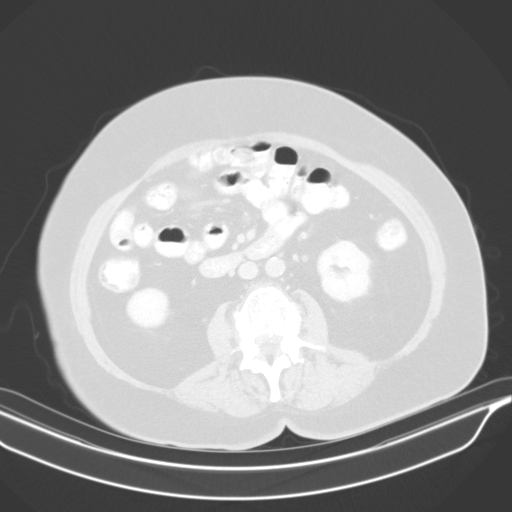
[im 12/34  bone]
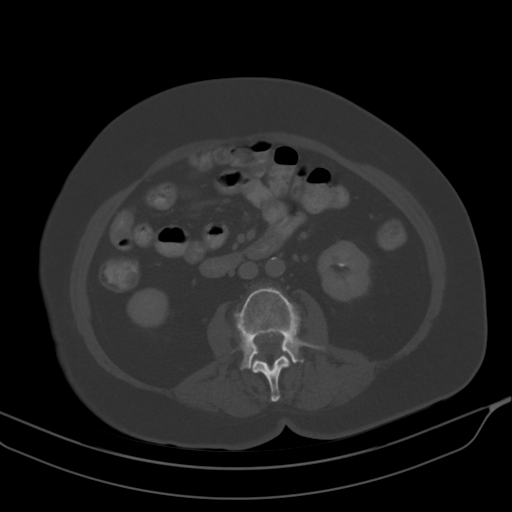
[im 23/34  soft-tissue]
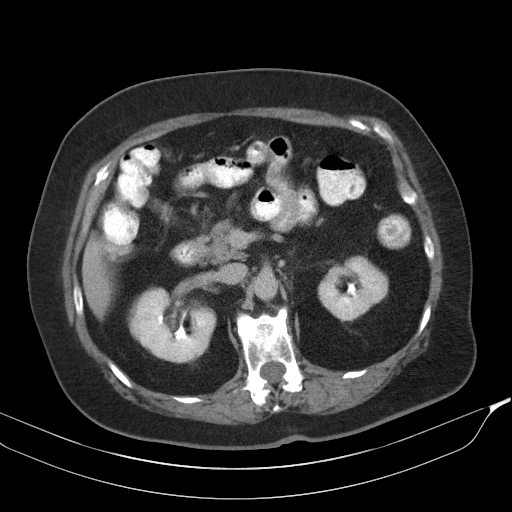
[im 23/34  lung]
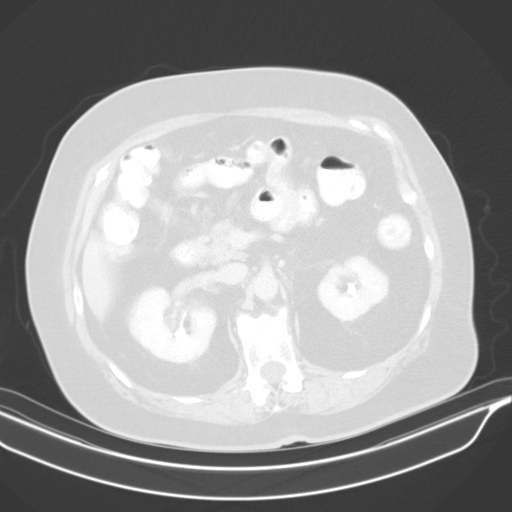

[15 of 32 positions shown; findings below may reference images not displayed]

FINDINGS: CT CHEST FINDINGS

Cardiovascular: Aortic atherosclerosis. Normal heart size. No
pericardial effusion.

Mediastinum/Nodes: No enlarged mediastinal, hilar, or axillary lymph
nodes. Moderate hiatal hernia with intrathoracic position of the
gastric body and fundus. Thyroid gland, trachea, and esophagus
demonstrate no significant findings.

Lungs/Pleura: Minimal, generally bandlike scarring and/or partial
atelectasis of the lung bases. Mild, tubular bronchiectasis most
conspicuous in the right lower lobe (series 6, image 65). No
pleural effusion or pneumothorax.

Musculoskeletal: No chest wall mass or suspicious bone lesions
identified.

CT ABDOMEN FINDINGS

Hepatobiliary: Subcapsular fluid attenuation cyst of the posterior
right lobe of the liver, which is reduced in size compared to prior
examination dated 04/22/2017 (series 2, image 48). No gallstones,
gallbladder wall thickening, or biliary dilatation.

Pancreas: Unremarkable. No pancreatic ductal dilatation or
surrounding inflammatory changes.

Spleen: Normal in size without focal abnormality.

Adrenals/Urinary Tract: Adrenal glands are unremarkable. Kidneys are
normal, without renal calculi, focal lesion, or hydronephrosis.
Bladder is unremarkable.

Stomach/Bowel: Stomach is within normal limits. Appendix appears
normal. No evidence of bowel wall thickening, distention, or
inflammatory changes.

Vascular/Lymphatic: Aortic atherosclerosis. No enlarged abdominal or
pelvic lymph nodes.

Other: No abdominal wall hernia or abnormality. No abdominopelvic
ascites.

Musculoskeletal: No acute or significant osseous findings.
IMPRESSION: 1. Moderate hiatal hernia with intrathoracic position of the gastric
body and fundus.
2. Minimal, generally bandlike scarring and/or partial atelectasis
of the lung bases. Mild, tubular bronchiectasis most conspicuous in
the right lower lobe. Findings are most likely related to prior
infection or aspiration without specific features to suggest
fibrotic interstitial lung disease.
3. Aortic Atherosclerosis (9GKDD-03D.D).

## 2022-01-07 DIAGNOSIS — F324 Major depressive disorder, single episode, in partial remission: Secondary | ICD-10-CM | POA: Diagnosis not present

## 2022-01-07 DIAGNOSIS — E785 Hyperlipidemia, unspecified: Secondary | ICD-10-CM | POA: Diagnosis not present

## 2022-01-07 DIAGNOSIS — R7303 Prediabetes: Secondary | ICD-10-CM | POA: Diagnosis not present

## 2022-01-07 DIAGNOSIS — L900 Lichen sclerosus et atrophicus: Secondary | ICD-10-CM | POA: Diagnosis not present

## 2022-01-07 DIAGNOSIS — F419 Anxiety disorder, unspecified: Secondary | ICD-10-CM | POA: Diagnosis not present

## 2022-01-07 DIAGNOSIS — I10 Essential (primary) hypertension: Secondary | ICD-10-CM | POA: Diagnosis not present

## 2022-01-07 DIAGNOSIS — L219 Seborrheic dermatitis, unspecified: Secondary | ICD-10-CM | POA: Diagnosis not present

## 2022-01-07 DIAGNOSIS — N898 Other specified noninflammatory disorders of vagina: Secondary | ICD-10-CM | POA: Diagnosis not present

## 2022-01-07 DIAGNOSIS — I700 Atherosclerosis of aorta: Secondary | ICD-10-CM | POA: Diagnosis not present

## 2022-02-26 ENCOUNTER — Encounter (HOSPITAL_COMMUNITY): Payer: Self-pay | Admitting: Emergency Medicine

## 2022-02-26 ENCOUNTER — Ambulatory Visit (HOSPITAL_COMMUNITY)
Admission: EM | Admit: 2022-02-26 | Discharge: 2022-02-26 | Disposition: A | Discharge status: Home / Self Care | Payer: Medicare HMO | Attending: Nurse Practitioner | Admitting: Nurse Practitioner

## 2022-02-26 ENCOUNTER — Other Ambulatory Visit: Payer: Self-pay

## 2022-02-26 DIAGNOSIS — J0100 Acute maxillary sinusitis, unspecified: Secondary | ICD-10-CM | POA: Diagnosis not present

## 2022-02-26 DIAGNOSIS — H60392 Other infective otitis externa, left ear: Secondary | ICD-10-CM

## 2022-02-26 MED ORDER — CIPROFLOXACIN-DEXAMETHASONE 0.3-0.1 % OT SUSP
4.0000 [drp] | Freq: Two times a day (BID) | OTIC | 0 refills | Status: DC
Start: 2022-02-26 — End: 2022-10-27

## 2022-02-26 MED ORDER — AMOXICILLIN-POT CLAVULANATE 875-125 MG PO TABS
1.0000 | ORAL_TABLET | Freq: Two times a day (BID) | ORAL | 0 refills | Status: AC
Start: 2022-02-26 — End: 2022-03-05

## 2022-02-26 NOTE — ED Provider Notes (Signed)
?[place] ? ? ? ?CSN: [id number] ?Arrival date & time: 02/26/22  1334 ? ? ?  ? ?History   ?Chief Complaint ?Chief Complaint  ?Patient presents with  ? Abdominal Pain  ? ? ?HPI ?Debbie [name] is a 71 y.o. female.  ? ?Patient reports about 1 week ago, she was sick with a cold.  She feels she has mostly recovered from this, however is still having left ear pain and sinus pain and pressure.  She denies drainage from the left ear.  She also denies fevers, nausea/vomiting.  She had a little bit of diarrhea earlier this week that got better with Imodium.  Her appetite has remained slightly decreased.  She has been taking Dayquil and reports her blood pressure has been elevated at home.  ? ? ?Past Medical History:  ?Diagnosis Date  ? Acid reflux   ? ALLERGIC RHINITIS   ? Anxiety   ? CAD (coronary artery disease), native coronary artery   ? coronary CTA 11/2019 with a calcium score of 146 and mild nonobstructive plaque in the mid LAD.  ? Depression   ? Diverticulosis   ? Dyspnea   ? with exertion   ? Esophageal stricture   ? Hiatal hernia   ? with  Schatzki's Ring  ? History of kidney stones   ? Hyperlipidemia   ? Hypertension   ? IBS (irritable bowel syndrome)   ? ? ?Patient Active Problem List  ? Diagnosis Date Noted  ? 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 CAD (coronary artery disease), native coronary artery   ? Chest pain 02/09/2018  ? SOB (shortness of breath) 02/09/2018  ? Anemia 04/23/2017  ? Hypokalemia 04/23/2017  ? Orthostatic hypotension 04/23/2017  ? Diverticulitis large intestine w/o perforation or abscess w/bleeding 04/22/2017  ? Hypertension 08/30/2012  ? Hypercholesteremia 08/30/2012  ? Seasonal and perennial allergic rhinitis 08/18/2012  ? Acute recurrent maxillary sinusitis 08/18/2012  ? Asthma with bronchitis 08/18/2012  ? ? ?Past Surgical History:  ?Procedure Laterality Date  ? ABDOMINAL HYSTERECTOMY    ? ANKLE FRACTURE SURGERY  2012  ? right  ? COLONOSCOPY  multiple  ?  ESOPHAGOGASTRODUODENOSCOPY  09/14/05  ? multiple  ? KIDNEY STONE SURGERY    ? TONSILLECTOMY    ? TUBAL LIGATION    ? VESICOVAGINAL FISTULA CLOSURE W/ TAH    ? XI ROBOTIC ASSISTED HIATAL HERNIA REPAIR N/A 01/13/2021  ? Procedure: ROBOTIC ASSISTED NISSEN FUNDOPLICATION;  Surgeon: Felicie Morn, MD;  Location: [facility];  Service: General;  Laterality: N/A;  ? ? ?OB History   ? ? Gravida  ?3  ? Para  ?3  ? Term  ?3  ? Preterm  ?   ? AB  ?   ? Living  ?3  ?  ? ? SAB  ?   ? IAB  ?   ? Ectopic  ?   ? Multiple  ?   ? Live Births  ?   ?   ?  ?  ? ? ? ?Home Medications   ? ?Prior to Admission medications   ?Medication Sig Start Date End Date Taking? Authorizing Provider  ?amoxicillin-clavulanate (AUGMENTIN) 875-125 MG tablet Take 1 tablet by mouth 2 (two) times daily for 7 days. 02/26/22 03/05/22 Yes Eulogio Bear, NP  ?ciprofloxacin-dexamethasone (CIPRODEX) OTIC suspension Place 4 drops into the left ear 2 (two) times daily. 02/26/22  Yes Eulogio Bear, NP  ?aspirin EC 81 MG tablet Take 1 tablet (81 mg total) by mouth  daily. 11/08/19   [name], MD  ?atorvastatin (LIPITOR) 80 MG tablet Take 80 mg by mouth daily.    [provider]  ?cetirizine (ZYRTEC) 5 MG tablet Take 5 mg by mouth daily.    [provider]  ?Cholecalciferol (VITAMIN D-3) 1000 units CAPS Take 1,000 Units by mouth daily.    [provider]  ?CYMBALTA 60 MG capsule Take 120 mg by mouth at bedtime. 05/21/12   [provider]  ?dicyclomine (BENTYL) 20 MG tablet Take 20 mg by mouth in the morning, at noon, in the evening, and at bedtime. 01/31/17   [provider]  ?ezetimibe (ZETIA) 10 MG tablet Take 1 tablet (10 mg total) by mouth daily. 11/23/20   [name], MD  ?famotidine (PEPCID) 20 MG tablet Take 20 mg by mouth 2 (two) times daily. 09/21/20   [provider]  ?fluticasone (FLONASE) 50 MCG/ACT nasal spray Place 2 sprays into both nostrils daily as needed for allergies or rhinitis.   12/13/13   [provider]  ?ibuprofen (ADVIL) 600 MG tablet Take 1 tablet (600 mg total) by mouth every 8 (eight) hours as needed. ?Patient taking differently: Take 600 mg by mouth every 8 (eight) hours as needed for mild pain or headache. 02/25/20   Jaynee Eagles, PA-C  ?lisinopril-hydrochlorothiazide (PRINZIDE,ZESTORETIC) 20-12.5 MG tablet Take 1 tablet by mouth daily. 04/26/17   Eugenie Filler, MD  ?lisinopril-hydrochlorothiazide (ZESTORETIC) 10-12.5 MG tablet Take 1 tablet by mouth daily at 12 noon. 12/04/20   [provider]  ?Magnesium 500 MG TABS Take 500 mg by mouth daily.    [provider]  ?Multiple Vitamin (MULTIVITAMIN) capsule Take 1 capsule by mouth every morning.     [provider]  ?pantoprazole (PROTONIX) 40 MG tablet Take 40 mg by mouth 2 (two) times daily. 02/10/17   [provider]  ?potassium chloride SA (KLOR-CON) 20 MEQ tablet Take 2 tablets (40 mEq total) by mouth 2 (two) times daily. ?Patient not taking: Reported on 02/26/2022 09/07/20   [name], MD  ? ? ?Family History ?Family History  ?Problem Relation Age of Onset  ? Allergies Mother   ? Heart disease Mother   ?     bypass surgery  ? Hypertension Mother   ? Hyperlipidemia Mother   ? Heart disease Father   ?     bypass surgery  ? Hypertension Father   ? Hyperlipidemia Father   ? Stroke Father   ? Diabetes Paternal Grandmother   ? Allergies Daughter   ? Allergies Son   ? Colon cancer Neg Hx   ? Esophageal cancer Neg Hx   ? Rectal cancer Neg Hx   ? Stomach cancer Neg Hx   ? ? ?Social History ?Social History  ? ?Tobacco Use  ? Smoking status: Never  ? Smokeless tobacco: Never  ?Vaping Use  ? Vaping Use: Never used  ?Substance Use Topics  ? Alcohol use: No  ? Drug use: No  ? ? ? ?Allergies   ?Patient has no known allergies. ? ? ?Review of Systems ?Review of Systems ?Per HPI ? ?Physical Exam ?Triage Vital Signs ?ED Triage Vitals  ?Enc Vitals Group  ?   BP 02/26/22 1417 127/79  ?   Pulse Rate  02/26/22 1417 83  ?   Resp 02/26/22 1417 18  ?   Temp 02/26/22 1417 99 ?F (37.2 ?C)  ?   Temp Source 02/26/22 1417 Oral  ?   SpO2  02/26/22 1417 94 %  ?   Weight --   ?   Height --   ?   Head Circumference --   ?   Peak Flow --   ?   Pain Score 02/26/22 1413 6  ?   Pain Loc --   ?   Pain Edu? --   ?   Excl. in [place]? --   ? ?No data found. ? ?Updated Vital Signs ?BP 127/79 (BP Location: Left Arm)   Pulse 83   Temp 99 ?F (37.2 ?C) (Oral)   Resp 18   SpO2 94%  ? ?Visual Acuity ?Right Eye Distance:   ?Left Eye Distance:   ?Bilateral Distance:   ? ?Right Eye Near:   ?Left Eye Near:    ?Bilateral Near:    ? ?Physical Exam ?Vitals and nursing note reviewed.  ?Constitutional:   ?   General: She is not in acute distress. ?   Appearance: Normal appearance. She is not toxic-appearing.  ?HENT:  ?   Head: Normocephalic and atraumatic.  ?   Right Ear: Hearing, tympanic membrane, ear canal and external ear normal.  ?   Left Ear: Tympanic membrane normal. Swelling and tenderness present. Tympanic membrane is not perforated or erythematous.  ?   Ears:  ?   Comments: External auditory canal of left ear swollen and painful to palpation ?   Nose: No congestion or rhinorrhea.  ?   Right Sinus: Maxillary sinus tenderness present. No frontal sinus tenderness.  ?   Left Sinus: Maxillary sinus tenderness present. No frontal sinus tenderness.  ?   Mouth/Throat:  ?   Mouth: Mucous membranes are moist.  ?   Pharynx: Oropharynx is clear. No posterior oropharyngeal erythema.  ?Eyes:  ?   General: No scleral icterus. ?   Extraocular Movements: Extraocular movements intact.  ?Cardiovascular:  ?   Rate and Rhythm: Normal rate and regular rhythm.  ?Pulmonary:  ?   Effort: Pulmonary effort is normal. No respiratory distress.  ?   Breath sounds: Normal breath sounds. No wheezing, rhonchi or rales.  ?Abdominal:  ?   General: Abdomen is flat. Bowel sounds are normal. There is no distension.  ?   Palpations: Abdomen is soft.  ?Musculoskeletal:  ?    Cervical back: Normal range of motion.  ?Lymphadenopathy:  ?   Cervical: No cervical adenopathy.  ?Skin: ?   General: Skin is warm and dry.  ?   Coloration: Skin is not jaundiced or pale.  ?   Findings: No erythema.  ?Neurologic

## 2022-02-26 NOTE — ED Triage Notes (Addendum)
2 weeks ago had a cold, sore throat and runny nose, a week later had diarrhea.  Recently has noticed blood pressure being elevated.  Intermittent sob.  Patient did not do a covid test during her illness.   ?Had 2 days of diarrhea this week, took imodium on Wednesday and has not had a bm since then ?At this point, has intermittent abdominal pain, random sob, fatigue.   ?Dr Caren Griffins white is patient's primary.   ?

## 2022-02-26 NOTE — Discharge Instructions (Addendum)
-   Please start the ear drops for the outer ear infection ?- Please also start the oral antibiotics to help with the sinus infection ?- Please continue with plenty of hydration with water ?- Follow up with your primary care provider if your symptoms persist despite the prescribed treatment. ?

## 2022-03-03 DIAGNOSIS — F419 Anxiety disorder, unspecified: Secondary | ICD-10-CM | POA: Diagnosis not present

## 2022-04-04 DIAGNOSIS — I700 Atherosclerosis of aorta: Secondary | ICD-10-CM | POA: Diagnosis not present

## 2022-04-04 DIAGNOSIS — L900 Lichen sclerosus et atrophicus: Secondary | ICD-10-CM | POA: Diagnosis not present

## 2022-04-04 DIAGNOSIS — R7303 Prediabetes: Secondary | ICD-10-CM | POA: Diagnosis not present

## 2022-04-04 DIAGNOSIS — F324 Major depressive disorder, single episode, in partial remission: Secondary | ICD-10-CM | POA: Diagnosis not present

## 2022-04-04 DIAGNOSIS — E785 Hyperlipidemia, unspecified: Secondary | ICD-10-CM | POA: Diagnosis not present

## 2022-04-04 DIAGNOSIS — M542 Cervicalgia: Secondary | ICD-10-CM | POA: Diagnosis not present

## 2022-04-04 DIAGNOSIS — I10 Essential (primary) hypertension: Secondary | ICD-10-CM | POA: Diagnosis not present

## 2022-04-04 DIAGNOSIS — K582 Mixed irritable bowel syndrome: Secondary | ICD-10-CM | POA: Diagnosis not present

## 2022-04-04 DIAGNOSIS — F419 Anxiety disorder, unspecified: Secondary | ICD-10-CM | POA: Diagnosis not present

## 2022-06-02 DIAGNOSIS — H5200 Hypermetropia, unspecified eye: Secondary | ICD-10-CM | POA: Diagnosis not present

## 2022-06-24 DIAGNOSIS — I10 Essential (primary) hypertension: Secondary | ICD-10-CM | POA: Diagnosis not present

## 2022-06-24 DIAGNOSIS — I999 Unspecified disorder of circulatory system: Secondary | ICD-10-CM | POA: Diagnosis not present

## 2022-07-16 ENCOUNTER — Other Ambulatory Visit: Payer: Self-pay

## 2022-07-16 ENCOUNTER — Ambulatory Visit
Admission: RE | Admit: 2022-07-16 | Discharge: 2022-07-16 | Disposition: A | Discharge status: Home / Self Care | Payer: Medicare HMO | Source: Ambulatory Visit | Attending: Family Medicine | Admitting: Family Medicine

## 2022-07-16 VITALS — BP 192/94 | HR 98 | Temp 98.0°F | Resp 18

## 2022-07-16 DIAGNOSIS — R3915 Urgency of urination: Secondary | ICD-10-CM

## 2022-07-16 DIAGNOSIS — R350 Frequency of micturition: Secondary | ICD-10-CM

## 2022-07-16 LAB — POCT URINALYSIS DIP (MANUAL ENTRY)
Bilirubin, UA: NEGATIVE
Blood, UA: NEGATIVE
Glucose, UA: NEGATIVE mg/dL
Ketones, POC UA: NEGATIVE mg/dL
Leukocytes, UA: NEGATIVE
Nitrite, UA: NEGATIVE
Protein Ur, POC: 30 mg/dL — AB
Spec Grav, UA: 1.015 (ref 1.010–1.025)
Urobilinogen, UA: 1 E.U./dL
pH, UA: 7 (ref 5.0–8.0)

## 2022-07-16 LAB — POCT FASTING CBG KUC MANUAL ENTRY: POCT Glucose (KUC): 108 mg/dL — AB (ref 70–99)

## 2022-07-16 MED ORDER — OXYBUTYNIN CHLORIDE ER 5 MG PO TB24: 5.0000 mg | ORAL_TABLET | Freq: Every day | ORAL | 0 refills | Status: DC

## 2022-07-16 NOTE — Discharge Instructions (Signed)
Your blood pressure was noted to be elevated during your visit today. If you are currently taking medication for high blood pressure, please ensure you are taking this as directed. If you do not have a history of high blood pressure and your blood pressure remains persistently elevated, you may need to begin taking a medication at some point. You may return here within the next few days to recheck if unable to see your primary care provider or if you do not have a one.  BP (!) 192/94 (BP Location: Right Arm)   Pulse 98   Temp 98 F (36.7 C) (Oral)   Resp 18   SpO2 93%   BP Readings from Last 3 Encounters:  07/16/22 (!) 192/94  02/26/22 127/79  03/17/21 (!) 162/101

## 2022-07-16 NOTE — ED Triage Notes (Addendum)
Pt reports urinary urgency, low back pain, and decreased urine output. Pt also reports cloudy urine and home UTI test was positive.

## 2022-07-20 NOTE — ED Provider Notes (Signed)
[place]    ASSESSMENT & PLAN:  1. Urinary urgency   2. Urinary frequency     Labs Reviewed  POCT URINALYSIS DIP (MANUAL ENTRY) - Abnormal; Notable for the following components:      Result Value   Protein Ur, POC =30 (*)    All other components within normal limits  POCT FASTING CBG KUC MANUAL ENTRY - Abnormal; Notable for the following components:   POCT Glucose (KUC) 108 (*)    All other components within normal limits   Nothing significant. Ques bladder spasm. Trial of: Meds ordered this encounter  Medications   oxybutynin (DITROPAN XL) 5 MG 24 hr tablet    Sig: Take 1 tablet (5 mg total) by mouth at bedtime.    Dispense:  30 tablet    Refill:  0    Will follow up with her PCP or here if not showing improvement.  Outlined signs and symptoms indicating need for more acute intervention. Patient verbalized understanding. After Visit Summary given.  SUBJECTIVE:  Debbie [name] is a 71 y.o. female who reports urinary urgency, low back discomfort. Ques decreased urine output. Describes sudden onset of urinary urgency; able to make it to toilet "but not much comes out"; no incontinence. Has stopped caffeine use. Without associated fever, chills, vaginal discharge or bleeding. Gross hematuria: not present. No specific aggravating or alleviating factors reported. No LE edema. Normal PO intake without n/v/d. Without specific abdominal pain. Ambulatory without difficulty. No tx PTA.  LMP: No LMP recorded. Patient has had a hysterectomy.  OBJECTIVE:  Vitals:   07/16/22 1045  BP: (!) 192/94  Pulse: 98  Resp: 18  Temp: 98 F (36.7 C)  TempSrc: Oral  SpO2: 93%   General appearance: alert; no distress HENT: oropharynx: moist Lungs: unlabored respirations Abdomen: soft, non-tender Skin: warm and dry Neurologic: normal gait Psychological: alert and cooperative; normal mood and affect  Labs Reviewed  POCT URINALYSIS DIP (MANUAL ENTRY) - Abnormal;  Notable for the following components:      Result Value   Protein Ur, POC =30 (*)    All other components within normal limits  POCT FASTING CBG KUC MANUAL ENTRY - Abnormal; Notable for the following components:   POCT Glucose (KUC) 108 (*)    All other components within normal limits    No Known Allergies  Past Medical History:  Diagnosis Date   Acid reflux    ALLERGIC RHINITIS    Anxiety    CAD (coronary artery disease), native coronary artery    coronary CTA 11/2019 with a calcium score of 146 and mild nonobstructive plaque in the mid LAD.   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[employer] [place]   Occupation: Scientist, research (medical): [employer]  Tobacco Use   Smoking status: Never   Smokeless tobacco: Never  Vaping Use   Vaping Use:  Never used  Substance and Sexual Activity   Alcohol use: No   Drug use: No   Sexual activity: Never  Other Topics Concern   Not on file  Social History Narrative   Not on file   Social Determinants of Health   Financial Resource Strain: Not on file  Food Insecurity: Not on file  Transportation Needs: Not on file  Physical Activity: Not on file  Stress: Not on file  Social Connections: Not on file  Intimate Partner Violence: Not on file   Family History  Problem Relation Age of Onset   Allergies Mother    Heart disease Mother        bypass surgery   Hypertension Mother    Hyperlipidemia Mother    Heart disease Father        bypass surgery   Hypertension Father     Hyperlipidemia Father    Stroke Father    Diabetes Paternal Grandmother    Allergies Daughter    Allergies Son    Colon cancer Neg Hx    Esophageal cancer Neg Hx    Rectal cancer Neg Hx    Stomach cancer Neg Hx         Vanessa Kick, MD 07/20/22 [phone]

## 2022-07-30 DIAGNOSIS — N390 Urinary tract infection, site not specified: Secondary | ICD-10-CM | POA: Diagnosis not present

## 2022-08-05 DIAGNOSIS — N3941 Urge incontinence: Secondary | ICD-10-CM | POA: Diagnosis not present

## 2022-08-05 DIAGNOSIS — R9431 Abnormal electrocardiogram [ECG] [EKG]: Secondary | ICD-10-CM | POA: Diagnosis not present

## 2022-08-05 DIAGNOSIS — R0602 Shortness of breath: Secondary | ICD-10-CM | POA: Diagnosis not present

## 2022-08-14 NOTE — Progress Notes (Signed)
Cardiology Office Note:    Debbie [name]:  08/18/2022   ID:  Debbie Debbie [name], DOB Oct 11, 1951, MRN [id number]  PCP:  Debbie Stains, MD   [facility] Providers Cardiologist:  Debbie Him, MD     Referring MD: Debbie Debbie [name]*   Chief Complaint: shortness of breath  History of Present Illness:    Debbie Debbie [name] is a very pleasant 71 y.o. female with a hx of HTN, HLD, family history of heart disease, abnormal EKG in 2019 showing normal sinus rhythm with diffuse T wave abnormality in anterior precordial leads and lateral leads.  Nuclear stress test in 2021 showed mild ST depression less than 1 mm at rest, low risk study.   Seen by Debbie Debbie [name] 09/2019 with dizziness and presyncope after standing up.  Blood pressure was low and lisinopril/HCTZ was cut back to 1 tablet daily.  She continued to have shortness of breath likely 2/2 deconditioning but due to her risk factors coronary CTA was performed.  CT 11/07/2019 revealed calcium score 146 (84th percentile for age/sex).  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  Aspirin was added.  Her last office visit was via telemedicine on 12/18/2019 with Debbie Barrios, PA at which time she reported no further dizziness.  BP was normal. One year follow-up was recommended.   Today, she is here alone for follow-up. Reports for the past 6 months she has noticed worsening DOE. Able to complete grocery shopping without stopping but is unable to unload all groceries up and down 7 stairs without resting. During the night, when she awakens to go urinate, she has difficulty getting her breathing calmed down after getting back into bed. No increase in # of pillows needed and no PND or edema. No presyncope, syncope. Describes chest pressure on occasion. No palpitations.   Past Medical History:  Diagnosis Debbie [name]   Acid reflux    ALLERGIC RHINITIS    Anxiety    CAD  (coronary artery disease), native coronary artery    coronary CTA 11/2019 with a calcium score of 146 and mild nonobstructive plaque in the mid LAD.   Depression    Diverticulosis    Dyspnea    with exertion    Esophageal stricture    Hiatal hernia    with  Schatzki's Ring   History of kidney stones    Hyperlipidemia    Hypertension    IBS (irritable bowel syndrome)     Past Surgical History:  Procedure Laterality Debbie [name]   ABDOMINAL HYSTERECTOMY     ANKLE FRACTURE SURGERY  2012   right   COLONOSCOPY  multiple   ESOPHAGOGASTRODUODENOSCOPY  09/14/05   multiple   KIDNEY STONE SURGERY     TONSILLECTOMY     TUBAL LIGATION     VESICOVAGINAL FISTULA CLOSURE W/ TAH     XI ROBOTIC ASSISTED HIATAL HERNIA REPAIR N/A 01/13/2021   Procedure: ROBOTIC ASSISTED NISSEN FUNDOPLICATION;  Surgeon: Debbie Morn, MD;  Location: [facility];  Service: General;  Laterality: N/A;    Current Medications: Current Meds  Medication Sig   aspirin EC 81 MG tablet Take 1 tablet (81 mg total) by mouth daily.   atorvastatin (LIPITOR) 80 MG tablet Take 80 mg by mouth daily.   cetirizine (ZYRTEC) 5 MG tablet Take 5 mg by mouth daily.   Cholecalciferol (VITAMIN D-3) 1000 units CAPS Take 1,000 Units by mouth daily.   ciprofloxacin-dexamethasone ([place]) OTIC  suspension Place 4 drops into the left ear 2 (two) times daily.   CYMBALTA 60 MG capsule Take 120 mg by mouth at bedtime.   dicyclomine (BENTYL) 20 MG tablet Take 20 mg by mouth in the morning, at noon, in the evening, and at bedtime.   ezetimibe (ZETIA) 10 MG tablet Take 1 tablet (10 mg total) by mouth daily.   famotidine (PEPCID) 20 MG tablet Take 20 mg by mouth 2 (two) times daily.   fluticasone (FLONASE) 50 MCG/ACT nasal spray Place 2 sprays into both nostrils daily as needed for allergies or rhinitis.    furosemide (LASIX) 20 MG tablet Take 1 tablet (20 mg total) by mouth daily.   ibuprofen (ADVIL) 600 MG tablet Take 1 tablet (600 mg total) by mouth  every 8 (eight) hours as needed. (Patient taking differently: Take 600 mg by mouth every 8 (eight) hours as needed for mild pain or headache.)   lisinopril (ZESTRIL) 10 MG tablet Take 10 mg by mouth daily.   Magnesium 500 MG TABS Take 500 mg by mouth daily.   Multiple Vitamin (MULTIVITAMIN) capsule Take 1 capsule by mouth every morning.    pantoprazole (PROTONIX) 40 MG tablet Take 40 mg by mouth 2 (two) times daily.   potassium chloride (KLOR-CON M) 10 MEQ tablet Take 1 tablet (10 mEq total) by mouth daily.   [DISCONTINUED] lisinopril-hydrochlorothiazide (ZESTORETIC) 10-12.5 MG tablet Take 1 tablet by mouth daily at 12 noon.   [DISCONTINUED] potassium chloride SA (KLOR-CON) 20 MEQ tablet Take 2 tablets (40 mEq total) by mouth 2 (two) times daily.     Allergies:   Patient has no known allergies.   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[employer]: [place]   Occupation: Scientist, research (medical): [place]  Tobacco Use   Smoking status: Never   Smokeless tobacco: Never  Scientific laboratory technician Use: Never used  Substance and Sexual Activity   Alcohol use: No   Drug use: No   Sexual activity: Never  Other Topics Concern   Not on file  Social History Narrative   Not on file   Social Determinants of Health   Financial Resource Strain: Not on file  Food Insecurity: Not on file  Transportation Needs: Not on file  Physical Activity: Not on file  Stress: Not on file  Social Connections: Not on file     Family History: The patient's family history includes Allergies in her daughter, mother, and son; Diabetes in her paternal grandmother; Heart disease in her father and mother; Hyperlipidemia in her father and mother; Hypertension in her father and mother;  Stroke in her father. There is no history of Colon cancer, Esophageal cancer, Rectal cancer, or Stomach cancer.  ROS:   Please see the history of present illness.    + DOE All other systems reviewed and are negative.  Labs/Other Studies Reviewed:    The following studies were reviewed today:  Lexiscan Myovview 09/16/20  <7m ST depression at rest, mild worsening with stress Nuclear stress EF: 55%. The left ventricular ejection fraction is normal (55-65%). The study is normal. This is a low risk study.   Echo 09/04/20   1. Left ventricular ejection  fraction, by estimation, is 60 to 65%. The  left ventricle has normal function. The left ventricle has no regional  wall motion abnormalities. There is mild concentric left ventricular  hypertrophy. Left ventricular diastolic  parameters are consistent with Grade I diastolic dysfunction (impaired  relaxation).   2. Right ventricular systolic function is normal. The right ventricular  size is normal. There is normal pulmonary artery systolic pressure.   3. The pericardial effusion is posterior to the left ventricle. There is  no evidence of cardiac tamponade.   4. The mitral valve is normal in structure. Trivial mitral valve  regurgitation.   5. The aortic valve is tricuspid. There is mild thickening of the aortic  valve. Aortic valve regurgitation is not visualized.   6. The inferior vena cava is normal in size with greater than 50%  respiratory variability, suggesting right atrial pressure of 3 mmHg.   Comparison(s): No significant change from prior study.   CCTA 11/07/19  1. Coronary calcium score of 146. This was 40 percentile for age and sex matched control.   2. 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 Right dominance.   3.  Mild non obstructive calcified plaque in the mid LAD   CAD-RADS 2. Mild  non-obstructive CAD (25-49%). Consider non-atherosclerotic causes of chest pain. Consider preventive therapy and risk factor modification.    Recent Labs: No results found for requested labs within last 365 days.  Recent Lipid Panel    Component Value Debbie [name]/Time   CHOL 125 11/10/2020 1117   TRIG 93 11/10/2020 1117   HDL 68 11/10/2020 1117   CHOLHDL 1.8 11/10/2020 1117   LDLCALC 40 11/10/2020 1117     Risk Assessment/Calculations:      Physical Exam:    VS:  BP 132/80   Pulse 94   Ht '5\' 3"'$  (1.6 m)   Wt 160 lb 12.8 oz (72.9 kg)   SpO2 94%   BMI 28.48 kg/m     Wt Readings from Last 3 Encounters:  08/18/22 160 lb 12.8 oz (72.9 kg)  01/13/21 186 lb 11.7 oz (84.7 kg)  09/16/20 165 lb (74.8 kg)     GEN:  Well nourished, well developed in no acute distress HEENT: Normal NECK: No JVD; No carotid bruits CARDIAC: RRR, no murmurs, rubs, gallops RESPIRATORY:  Clear to auscultation without rales, wheezing or rhonchi  ABDOMEN: Soft, non-tender, non-distended MUSCULOSKELETAL:  No edema; No deformity. 2+ pedal pulses, equal bilaterally SKIN: Warm and dry NEUROLOGIC:  Alert and oriented x 3 PSYCHIATRIC:  Normal affect   EKG:  EKG is ordered today.  The ekg ordered today demonstrates NSR at 94 bpm, nonspecific ST and T wave abnormality, no acute change from previous  Diagnoses:    1. Coronary artery disease due to lipid rich plaque   2. Essential hypertension   3. SOB (shortness of breath)   4. Hyperlipidemia LDL goal <70    Assessment and Plan:     DOE: Worsening shortness of breath over the past 6 months. Difficulty settling her breathing during the night when she gets up to urinate. No edema, orthopnea. Mild chest pressure at times. LVEF 60-65%, mild LVH, G1DD on echo 09/2020. No evidence of CAD on CCTA 11/2019 and low risk myoview 09/2020. We will get echocardiogram to evaluate for structural heart disease. Will add Lasix 20 mg daily to see if diuresis helps symptoms. Is  already on potassium.  Advised her to walk for exercise on a daily basis, gradually increasing time.  Advised her to notify us if symptoms worsen prior to follow-up. Emphasized the importance of good BP control.   Nonobstructive CAD: Coronary calcium score 146, 84th percentile for age/sex.  Mild nonobstructive calcified plaque in mid LAD (25 to 49%) on CCTA 11/2019.  Occasional chest pressure, likely associated with DOE.  Reviewed EKG findings of ST abnormality, stable in comparison to previous tracings. Low risk nuclear stress test 09/2020. Will evaluate echo for wall motion abnormality. No additional ischemia evaluation at this time. If echo is unrevealing, would favor   Hypertension: BP is well-controlled today. PCP increased lisinopril approximately one month ago, home BP running 130-140/70-80 mmHg. Previously on lisinopril-HCTZ. Adding diuretic as noted above.  It is not clear why HCTZ was stopped. Continue to monitor home BP. Continue lisinopril. We are adding Lasix as noted above.   Hyperlipidemia LDL goal < 70: LDL 43 on 05/17/22. Continue atorvastatin.      Disposition: 3 months with APP  Medication Adjustments/Labs and Tests Ordered: Current medicines are reviewed at length with the patient today.  Concerns regarding medicines are outlined above.  Orders Placed This Encounter  Procedures   Basic Metabolic Panel (BMET)   EKG 12-Lead   ECHOCARDIOGRAM COMPLETE   Meds ordered this encounter  Medications   furosemide (LASIX) 20 MG tablet    Sig: Take 1 tablet (20 mg total) by mouth daily.    Dispense:  30 tablet    Refill:  11   potassium chloride (KLOR-CON M) 10 MEQ tablet    Sig: Take 1 tablet (10 mEq total) by mouth daily.    Dispense:  30 tablet    Refill:  11    Patient Instructions  Medication Instructions:   START Lasix one ( 1) tablet by mouth ( 20 mg) daily.  START Kdur one (1 ) tablet by mouth ( 10 mEq) daily.   *If you need a refill on your cardiac medications  before your next appointment, please call your pharmacy*   Lab Work:  TODAY !!!! BMET  If you have labs (blood work) drawn today and your tests are completely normal, you will receive your results only by: [place] (if you have MyChart) OR A paper copy in the mail If you have any lab test that is abnormal or we need to change your treatment, we will call you to review the results.   Testing/Procedures:   Your physician has requested that you have an echocardiogram. Echocardiography is a painless test that uses sound waves to create images of your heart. It provides your doctor with information about the size and shape of your heart and how well your heart's chambers and valves are working. This procedure takes approximately one hour. There are no restrictions for this procedure.    Follow-Up: At [facility], you and your health needs are our priority.  As part of our continuing mission to provide you with exceptional heart care, we have created designated Provider Care Teams.  These Care Teams include your primary Cardiologist (physician) and Advanced Practice Providers (APPs -  Physician Assistants and Nurse Practitioners) who all work together to provide you with the care you need, when you need it.  We recommend signing up for the patient portal called "MyChart".  Sign up information is provided on this After Visit Summary.  MyChart is used to connect with patients for Virtual Visits (Telemedicine).  Patients are able to view  lab/test results, encounter notes, upcoming appointments, etc.  Non-urgent messages can be sent to your provider as well.   To learn more about what you can do with MyChart, go to [email or web address].    Your next appointment:   3 month(s)  The format for your next appointment:   In Person  Provider:   Christen Bame, NP         Other Instructions  Please check when you get home to make sure you are not on HCTZ anymore.    Important Information About Sugar         Signed, Emmaline Life, NP  08/18/2022 1:17 PM    [place]

## 2022-08-18 ENCOUNTER — Encounter: Payer: Self-pay | Admitting: Nurse Practitioner

## 2022-08-18 ENCOUNTER — Ambulatory Visit: Payer: Medicare HMO | Attending: Cardiology | Admitting: Nurse Practitioner

## 2022-08-18 VITALS — BP 132/80 | HR 94 | Ht 63.0 in | Wt 160.8 lb

## 2022-08-18 DIAGNOSIS — R0602 Shortness of breath: Secondary | ICD-10-CM | POA: Diagnosis not present

## 2022-08-18 DIAGNOSIS — I2583 Coronary atherosclerosis due to lipid rich plaque: Secondary | ICD-10-CM | POA: Diagnosis not present

## 2022-08-18 DIAGNOSIS — E785 Hyperlipidemia, unspecified: Secondary | ICD-10-CM | POA: Diagnosis not present

## 2022-08-18 DIAGNOSIS — I2510 Atherosclerotic heart disease of native coronary artery without angina pectoris: Secondary | ICD-10-CM | POA: Diagnosis not present

## 2022-08-18 DIAGNOSIS — I10 Essential (primary) hypertension: Secondary | ICD-10-CM | POA: Diagnosis not present

## 2022-08-18 LAB — BASIC METABOLIC PANEL
BUN/Creatinine Ratio: 17 (ref 12–28)
BUN: 13 mg/dL (ref 8–27)
CO2: 28 mmol/L (ref 20–29)
Calcium: 9.3 mg/dL (ref 8.7–10.3)
Chloride: 104 mmol/L (ref 96–106)
Creatinine, Ser: 0.75 mg/dL (ref 0.57–1.00)
Glucose: 93 mg/dL (ref 70–99)
Potassium: 4.8 mmol/L (ref 3.5–5.2)
Sodium: 145 mmol/L — ABNORMAL HIGH (ref 134–144)
eGFR: 86 mL/min/{1.73_m2} (ref >59)

## 2022-08-18 MED ORDER — FUROSEMIDE 20 MG PO TABS: 20.0000 mg | ORAL_TABLET | Freq: Every day | ORAL | 11 refills | Status: DC

## 2022-08-18 MED ORDER — POTASSIUM CHLORIDE CRYS ER 10 MEQ PO TBCR
10.0000 meq | EXTENDED_RELEASE_TABLET | Freq: Every day | ORAL | 11 refills | Status: DC
Start: 2022-08-18 — End: 2022-12-12

## 2022-08-18 NOTE — Patient Instructions (Addendum)
Medication Instructions:   START Lasix one ( 1) tablet by mouth ( 20 mg) daily.  START Kdur one (1 ) tablet by mouth ( 10 mEq) daily.   *If you need a refill on your cardiac medications before your next appointment, please call your pharmacy*   Lab Work:  TODAY !!!! BMET  If you have labs (blood work) drawn today and your tests are completely normal, you will receive your results only by: [place] (if you have MyChart) OR A paper copy in the mail If you have any lab test that is abnormal or we need to change your treatment, we will call you to review the results.   Testing/Procedures:   Your physician has requested that you have an echocardiogram. Echocardiography is a painless test that uses sound waves to create images of your heart. It provides your doctor with information about the size and shape of your heart and how well your heart's chambers and valves are working. This procedure takes approximately one hour. There are no restrictions for this procedure.    Follow-Up: At [facility], you and your health needs are our priority.  As part of our continuing mission to provide you with exceptional heart care, we have created designated Provider Care Teams.  These Care Teams include your primary Cardiologist (physician) and Advanced Practice Providers (APPs -  Physician Assistants and Nurse Practitioners) who all work together to provide you with the care you need, when you need it.  We recommend signing up for the patient portal called "MyChart".  Sign up information is provided on this After Visit Summary.  MyChart is used to connect with patients for Virtual Visits (Telemedicine).  Patients are able to view lab/test results, encounter notes, upcoming appointments, etc.  Non-urgent messages can be sent to your provider as well.   To learn more about what you can do with MyChart, go to [email or web address].    Your next appointment:   3 month(s)  The format  for your next appointment:   In Person  Provider:   Christen Bame, NP         Other Instructions  Please check when you get home to make sure you are not on HCTZ anymore.   Important Information About Sugar

## 2022-08-24 DIAGNOSIS — R350 Frequency of micturition: Secondary | ICD-10-CM | POA: Diagnosis not present

## 2022-08-24 DIAGNOSIS — F324 Major depressive disorder, single episode, in partial remission: Secondary | ICD-10-CM | POA: Diagnosis not present

## 2022-08-24 DIAGNOSIS — F419 Anxiety disorder, unspecified: Secondary | ICD-10-CM | POA: Diagnosis not present

## 2022-08-24 DIAGNOSIS — E785 Hyperlipidemia, unspecified: Secondary | ICD-10-CM | POA: Diagnosis not present

## 2022-08-24 DIAGNOSIS — I10 Essential (primary) hypertension: Secondary | ICD-10-CM | POA: Diagnosis not present

## 2022-08-24 DIAGNOSIS — I700 Atherosclerosis of aorta: Secondary | ICD-10-CM | POA: Diagnosis not present

## 2022-08-30 ENCOUNTER — Ambulatory Visit (HOSPITAL_COMMUNITY): Payer: Medicare HMO | Attending: Cardiology

## 2022-08-30 DIAGNOSIS — R0602 Shortness of breath: Secondary | ICD-10-CM | POA: Insufficient documentation

## 2022-08-30 DIAGNOSIS — E785 Hyperlipidemia, unspecified: Secondary | ICD-10-CM | POA: Diagnosis not present

## 2022-08-30 DIAGNOSIS — I2583 Coronary atherosclerosis due to lipid rich plaque: Secondary | ICD-10-CM | POA: Diagnosis not present

## 2022-08-30 DIAGNOSIS — I2510 Atherosclerotic heart disease of native coronary artery without angina pectoris: Secondary | ICD-10-CM | POA: Diagnosis not present

## 2022-08-30 DIAGNOSIS — I10 Essential (primary) hypertension: Secondary | ICD-10-CM | POA: Insufficient documentation

## 2022-08-30 LAB — ECHOCARDIOGRAM COMPLETE
Area-P 1/2: 3.66 cm2
S' Lateral: 4 cm

## 2022-08-31 ENCOUNTER — Other Ambulatory Visit: Payer: Self-pay | Admitting: *Deleted

## 2022-08-31 ENCOUNTER — Telehealth: Payer: Self-pay | Admitting: Nurse Practitioner

## 2022-08-31 DIAGNOSIS — I509 Heart failure, unspecified: Secondary | ICD-10-CM

## 2022-08-31 DIAGNOSIS — I2510 Atherosclerotic heart disease of native coronary artery without angina pectoris: Secondary | ICD-10-CM

## 2022-08-31 DIAGNOSIS — R0602 Shortness of breath: Secondary | ICD-10-CM

## 2022-08-31 DIAGNOSIS — I10 Essential (primary) hypertension: Secondary | ICD-10-CM

## 2022-08-31 MED ORDER — DAPAGLIFLOZIN PROPANEDIOL 10 MG PO TABS
10.0000 mg | ORAL_TABLET | Freq: Every day | ORAL | 6 refills | Status: DC
Start: 2022-08-31 — End: 2022-10-26

## 2022-08-31 NOTE — Telephone Encounter (Signed)
Follow Up:    Patient said she was returning Michelle's call, concerning her results.

## 2022-09-05 DIAGNOSIS — J019 Acute sinusitis, unspecified: Secondary | ICD-10-CM | POA: Diagnosis not present

## 2022-09-20 DIAGNOSIS — Z79899 Other long term (current) drug therapy: Secondary | ICD-10-CM | POA: Diagnosis not present

## 2022-09-20 DIAGNOSIS — K219 Gastro-esophageal reflux disease without esophagitis: Secondary | ICD-10-CM | POA: Diagnosis not present

## 2022-09-20 DIAGNOSIS — F324 Major depressive disorder, single episode, in partial remission: Secondary | ICD-10-CM | POA: Diagnosis not present

## 2022-09-20 DIAGNOSIS — Z23 Encounter for immunization: Secondary | ICD-10-CM | POA: Diagnosis not present

## 2022-09-20 DIAGNOSIS — E785 Hyperlipidemia, unspecified: Secondary | ICD-10-CM | POA: Diagnosis not present

## 2022-09-20 DIAGNOSIS — Z0000 Encounter for general adult medical examination without abnormal findings: Secondary | ICD-10-CM | POA: Diagnosis not present

## 2022-09-20 DIAGNOSIS — I700 Atherosclerosis of aorta: Secondary | ICD-10-CM | POA: Diagnosis not present

## 2022-09-20 DIAGNOSIS — I10 Essential (primary) hypertension: Secondary | ICD-10-CM | POA: Diagnosis not present

## 2022-09-20 DIAGNOSIS — R7303 Prediabetes: Secondary | ICD-10-CM | POA: Diagnosis not present

## 2022-09-20 DIAGNOSIS — E559 Vitamin D deficiency, unspecified: Secondary | ICD-10-CM | POA: Diagnosis not present

## 2022-09-20 DIAGNOSIS — N898 Other specified noninflammatory disorders of vagina: Secondary | ICD-10-CM | POA: Diagnosis not present

## 2022-09-21 ENCOUNTER — Other Ambulatory Visit: Payer: Self-pay | Admitting: Family Medicine

## 2022-09-21 DIAGNOSIS — E2839 Other primary ovarian failure: Secondary | ICD-10-CM

## 2022-09-22 ENCOUNTER — Other Ambulatory Visit: Payer: Self-pay | Admitting: Family Medicine

## 2022-09-22 DIAGNOSIS — Z1231 Encounter for screening mammogram for malignant neoplasm of breast: Secondary | ICD-10-CM

## 2022-09-22 DIAGNOSIS — R351 Nocturia: Secondary | ICD-10-CM | POA: Diagnosis not present

## 2022-09-22 DIAGNOSIS — R350 Frequency of micturition: Secondary | ICD-10-CM | POA: Diagnosis not present

## 2022-09-22 NOTE — Telephone Encounter (Signed)
Arabi Keep lab visit next week Discuss alternatives at f/u [place], PA-C    09/22/2022 12:56 PM

## 2022-09-28 ENCOUNTER — Ambulatory Visit: Payer: Medicare HMO | Attending: Cardiology

## 2022-09-28 DIAGNOSIS — I2510 Atherosclerotic heart disease of native coronary artery without angina pectoris: Secondary | ICD-10-CM | POA: Diagnosis not present

## 2022-09-28 DIAGNOSIS — I509 Heart failure, unspecified: Secondary | ICD-10-CM | POA: Diagnosis not present

## 2022-09-28 DIAGNOSIS — I10 Essential (primary) hypertension: Secondary | ICD-10-CM | POA: Diagnosis not present

## 2022-09-28 DIAGNOSIS — R0602 Shortness of breath: Secondary | ICD-10-CM

## 2022-09-28 DIAGNOSIS — I2583 Coronary atherosclerosis due to lipid rich plaque: Secondary | ICD-10-CM | POA: Diagnosis not present

## 2022-09-29 LAB — BASIC METABOLIC PANEL
BUN/Creatinine Ratio: 14 (ref 12–28)
BUN: 10 mg/dL (ref 8–27)
CO2: 27 mmol/L (ref 20–29)
Calcium: 9.2 mg/dL (ref 8.7–10.3)
Chloride: 102 mmol/L (ref 96–106)
Creatinine, Ser: 0.73 mg/dL (ref 0.57–1.00)
Glucose: 101 mg/dL — ABNORMAL HIGH (ref 70–99)
Potassium: 4 mmol/L (ref 3.5–5.2)
Sodium: 143 mmol/L (ref 134–144)
eGFR: 89 mL/min/{1.73_m2} (ref >59)

## 2022-10-05 ENCOUNTER — Ambulatory Visit: Payer: Medicare HMO | Admitting: Podiatry

## 2022-10-06 ENCOUNTER — Encounter: Payer: Self-pay | Admitting: Podiatry

## 2022-10-06 ENCOUNTER — Ambulatory Visit: Payer: Medicare HMO | Admitting: Podiatry

## 2022-10-06 DIAGNOSIS — I7300 Raynaud's syndrome without gangrene: Secondary | ICD-10-CM

## 2022-10-06 DIAGNOSIS — B351 Tinea unguium: Secondary | ICD-10-CM | POA: Diagnosis not present

## 2022-10-06 DIAGNOSIS — K219 Gastro-esophageal reflux disease without esophagitis: Secondary | ICD-10-CM | POA: Diagnosis not present

## 2022-10-06 DIAGNOSIS — M7751 Other enthesopathy of right foot: Secondary | ICD-10-CM | POA: Diagnosis not present

## 2022-10-06 DIAGNOSIS — R194 Change in bowel habit: Secondary | ICD-10-CM | POA: Diagnosis not present

## 2022-10-06 DIAGNOSIS — K581 Irritable bowel syndrome with constipation: Secondary | ICD-10-CM | POA: Diagnosis not present

## 2022-10-06 NOTE — Progress Notes (Signed)
Subjective:   Patient ID: Debbie [name], female   DOB: 71 y.o.   MRN: [id number]   HPI Patient presents with a number of different problems with 1 being brittleness of the nailbeds 1-5 both feet distal dorsal discoloration of the digits of both feet and pain in the forefoot right over left foot.  Patient does not smoke tries to be active   Review of Systems  All other systems reviewed and are negative.       Objective:  Physical Exam Vitals and nursing note reviewed.  Constitutional:      Appearance: She is well-developed.  Pulmonary:     Effort: Pulmonary effort is normal.  Musculoskeletal:        General: Normal range of motion.  Skin:    General: Skin is warm.  Neurological:     Mental Status: She is alert.     Neurovascular status intact muscle strength adequate range of motion adequate with patient found to have moderate red discoloration of the distal foot both feet with irritation around the toes and thin nailbeds bilateral along with inflammation pain around the third MPJ right spreading into the second and fourth bone structure.  Good digital perfusion toes are slightly cold well-oriented     Assessment:  Probability for Raynaud's phenomena along with brittle made nail disease and inflammatory capsulitis right over left     Plan:  H&P reviewed all conditions and the only thing I see I can help with this the capsulitis I did do sterile prep I injected the third and fourth MPJs periarticular I advised on elevation we discussed Raynaud's and the importance of avoiding cold exposure.  I educated her to a high degree on this and things to look for do not recommend treatment for nails

## 2022-10-06 NOTE — Patient Instructions (Signed)

## 2022-10-09 NOTE — Progress Notes (Deleted)
Cardiology Office Note:    Date:  10/09/2022   ID:  Nazanin Kinner, DOB 16-Jan-1951, MRN [id number]  PCP:  Harlan Stains, MD   [facility] Providers Cardiologist:  Fransico Him, MD     Referring MD: Harlan Stains, MD   Chief Complaint: shortness of breath  History of Present Illness:    Debbie [name] is a very pleasant 71 y.o. female with a hx of HTN, HLD, family history of heart disease, abnormal EKG in 2019 showing normal sinus rhythm with diffuse T wave abnormality in anterior precordial leads and lateral leads.  Nuclear stress test in 2021 showed mild ST depression less than 1 mm at rest, low risk study.   Seen by Dr. Radford Pax 09/2019 with dizziness and presyncope after standing up.  Blood pressure was low and lisinopril/HCTZ was cut back to 1 tablet daily.  She continued to have shortness of breath likely 2/2 deconditioning but due to her risk factors coronary CTA was performed.  CT 11/07/2019 revealed calcium score 146 (84th percentile for age/sex).  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  Aspirin was added.  Her last office visit was via telemedicine on 12/18/2019 with Ermalinda Barrios, PA at which time she reported no further dizziness.  BP was normal. One year follow-up was recommended.   Seen by me on 08/18/22 and reported increased DOE over the previous 6 months. Able to complete grocery shopping without stopping but unable to unload all groceries up and down 7 stairs without resting. Difficulty getting her breath when going to urinate during the night  No increase in # of pillows needed and no PND or edema. No presyncope, syncope. Described chest pressure on occasion. No palpitations. 2D echo 08/30/22 revealed mildly reduced LV function 45 to 50% which was a decline from normal function on previous echo 09/2020. Started Farxiga 10 mg daily with stable follow-up bmet on 09/28/22.    Today, she is here   Past Medical History:  Diagnosis Date   Acid reflux    ALLERGIC RHINITIS    Anxiety    CAD (coronary artery disease), native coronary artery    coronary CTA 11/2019 with a calcium score of 146 and mild nonobstructive plaque in the mid LAD.   Depression    Diverticulosis    Dyspnea    with exertion    Esophageal stricture    Hiatal hernia    with  Schatzki's Ring   History of kidney stones    Hyperlipidemia    Hypertension    IBS (irritable bowel syndrome)     Past Surgical History:  Procedure Laterality Date   ABDOMINAL HYSTERECTOMY     ANKLE FRACTURE SURGERY  2012   right   COLONOSCOPY  multiple   ESOPHAGOGASTRODUODENOSCOPY  09/14/05   multiple   KIDNEY STONE SURGERY     TONSILLECTOMY     TUBAL LIGATION     VESICOVAGINAL FISTULA CLOSURE W/ TAH     XI ROBOTIC ASSISTED HIATAL HERNIA REPAIR N/A 01/13/2021   Procedure: ROBOTIC ASSISTED NISSEN FUNDOPLICATION;  Surgeon: Felicie Morn, MD;  Location: [facility];  Service: General;  Laterality: N/A;    Current Medications: No outpatient medications have been marked as taking for the 10/11/22 encounter (Appointment) with Ann Maki Lanice Schwab, NP.     Allergies:   Patient has no known allergies.   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[employer]: [place]   Occupation: Scientist, research (medical): [employer]  Tobacco Use   Smoking status: Never   Smokeless tobacco: Never  Scientific laboratory technician Use: Never used  Substance and Sexual Activity   Alcohol use: No   Drug use: No   Sexual activity: Never  Other Topics Concern   Not on file  Social History Narrative   Not on file   Social Determinants of Health   Financial Resource Strain: Not on file  Food Insecurity:  Not on file  Transportation Needs: Not on file  Physical Activity: Not on file  Stress: Not on file  Social Connections: Not on file     Family History: The patient's family history includes Allergies in her daughter, mother, and son; Diabetes in her paternal grandmother; Heart disease in her father and mother; Hyperlipidemia in her father and mother; Hypertension in her father and mother; Stroke in her father. There is no history of Colon cancer, Esophageal cancer, Rectal cancer, or Stomach cancer.  ROS:   Please see the history of present illness.    *** All other systems reviewed and are negative.  Labs/Other Studies Reviewed:    The following studies were reviewed today:  Lexiscan Myovview 09/16/20  <78m ST depression at rest, mild worsening with stress Nuclear stress EF: 55%. The left ventricular ejection fraction is normal (55-65%). The study is normal. This is a low risk study.   Echo 09/04/20   1. Left ventricular ejection fraction, by estimation, is 60 to 65%. The  left ventricle has normal function. The left ventricle has no regional  wall motion abnormalities. There is mild concentric left ventricular  hypertrophy. Left ventricular diastolic  parameters are consistent with Grade I diastolic dysfunction (impaired  relaxation).   2. Right ventricular systolic function is normal. The right ventricular  size is normal. There is normal pulmonary artery systolic pressure.   3. The pericardial effusion is posterior to the left ventricle. There is  no evidence of cardiac tamponade.   4. The mitral valve is normal in structure. Trivial mitral valve  regurgitation.   5. The aortic valve is tricuspid. There is mild thickening of the aortic  valve. Aortic valve regurgitation is not visualized.   6. The inferior vena cava is normal in size with greater than 50%  respiratory variability, suggesting right atrial pressure of 3 mmHg.   Comparison(s): No significant change from  prior study.   CCTA 11/07/19  1. Coronary calcium score of 146. This was 874percentile for age and sex matched control.   2. 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 Right dominance.   3.  Mild non obstructive calcified plaque in the mid LAD   CAD-RADS 2. Mild non-obstructive CAD (25-49%). Consider non-atherosclerotic causes of chest pain. Consider preventive therapy and risk factor modification.    Recent Labs: 09/28/2022: BUN 10; Creatinine, Ser 0.73; Potassium 4.0; Sodium 143  Recent Lipid Panel    Component Value Date/Time   CHOL 125 11/10/2020 1117   TRIG 93 11/10/2020 1117   HDL 68 11/10/2020 1117   CHOLHDL 1.8 11/10/2020 1117  [place] 40 11/10/2020 1117     Risk Assessment/Calculations:      Physical Exam:    VS:  There were no vitals taken for this visit.    Wt Readings from Last 3 Encounters:  08/18/22 160 lb 12.8 oz (72.9 kg)  01/13/21 186 lb 11.7 oz (84.7 kg)  09/16/20 165 lb (74.8 kg)     GEN:  Well nourished, well developed in no acute distress HEENT: Normal NECK: No JVD; No carotid bruits CARDIAC: RRR, no murmurs, rubs, gallops RESPIRATORY:  Clear to auscultation without rales, wheezing or rhonchi  ABDOMEN: Soft, non-tender, non-distended MUSCULOSKELETAL:  No edema; No deformity. 2+ pedal pulses, equal bilaterally SKIN: Warm and dry NEUROLOGIC:  Alert and oriented x 3 PSYCHIATRIC:  Normal affect   EKG:  EKG is ***  Diagnoses:    No diagnosis found.  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 Started on Farxiga 10 mg daily. Follow-up lab with stable kidney function 10/25.   DOE: Worsening shortness of breath over the past 6 months. Difficulty settling her breathing during the night when she gets up to urinate. No edema, orthopnea. Mild chest pressure at times. LVEF  60-65%, mild LVH, G1DD on echo 09/2020. No evidence of CAD on CCTA 11/2019 and low risk myoview 09/2020. We will get echocardiogram to evaluate for structural heart disease. Will add Lasix 20 mg daily to see if diuresis helps symptoms. Is already on potassium. Advised her to walk for exercise on a daily basis, gradually increasing time.  Advised her to notify us if symptoms worsen prior to follow-up. Emphasized the importance of good BP control.   Nonobstructive CAD: Coronary calcium score 146, 84th percentile for age/sex.  Mild nonobstructive calcified plaque in mid LAD (25 to 49%) on CCTA 11/2019.  Occasional chest pressure, likely associated with DOE.  Reviewed EKG findings of ST abnormality, stable in comparison to previous tracings. Low risk nuclear stress test 09/2020. Will evaluate echo for wall motion abnormality. No additional ischemia evaluation at this time. If echo is unrevealing, would favor   Hypertension: BP is well-controlled today. PCP increased lisinopril approximately one month ago, home BP running 130-140/70-80 mmHg. Previously on lisinopril-HCTZ. Adding diuretic as noted above.  It is not clear why HCTZ was stopped. Continue to monitor home BP. Continue lisinopril. We are adding Lasix as noted above.   Hyperlipidemia LDL goal < 70: LDL 43 on 05/17/22. Continue atorvastatin.      Disposition: ***  Medication Adjustments/Labs and Tests Ordered: Current medicines are reviewed at length with the patient today.  Concerns regarding medicines are outlined above.  No orders of the defined types were placed in this encounter.  No orders of the defined types were placed in this encounter.   There are no Patient Instructions on file for this visit.   Signed, Emmaline Life, NP  10/09/2022 8:08 AM    [place]

## 2022-10-10 ENCOUNTER — Ambulatory Visit: Payer: Medicare HMO | Admitting: Cardiology

## 2022-10-11 ENCOUNTER — Ambulatory Visit: Payer: Medicare HMO | Admitting: Nurse Practitioner

## 2022-10-12 DIAGNOSIS — N9089 Other specified noninflammatory disorders of vulva and perineum: Secondary | ICD-10-CM | POA: Diagnosis not present

## 2022-10-12 DIAGNOSIS — R3915 Urgency of urination: Secondary | ICD-10-CM | POA: Diagnosis not present

## 2022-10-12 DIAGNOSIS — R109 Unspecified abdominal pain: Secondary | ICD-10-CM | POA: Diagnosis not present

## 2022-10-18 DIAGNOSIS — N764 Abscess of vulva: Secondary | ICD-10-CM | POA: Diagnosis not present

## 2022-10-24 NOTE — Progress Notes (Unsigned)
Cardiology Office Note:    Date:  10/27/2022   ID:  Debbie [name], DOB 1951-09-02, MRN [id number]  PCP:  Harlan Stains, MD   [facility] Providers Cardiologist:  Fransico Him, MD     Referring MD: Harlan Stains, MD   Chief Complaint: shortness of breath  History of Present Illness:    Debbie [name] is a very pleasant 71 y.o. female with a hx of HTN, HLD, family history of heart disease, abnormal EKG in 2019 showing normal sinus rhythm with diffuse T wave abnormality in anterior precordial leads and lateral leads.  Nuclear stress test in 2021 showed mild ST depression less than 1 mm at rest, low risk study.   Seen by Dr. Radford Pax 09/2019 with dizziness and presyncope after standing up.  Blood pressure was low and lisinopril/HCTZ was cut back to 1 tablet daily.  She continued to have shortness of breath likely 2/2 deconditioning but due to her risk factors coronary CTA was performed.  CT 11/07/2019 revealed calcium score 146 (84th percentile for age/sex).  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  Aspirin was added.  Her last office visit was via telemedicine on 12/18/2019 with Ermalinda Barrios, PA at which time she reported no further dizziness.  BP was normal. One year follow-up was recommended.   Seen by me on 08/18/22 and reported increased DOE over the previous 6 months. Able to complete grocery shopping without stopping but unable to unload all groceries up and down 7 stairs without resting. Difficulty getting her breath when going to urinate during the night  No increase in # of pillows needed and no PND or edema. No presyncope, syncope. Described chest pressure on occasion. No palpitations. 2D echo 08/30/22 revealed mildly reduced LV function 45 to 50% which was a decline from normal function on previous echo 09/2020. Started Farxiga 10 mg daily with stable follow-up bmet on  09/28/22.   Today, she is here for follow-up. Unfortunately she had to stop [place] because of yeast infection. Was feeling better while taking it. Reports shortness of breath has improved but is most noticeable when she lies down, it is difficult to get comfortable but then she is able to sleep and does not experience PND. Chest pain only occurs occasionally when walking a long distance or with more activity than normal. Has less chest pain than in the past and fatigue has improved.  Walks 30 minutes daily, admits she is not walking at a very fast pace. She denies lower extremity edema, palpitations, diaphoresis, weakness, presyncope, syncope.  Past Medical History:  Diagnosis Date   Acid reflux    ALLERGIC RHINITIS    Anxiety    CAD (coronary artery disease), native coronary artery    coronary CTA 11/2019 with a calcium score of 146 and mild nonobstructive plaque in the mid LAD.   Depression    Diverticulosis    Dyspnea    with exertion    Esophageal stricture    Hiatal hernia    with  Schatzki's Ring   History of kidney stones    Hyperlipidemia    Hypertension    IBS (irritable bowel syndrome)     Past Surgical History:  Procedure Laterality Date   ABDOMINAL HYSTERECTOMY     ANKLE FRACTURE SURGERY  2012   right   COLONOSCOPY  multiple   ESOPHAGOGASTRODUODENOSCOPY  09/14/05   multiple   KIDNEY STONE SURGERY  TONSILLECTOMY     TUBAL LIGATION     VESICOVAGINAL FISTULA CLOSURE W/ TAH     XI ROBOTIC ASSISTED HIATAL HERNIA REPAIR N/A 01/13/2021   Procedure: ROBOTIC ASSISTED NISSEN FUNDOPLICATION;  Surgeon: Felicie Morn, MD;  Location: [facility];  Service: General;  Laterality: N/A;    Current Medications: Current Meds  Medication Sig   aspirin EC 81 MG tablet Take 1 tablet (81 mg total) by mouth daily.   atorvastatin (LIPITOR) 80 MG tablet Take 80 mg by mouth daily.   cetirizine (ZYRTEC) 5 MG tablet Take 5 mg by mouth daily.   Cholecalciferol (VITAMIN D-3) 1000 units  CAPS Take 1,000 Units by mouth daily.   CYMBALTA 60 MG capsule Take 120 mg by mouth at bedtime.   ezetimibe (ZETIA) 10 MG tablet Take 1 tablet (10 mg total) by mouth daily.   famotidine (PEPCID) 20 MG tablet Take 20 mg by mouth 2 (two) times daily.   fluticasone (FLONASE) 50 MCG/ACT nasal spray Place 2 sprays into both nostrils daily as needed for allergies or rhinitis.    furosemide (LASIX) 20 MG tablet Take 1 tablet (20 mg total) by mouth daily.   ibuprofen (ADVIL) 600 MG tablet Take 1 tablet (600 mg total) by mouth every 8 (eight) hours as needed. (Patient taking differently: Take 600 mg by mouth every 8 (eight) hours as needed for mild pain or headache.)   lisinopril (ZESTRIL) 10 MG tablet Take 10 mg by mouth daily.   Magnesium 500 MG TABS Take 500 mg by mouth daily.   Multiple Vitamin (MULTIVITAMIN) capsule Take 1 capsule by mouth every morning.    pantoprazole (PROTONIX) 40 MG tablet Take 40 mg by mouth 2 (two) times daily.   potassium chloride (KLOR-CON M) 10 MEQ tablet Take 1 tablet (10 mEq total) by mouth daily.   spironolactone (ALDACTONE) 25 MG tablet Take 1 tablet (25 mg total) by mouth daily.     Allergies:   Patient has no known allergies.   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, research (medical): [place]  Tobacco Use   Smoking status: Never   Smokeless tobacco: Never  Scientific laboratory technician Use: Never used  Substance and Sexual Activity   Alcohol use: No   Drug use: No   Sexual activity: Never  Other Topics Concern   Not on file  Social History Narrative   Not on file   Social Determinants of Health   Financial Resource Strain: Not on file  Food Insecurity: Not on file  Transportation  Needs: Not on file  Physical Activity: Not on file  Stress: Not on file  Social Connections: Not on file     Family History: The patient's family history includes Allergies in her daughter, mother, and son; Diabetes in her paternal grandmother; Heart disease in her father and mother; Hyperlipidemia in her father and mother; Hypertension in her father and mother; Stroke in her father. There is no history of Colon cancer, Esophageal cancer, Rectal cancer, or Stomach cancer.  ROS:   Please see the history of present illness.    + DOE All other systems reviewed and are negative.  Labs/Other Studies Reviewed:    The following studies were reviewed today:  Lexiscan Myovview 09/16/20  <79m ST depression at rest, mild worsening with stress Nuclear stress EF: 55%. The left ventricular ejection fraction is normal (55-65%). The study is normal. This is a low risk study.   Echo 09/04/20   1. Left ventricular ejection fraction, by estimation, is 60 to 65%. The  left ventricle has normal function. The left ventricle has no regional  wall motion abnormalities. There is mild concentric left ventricular  hypertrophy. Left ventricular diastolic  parameters are consistent with Grade I diastolic dysfunction (impaired  relaxation).   2. Right ventricular systolic function is normal. The right ventricular  size is normal. There is normal pulmonary artery systolic pressure.   3. The pericardial effusion is posterior to the left ventricle. There is  no evidence of cardiac tamponade.   4. The mitral valve is normal in structure. Trivial mitral valve  regurgitation.   5. The aortic valve is tricuspid. There is mild thickening of the aortic  valve. Aortic valve regurgitation is not visualized.   6. The inferior vena cava is normal in size with greater than 50%  respiratory variability, suggesting right atrial pressure of 3 mmHg.   Comparison(s): No significant change from prior study.   CCTA  11/07/19  1. Coronary calcium score of 146. This was 874percentile for age and sex matched control.   2. 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 Right dominance.   3.  Mild non obstructive calcified plaque in the mid LAD   CAD-RADS 2. Mild non-obstructive CAD (25-49%). Consider non-atherosclerotic causes of chest pain. Consider preventive therapy and risk factor modification.    Recent Labs: 09/28/2022: BUN 10; Creatinine, Ser 0.73; Potassium 4.0; Sodium 143  Recent Lipid Panel    Component Value Date/Time   CHOL 125 11/10/2020 1117   TRIG 93 11/10/2020 1117   HDL 68 11/10/2020 1117   CHOLHDL 1.8 11/10/2020 1117   LDLCALC 40 11/10/2020 1117     Risk Assessment/Calculations:      Physical Exam:    VS:  BP 124/86   Pulse 97   Ht '5\' 3"'$  (1.6 m)   Wt 158 lb 3.2 oz (71.8 kg)   SpO2 90%   BMI 28.02 kg/m     Wt Readings from Last 3 Encounters:  10/26/22 158 lb 3.2 oz (71.8 kg)  08/18/22 160 lb 12.8 oz (72.9 kg)  01/13/21 186 lb 11.7 oz (84.7 kg)     GEN:  Well nourished, well developed in no acute distress HEENT: Normal NECK: No JVD; No carotid bruits CARDIAC: RRR, no murmurs, rubs, gallops RESPIRATORY:  Clear to auscultation without rales, wheezing or rhonchi  ABDOMEN: Soft, non-tender, non-distended MUSCULOSKELETAL:  No edema; No deformity. 2+ pedal pulses, equal bilaterally SKIN: Warm and dry NEUROLOGIC:  Alert and oriented x 3 PSYCHIATRIC:  Normal affect   EKG:  EKG is not ordered today  Diagnoses:    1. Heart failure with mildly reduced ejection fraction ([place]   2. Coronary artery disease due to lipid rich plaque   3. DOE (dyspnea on exertion)   4. 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 Could not tolerate FIran No edema, palpitations, PND. Has  some orthopnea that hse feels is stable. It improved on  [place]. Scr and potassium reviewed today is stable. Will start spironolactone 25 mg daily and check bmet in one week.  Continue Lasix, lisinopril.   DOE: Symptoms have improved since last time I saw her. Mild orthopnea and chest pressure at times, likely 2/2 mildly reduced EF. No edema, PND. As noted above, will up titrate GDMT for HFmrEF.   Nonobstructive CAD: Coronary calcium score 146, 84th percentile for age/sex.  Mild nonobstructive calcified plaque in mid LAD (25 to 49%) on CCTA 11/2019. Low risk nuclear stress test 09/2020. DOE that she feels has improved. Walks frequently for exercise. Advised her to increase intensity of walking and to notify us if symptoms of DOE worsen or if she develops chest pain.   Hypertension: BP is well-controlled today. PCP increased lisinopril approximately one month ago, home BP running 130-140/70-80 mmHg. Previously on lisinopril-HCTZ. Adding diuretic as noted above.  It is not clear why HCTZ was stopped. Continue to monitor home BP. Continue lisinopril. We are adding Lasix as noted above.   Hyperlipidemia LDL goal < 70: LDL 43 on 05/17/22. Continue atorvastatin.      Disposition: 4 months with Dr. Radford Pax  Medication Adjustments/Labs and Tests Ordered: Current medicines are reviewed at length with the patient today.  Concerns regarding medicines are outlined above.  Orders Placed This Encounter  Procedures   Basic Metabolic Panel (BMET)   Meds ordered this encounter  Medications   spironolactone (ALDACTONE) 25 MG tablet    Sig: Take 1 tablet (25 mg total) by mouth daily.    Dispense:  30 tablet    Refill:  11    Patient Instructions  Medication Instructions:   START Aldactone one (1) tablet by mouth ( 25 mg) daily.  *If you need a refill on your cardiac medications before your next appointment, please call your pharmacy*   Lab Work:  Your physician recommends that you return for lab work  on Friday, December 1. You can come in on the day of your appointment anytime between 7:30-4:30.   If you have labs (blood work) drawn today and your tests are completely normal, you will receive your results only by: [place] (if you have MyChart) OR A paper copy in the mail If you have any lab test that is abnormal or we need to change your treatment, we will call you to review the results.   Testing/Procedures:  None ordered.   Follow-Up: At [facility], you and your health needs are our priority.  As part of our continuing mission to provide you with exceptional heart care, we have created designated Provider Care Teams.  These Care Teams include your primary Cardiologist (physician) and Advanced Practice Providers (APPs -  Physician Assistants and Nurse Practitioners) who all work together to provide you with the care you need, when you need it.  We recommend signing up for the patient portal called "MyChart".  Sign up information is provided on this After Visit Summary.  MyChart is used to connect with patients for Virtual Visits (Telemedicine).  Patients are able to view lab/test results, encounter notes, upcoming appointments, etc.  Non-urgent messages can be sent to your provider as well.   To learn more about what you can do with MyChart, go to [email or web address].    Your next appointment:   4 month(s)  The format for your next appointment:   In Person  Provider:   Fransico Him, MD     Important Information About Sugar  Signed, Emmaline Life, NP  10/27/2022 8:26 AM    [place]

## 2022-10-26 ENCOUNTER — Ambulatory Visit: Payer: Medicare HMO | Attending: Nurse Practitioner | Admitting: Nurse Practitioner

## 2022-10-26 ENCOUNTER — Encounter: Payer: Self-pay | Admitting: Nurse Practitioner

## 2022-10-26 VITALS — BP 124/86 | HR 97 | Ht 63.0 in | Wt 158.2 lb

## 2022-10-26 DIAGNOSIS — R0602 Shortness of breath: Secondary | ICD-10-CM

## 2022-10-26 DIAGNOSIS — I509 Heart failure, unspecified: Secondary | ICD-10-CM

## 2022-10-26 DIAGNOSIS — I2583 Coronary atherosclerosis due to lipid rich plaque: Secondary | ICD-10-CM

## 2022-10-26 DIAGNOSIS — I2510 Atherosclerotic heart disease of native coronary artery without angina pectoris: Secondary | ICD-10-CM

## 2022-10-26 DIAGNOSIS — E785 Hyperlipidemia, unspecified: Secondary | ICD-10-CM

## 2022-10-26 DIAGNOSIS — R0609 Other forms of dyspnea: Secondary | ICD-10-CM | POA: Diagnosis not present

## 2022-10-26 DIAGNOSIS — I5022 Chronic systolic (congestive) heart failure: Secondary | ICD-10-CM | POA: Diagnosis not present

## 2022-10-26 DIAGNOSIS — I10 Essential (primary) hypertension: Secondary | ICD-10-CM

## 2022-10-26 MED ORDER — SPIRONOLACTONE 25 MG PO TABS: 25.0000 mg | ORAL_TABLET | Freq: Every day | ORAL | 11 refills | Status: DC

## 2022-10-26 NOTE — Patient Instructions (Signed)
Medication Instructions:   START Aldactone one (1) tablet by mouth ( 25 mg) daily.  *If you need a refill on your cardiac medications before your next appointment, please call your pharmacy*   Lab Work:  Your physician recommends that you return for lab work on Friday, December 1. You can come in on the day of your appointment anytime between 7:30-4:30.   If you have labs (blood work) drawn today and your tests are completely normal, you will receive your results only by: [place] (if you have MyChart) OR A paper copy in the mail If you have any lab test that is abnormal or we need to change your treatment, we will call you to review the results.   Testing/Procedures:  None ordered.   Follow-Up: At [facility], you and your health needs are our priority.  As part of our continuing mission to provide you with exceptional heart care, we have created designated Provider Care Teams.  These Care Teams include your primary Cardiologist (physician) and Advanced Practice Providers (APPs -  Physician Assistants and Nurse Practitioners) who all work together to provide you with the care you need, when you need it.  We recommend signing up for the patient portal called "MyChart".  Sign up information is provided on this After Visit Summary.  MyChart is used to connect with patients for Virtual Visits (Telemedicine).  Patients are able to view lab/test results, encounter notes, upcoming appointments, etc.  Non-urgent messages can be sent to your provider as well.   To learn more about what you can do with MyChart, go to [email or web address].    Your next appointment:   4 month(s)  The format for your next appointment:   In Person  Provider:   Fransico Him, MD     Important Information About Sugar

## 2022-10-27 ENCOUNTER — Encounter: Payer: Self-pay | Admitting: Nurse Practitioner

## 2022-11-02 DIAGNOSIS — K3189 Other diseases of stomach and duodenum: Secondary | ICD-10-CM | POA: Diagnosis not present

## 2022-11-02 DIAGNOSIS — K5730 Diverticulosis of large intestine without perforation or abscess without bleeding: Secondary | ICD-10-CM | POA: Diagnosis not present

## 2022-11-02 DIAGNOSIS — K2930 Chronic superficial gastritis without bleeding: Secondary | ICD-10-CM | POA: Diagnosis not present

## 2022-11-02 DIAGNOSIS — Z9889 Other specified postprocedural states: Secondary | ICD-10-CM | POA: Diagnosis not present

## 2022-11-02 DIAGNOSIS — Q399 Congenital malformation of esophagus, unspecified: Secondary | ICD-10-CM | POA: Diagnosis not present

## 2022-11-02 DIAGNOSIS — K449 Diaphragmatic hernia without obstruction or gangrene: Secondary | ICD-10-CM | POA: Diagnosis not present

## 2022-11-02 DIAGNOSIS — K219 Gastro-esophageal reflux disease without esophagitis: Secondary | ICD-10-CM | POA: Diagnosis not present

## 2022-11-02 DIAGNOSIS — R194 Change in bowel habit: Secondary | ICD-10-CM | POA: Diagnosis not present

## 2022-11-02 DIAGNOSIS — K648 Other hemorrhoids: Secondary | ICD-10-CM | POA: Diagnosis not present

## 2022-11-03 DIAGNOSIS — R350 Frequency of micturition: Secondary | ICD-10-CM | POA: Diagnosis not present

## 2022-11-03 DIAGNOSIS — R351 Nocturia: Secondary | ICD-10-CM | POA: Diagnosis not present

## 2022-11-04 ENCOUNTER — Ambulatory Visit: Payer: Medicare HMO | Attending: Nurse Practitioner

## 2022-11-04 DIAGNOSIS — I5022 Chronic systolic (congestive) heart failure: Secondary | ICD-10-CM | POA: Diagnosis not present

## 2022-11-04 DIAGNOSIS — I2510 Atherosclerotic heart disease of native coronary artery without angina pectoris: Secondary | ICD-10-CM

## 2022-11-04 DIAGNOSIS — I2583 Coronary atherosclerosis due to lipid rich plaque: Secondary | ICD-10-CM | POA: Diagnosis not present

## 2022-11-04 DIAGNOSIS — R0609 Other forms of dyspnea: Secondary | ICD-10-CM

## 2022-11-04 DIAGNOSIS — I10 Essential (primary) hypertension: Secondary | ICD-10-CM | POA: Diagnosis not present

## 2022-11-04 LAB — BASIC METABOLIC PANEL
BUN/Creatinine Ratio: 16 (ref 12–28)
BUN: 13 mg/dL (ref 8–27)
CO2: 28 mmol/L (ref 20–29)
Calcium: 9.2 mg/dL (ref 8.7–10.3)
Chloride: 102 mmol/L (ref 96–106)
Creatinine, Ser: 0.8 mg/dL (ref 0.57–1.00)
Glucose: 95 mg/dL (ref 70–99)
Potassium: 4.1 mmol/L (ref 3.5–5.2)
Sodium: 144 mmol/L (ref 134–144)
eGFR: 80 mL/min/{1.73_m2} (ref >59)

## 2022-11-08 DIAGNOSIS — K2930 Chronic superficial gastritis without bleeding: Secondary | ICD-10-CM | POA: Diagnosis not present

## 2022-11-11 ENCOUNTER — Ambulatory Visit: Payer: Medicare HMO | Admitting: Nurse Practitioner

## 2022-11-14 ENCOUNTER — Ambulatory Visit: Payer: Medicare HMO

## 2022-11-15 ENCOUNTER — Other Ambulatory Visit (HOSPITAL_COMMUNITY): Payer: Self-pay | Admitting: Gastroenterology

## 2022-11-15 DIAGNOSIS — R112 Nausea with vomiting, unspecified: Secondary | ICD-10-CM

## 2022-11-15 DIAGNOSIS — R194 Change in bowel habit: Secondary | ICD-10-CM

## 2022-11-18 ENCOUNTER — Other Ambulatory Visit: Payer: Self-pay | Admitting: Gastroenterology

## 2022-11-18 ENCOUNTER — Other Ambulatory Visit: Payer: Medicare HMO

## 2022-11-18 DIAGNOSIS — R1031 Right lower quadrant pain: Secondary | ICD-10-CM | POA: Diagnosis not present

## 2022-11-18 DIAGNOSIS — R42 Dizziness and giddiness: Secondary | ICD-10-CM | POA: Diagnosis not present

## 2022-11-18 DIAGNOSIS — I959 Hypotension, unspecified: Secondary | ICD-10-CM | POA: Diagnosis not present

## 2022-11-18 DIAGNOSIS — K5900 Constipation, unspecified: Secondary | ICD-10-CM | POA: Diagnosis not present

## 2022-11-24 ENCOUNTER — Ambulatory Visit
Admission: RE | Admit: 2022-11-24 | Discharge: 2022-11-24 | Disposition: A | Discharge status: Home / Self Care | Payer: Medicare HMO | Source: Ambulatory Visit | Attending: Gastroenterology | Admitting: Gastroenterology

## 2022-11-24 DIAGNOSIS — R1031 Right lower quadrant pain: Secondary | ICD-10-CM

## 2022-11-24 DIAGNOSIS — K5730 Diverticulosis of large intestine without perforation or abscess without bleeding: Secondary | ICD-10-CM | POA: Diagnosis not present

## 2022-11-24 DIAGNOSIS — K6289 Other specified diseases of anus and rectum: Secondary | ICD-10-CM | POA: Diagnosis not present

## 2022-11-24 DIAGNOSIS — K5900 Constipation, unspecified: Secondary | ICD-10-CM | POA: Diagnosis not present

## 2022-11-24 DIAGNOSIS — K449 Diaphragmatic hernia without obstruction or gangrene: Secondary | ICD-10-CM | POA: Diagnosis not present

## 2022-11-24 MED ORDER — IOPAMIDOL (ISOVUE-300) INJECTION 61%
100.0000 mL | Freq: Once | INTRAVENOUS | Status: AC | PRN
  Administered 2022-11-24: 100 mL via INTRAVENOUS

## 2022-11-25 DIAGNOSIS — R350 Frequency of micturition: Secondary | ICD-10-CM | POA: Diagnosis not present

## 2022-11-25 DIAGNOSIS — D414 Neoplasm of uncertain behavior of bladder: Secondary | ICD-10-CM | POA: Diagnosis not present

## 2022-11-30 ENCOUNTER — Ambulatory Visit
Admission: RE | Admit: 2022-11-30 | Discharge: 2022-11-30 | Disposition: A | Discharge status: Home / Self Care | Payer: Medicare HMO | Source: Ambulatory Visit | Attending: Family Medicine | Admitting: Family Medicine

## 2022-11-30 DIAGNOSIS — Z1231 Encounter for screening mammogram for malignant neoplasm of breast: Secondary | ICD-10-CM

## 2022-12-01 ENCOUNTER — Encounter (HOSPITAL_COMMUNITY)
Admission: RE | Admit: 2022-12-01 | Discharge: 2022-12-01 | Disposition: A | Discharge status: Home / Self Care | Payer: Medicare HMO | Source: Ambulatory Visit | Attending: Gastroenterology | Admitting: Gastroenterology

## 2022-12-01 DIAGNOSIS — R194 Change in bowel habit: Secondary | ICD-10-CM | POA: Insufficient documentation

## 2022-12-01 DIAGNOSIS — R6881 Early satiety: Secondary | ICD-10-CM | POA: Diagnosis not present

## 2022-12-01 DIAGNOSIS — R112 Nausea with vomiting, unspecified: Secondary | ICD-10-CM | POA: Insufficient documentation

## 2022-12-01 DIAGNOSIS — R140 Abdominal distension (gaseous): Secondary | ICD-10-CM | POA: Diagnosis not present

## 2022-12-01 MED ORDER — TECHNETIUM TC 99M SULFUR COLLOID
2.2000 | Freq: Once | INTRAVENOUS | Status: AC
  Administered 2022-12-01: 2.2 via INTRAVENOUS

## 2022-12-10 ENCOUNTER — Observation Stay (HOSPITAL_COMMUNITY)
Admission: EM | Admit: 2022-12-10 | Discharge: 2022-12-12 | Disposition: A | Discharge status: Home / Self Care | Payer: Medicare HMO | Attending: Internal Medicine | Admitting: Internal Medicine

## 2022-12-10 DIAGNOSIS — R2681 Unsteadiness on feet: Secondary | ICD-10-CM | POA: Diagnosis not present

## 2022-12-10 DIAGNOSIS — Z6828 Body mass index (BMI) 28.0-28.9, adult: Secondary | ICD-10-CM | POA: Diagnosis not present

## 2022-12-10 DIAGNOSIS — R197 Diarrhea, unspecified: Secondary | ICD-10-CM | POA: Diagnosis not present

## 2022-12-10 DIAGNOSIS — I110 Hypertensive heart disease with heart failure: Secondary | ICD-10-CM | POA: Insufficient documentation

## 2022-12-10 DIAGNOSIS — Z7982 Long term (current) use of aspirin: Secondary | ICD-10-CM | POA: Diagnosis not present

## 2022-12-10 DIAGNOSIS — R7309 Other abnormal glucose: Secondary | ICD-10-CM

## 2022-12-10 DIAGNOSIS — R109 Unspecified abdominal pain: Secondary | ICD-10-CM | POA: Insufficient documentation

## 2022-12-10 DIAGNOSIS — I2510 Atherosclerotic heart disease of native coronary artery without angina pectoris: Secondary | ICD-10-CM | POA: Diagnosis not present

## 2022-12-10 DIAGNOSIS — I951 Orthostatic hypotension: Principal | ICD-10-CM | POA: Diagnosis present

## 2022-12-10 DIAGNOSIS — M6281 Muscle weakness (generalized): Secondary | ICD-10-CM | POA: Diagnosis not present

## 2022-12-10 DIAGNOSIS — R55 Syncope and collapse: Secondary | ICD-10-CM | POA: Diagnosis not present

## 2022-12-10 DIAGNOSIS — K5900 Constipation, unspecified: Secondary | ICD-10-CM | POA: Diagnosis not present

## 2022-12-10 DIAGNOSIS — R269 Unspecified abnormalities of gait and mobility: Secondary | ICD-10-CM | POA: Diagnosis not present

## 2022-12-10 DIAGNOSIS — R7402 Elevation of levels of lactic acid dehydrogenase (LDH): Secondary | ICD-10-CM | POA: Insufficient documentation

## 2022-12-10 DIAGNOSIS — I5020 Unspecified systolic (congestive) heart failure: Secondary | ICD-10-CM | POA: Insufficient documentation

## 2022-12-10 DIAGNOSIS — Z79899 Other long term (current) drug therapy: Secondary | ICD-10-CM | POA: Insufficient documentation

## 2022-12-10 DIAGNOSIS — W19XXXA Unspecified fall, initial encounter: Secondary | ICD-10-CM | POA: Diagnosis not present

## 2022-12-10 DIAGNOSIS — R42 Dizziness and giddiness: Secondary | ICD-10-CM | POA: Diagnosis not present

## 2022-12-10 DIAGNOSIS — R7989 Other specified abnormal findings of blood chemistry: Secondary | ICD-10-CM

## 2022-12-10 DIAGNOSIS — R110 Nausea: Secondary | ICD-10-CM | POA: Diagnosis not present

## 2022-12-11 ENCOUNTER — Inpatient Hospital Stay (HOSPITAL_COMMUNITY): Payer: Medicare HMO

## 2022-12-11 ENCOUNTER — Other Ambulatory Visit: Payer: Self-pay

## 2022-12-11 DIAGNOSIS — R55 Syncope and collapse: Secondary | ICD-10-CM

## 2022-12-11 DIAGNOSIS — I951 Orthostatic hypotension: Secondary | ICD-10-CM | POA: Diagnosis not present

## 2022-12-11 DIAGNOSIS — J9811 Atelectasis: Secondary | ICD-10-CM | POA: Diagnosis not present

## 2022-12-11 DIAGNOSIS — E861 Hypovolemia: Secondary | ICD-10-CM

## 2022-12-11 LAB — URINALYSIS, COMPLETE (UACMP) WITH MICROSCOPIC
Bilirubin Urine: NEGATIVE
Glucose, UA: NEGATIVE mg/dL
Hgb urine dipstick: NEGATIVE
Ketones, ur: NEGATIVE mg/dL
Nitrite: NEGATIVE
Protein, ur: NEGATIVE mg/dL
Specific Gravity, Urine: 1.01 (ref 1.005–1.030)
pH: 7 (ref 5.0–8.0)

## 2022-12-11 LAB — RESPIRATORY PANEL BY PCR

## 2022-12-11 LAB — CBC WITH DIFFERENTIAL/PLATELET
Abs Immature Granulocytes: 0.03 10*3/uL (ref 0.00–0.07)
Basophils Absolute: 0 10*3/uL (ref 0.0–0.1)
Basophils Relative: 0 %
Eosinophils Absolute: 0.2 10*3/uL (ref 0.0–0.5)
Eosinophils Relative: 2 %
HCT: 44.1 % (ref 36.0–46.0)
Hemoglobin: 14 g/dL (ref 12.0–15.0)
Immature Granulocytes: 0 %
Lymphocytes Relative: 22 %
Lymphs Abs: 2.6 10*3/uL (ref 0.7–4.0)
MCH: 27.9 pg (ref 26.0–34.0)
MCHC: 31.7 g/dL (ref 30.0–36.0)
MCV: 87.8 fL (ref 80.0–100.0)
Monocytes Absolute: 1 10*3/uL (ref 0.1–1.0)
Monocytes Relative: 8 %
Neutro Abs: 7.9 10*3/uL — ABNORMAL HIGH (ref 1.7–7.7)
Neutrophils Relative %: 68 %
Platelets: 358 10*3/uL (ref 150–400)
RBC: 5.02 MIL/uL (ref 3.87–5.11)
RDW: 14.5 % (ref 11.5–15.5)
WBC: 11.7 10*3/uL — ABNORMAL HIGH (ref 4.0–10.5)
nRBC: 0 % (ref 0.0–0.2)

## 2022-12-11 LAB — CBC
HCT: 38.5 % (ref 36.0–46.0)
Hemoglobin: 12.2 g/dL (ref 12.0–15.0)
MCH: 27.5 pg (ref 26.0–34.0)
MCHC: 31.7 g/dL (ref 30.0–36.0)
MCV: 86.9 fL (ref 80.0–100.0)
Platelets: 305 10*3/uL (ref 150–400)
RBC: 4.43 MIL/uL (ref 3.87–5.11)
RDW: 14.4 % (ref 11.5–15.5)
WBC: 8.9 10*3/uL (ref 4.0–10.5)
nRBC: 0 % (ref 0.0–0.2)

## 2022-12-11 LAB — CREATININE, SERUM
Creatinine, Ser: 0.93 mg/dL (ref 0.44–1.00)
GFR, Estimated: >60 mL/min (ref >60)

## 2022-12-11 LAB — BASIC METABOLIC PANEL
Anion gap: 10 (ref 5–15)
BUN: 19 mg/dL (ref 8–23)
CO2: 29 mmol/L (ref 22–32)
Calcium: 8.3 mg/dL — ABNORMAL LOW (ref 8.9–10.3)
Chloride: 98 mmol/L (ref 98–111)
Creatinine, Ser: 0.97 mg/dL (ref 0.44–1.00)
GFR, Estimated: >60 mL/min (ref >60)
Glucose, Bld: 116 mg/dL — ABNORMAL HIGH (ref 70–99)
Potassium: 3.8 mmol/L (ref 3.5–5.1)
Sodium: 137 mmol/L (ref 135–145)

## 2022-12-11 LAB — CBG MONITORING, ED: Glucose-Capillary: 98 mg/dL (ref 70–99)

## 2022-12-11 LAB — D-DIMER, QUANTITATIVE: D-Dimer, Quant: 1.03 ug/mL-FEU — ABNORMAL HIGH (ref 0.00–0.50)

## 2022-12-11 LAB — ECHOCARDIOGRAM COMPLETE
AR max vel: 2.6 cm2
AV Area VTI: 2.61 cm2
AV Area mean vel: 2.63 cm2
AV Mean grad: 3 mmHg
AV Peak grad: 6.7 mmHg
Ao pk vel: 1.29 m/s
Area-P 1/2: 2.62 cm2
Calc EF: 47.8 %
Height: 62 in
S' Lateral: 3.5 cm
Single Plane A2C EF: 51 %
Single Plane A4C EF: 46.8 %
Weight: 2464 oz

## 2022-12-11 LAB — HIV ANTIBODY (ROUTINE TESTING W REFLEX): HIV Screen 4th Generation wRfx: NONREACTIVE

## 2022-12-11 LAB — LACTIC ACID, PLASMA
Lactic Acid, Venous: 3.1 mmol/L (ref 0.5–1.9)
Lactic Acid, Venous: 0.7 mmol/L (ref 0.5–1.9)
Lactic Acid, Venous: 1.5 mmol/L (ref 0.5–1.9)

## 2022-12-11 LAB — TROPONIN I (HIGH SENSITIVITY)
Troponin I (High Sensitivity): 5 ng/L (ref <18)
Troponin I (High Sensitivity): 5 ng/L (ref <18)

## 2022-12-11 LAB — CORTISOL: Cortisol, Plasma: 3.7 ug/dL

## 2022-12-11 MED ORDER — ADULT MULTIVITAMIN W/MINERALS CH
1.0000 | ORAL_TABLET | Freq: Every morning | ORAL | Status: DC
  Administered 2022-12-11 – 2022-12-12 (×2): 1 via ORAL
  Filled 2022-12-11 (×2): qty 1

## 2022-12-11 MED ORDER — HEPARIN SODIUM (PORCINE) 5000 UNIT/ML IJ SOLN
5000.0000 [IU] | Freq: Three times a day (TID) | INTRAMUSCULAR | Status: DC
  Administered 2022-12-11 – 2022-12-12 (×4): 5000 [IU] via SUBCUTANEOUS
  Filled 2022-12-11 (×4): qty 1

## 2022-12-11 MED ORDER — SODIUM CHLORIDE 0.9% FLUSH
3.0000 mL | Freq: Two times a day (BID) | INTRAVENOUS | Status: DC
  Administered 2022-12-11 (×2): 3 mL via INTRAVENOUS

## 2022-12-11 MED ORDER — LIDOCAINE 5 % EX PTCH
1.0000 | MEDICATED_PATCH | CUTANEOUS | Status: DC
  Administered 2022-12-11 – 2022-12-12 (×2): 1 via TRANSDERMAL
  Filled 2022-12-11 (×2): qty 1

## 2022-12-11 MED ORDER — PANTOPRAZOLE SODIUM 40 MG PO TBEC
40.0000 mg | DELAYED_RELEASE_TABLET | Freq: Two times a day (BID) | ORAL | Status: DC
  Administered 2022-12-11 – 2022-12-12 (×3): 40 mg via ORAL
  Filled 2022-12-11 (×3): qty 1

## 2022-12-11 MED ORDER — DULOXETINE HCL 60 MG PO CPEP
120.0000 mg | ORAL_CAPSULE | Freq: Every day | ORAL | Status: DC
  Administered 2022-12-11: 120 mg via ORAL
  Filled 2022-12-11: qty 2

## 2022-12-11 MED ORDER — ATORVASTATIN CALCIUM 80 MG PO TABS
80.0000 mg | ORAL_TABLET | Freq: Every day | ORAL | Status: DC
  Administered 2022-12-11 – 2022-12-12 (×2): 80 mg via ORAL
  Filled 2022-12-11: qty 1
  Filled 2022-12-11: qty 2

## 2022-12-11 MED ORDER — LACTATED RINGERS IV SOLN: INTRAVENOUS | Status: DC

## 2022-12-11 MED ORDER — SODIUM CHLORIDE 0.9 % IV SOLN: INTRAVENOUS | Status: AC

## 2022-12-11 MED ORDER — ASPIRIN 81 MG PO TBEC
81.0000 mg | DELAYED_RELEASE_TABLET | Freq: Every day | ORAL | Status: DC
  Administered 2022-12-11 – 2022-12-12 (×2): 81 mg via ORAL
  Filled 2022-12-11 (×2): qty 1

## 2022-12-11 MED ORDER — LACTATED RINGERS IV BOLUS
1000.0000 mL | Freq: Once | INTRAVENOUS | Status: AC
  Administered 2022-12-11: 1000 mL via INTRAVENOUS

## 2022-12-11 MED ORDER — EZETIMIBE 10 MG PO TABS
10.0000 mg | ORAL_TABLET | Freq: Every day | ORAL | Status: DC
  Administered 2022-12-11 – 2022-12-12 (×2): 10 mg via ORAL
  Filled 2022-12-11 (×2): qty 1

## 2022-12-11 NOTE — Consult Note (Signed)
Cardiology Consultation   Patient ID: Debbie [name] MRN: [id number]; DOB: 05-31-1951  Admit date: 12/10/2022 Date of Consult: 12/11/2022  PCP:  Harlan Stains, MD   [place] Prov[facility] Cardiologist:  Fransico Him, MD     Patient Profile:   Debbie [name] is a 72 y.o. female with a hx of HTN, IBS (constipation), HFpEF who is being seen 12/11/2022 for the evaluation of syncope at the request of Dr Nevada Crane.  History of Present Illness:   Ms. Zuidema presented today with syncope. She reports 2 weeks of progressively worsening malaise. Poor PO intake. Significant volume diarrhea that is 2/2 her new IBS medication. Her daughter who is in the room confirms poor PO and significant volume loss. She reports standing up from a seated position and then the room going dark. She could hear someone talking but she could move or respond. She then lost consciousness completely. She is feeling better already with IV hydration.    Past Medical History:  Diagnosis Date   Acid reflux    ALLERGIC RHINITIS    Anxiety    CAD (coronary artery disease), native coronary artery    coronary CTA 11/2019 with a calcium score of 146 and mild nonobstructive plaque in the mid LAD.   Depression    Diverticulosis    Dyspnea    with exertion    Esophageal stricture    Hiatal hernia    with  Schatzki's Ring   History of kidney stones    Hyperlipidemia    Hypertension    IBS (irritable bowel syndrome)     Past Surgical History:  Procedure Laterality Date   ABDOMINAL HYSTERECTOMY     ANKLE FRACTURE SURGERY  2012   right   COLONOSCOPY  multiple   ESOPHAGOGASTRODUODENOSCOPY  09/14/05   multiple   KIDNEY STONE SURGERY     TONSILLECTOMY     TUBAL LIGATION     VESICOVAGINAL FISTULA CLOSURE W/ TAH     XI ROBOTIC ASSISTED HIATAL HERNIA REPAIR N/A 01/13/2021   Procedure: ROBOTIC ASSISTED NISSEN FUNDOPLICATION;  Surgeon: Felicie Morn, MD;  Location: [facility];  Service: General;   Laterality: N/A;       Inpatient Medications: Scheduled Meds:  aspirin EC  81 mg Oral Daily   atorvastatin  80 mg Oral Daily   DULoxetine  120 mg Oral QHS   ezetimibe  10 mg Oral Daily   heparin  5,000 Units Subcutaneous Q8H   lidocaine  1 patch Transdermal Q24H   multivitamin with minerals  1 tablet Oral q morning   pantoprazole  40 mg Oral BID   sodium chloride flush  3 mL Intravenous Q12H   Continuous Infusions:  lactated ringers     PRN Meds:   Allergies:   No Known Allergies  Social History:   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[employer]: [employer]   Occupation: Scientist, research (medical): [employer]  Tobacco Use   Smoking status: Never   Smokeless tobacco: Never  Vaping Use   Vaping Use: Never used  Substance and Sexual Activity   Alcohol use: No   Drug use: No   Sexual activity: Never  Other Topics Concern  Not on file  Social History Narrative   Not on file   Social Determinants of Health   Financial Resource Strain: Not on file  Food Insecurity: Not on file  Transportation Needs: Not on file  Physical Activity: Not on file  Stress: Not on file  Social Connections: Not on file  Intimate Partner Violence: Not on file    Family History:    Family History  Problem Relation Age of Onset   Allergies Mother    Heart disease Mother        bypass surgery   Hypertension Mother    Hyperlipidemia Mother    Heart disease Father        bypass surgery   Hypertension Father    Hyperlipidemia Father    Stroke Father    Diabetes Paternal Grandmother    Allergies Daughter    Allergies Son    Colon cancer Neg Hx    Esophageal cancer Neg Hx    Rectal cancer Neg Hx    Stomach cancer Neg Hx      ROS:  Please see the  history of present illness.   All other ROS reviewed and negative.     Physical Exam/Data:   Vitals:   12/11/22 1000 12/11/22 1129 12/11/22 1245 12/11/22 1542  BP: 108/70  128/63   Pulse: 73  80   Resp: 14  20   Temp:  98.1 F (36.7 C)  97.9 F (36.6 C)  TempSrc:  Oral  Oral  SpO2: 96%  91%   Weight:      Height:        Intake/Output Summary (Last 24 hours) at 12/11/2022 1620 Last data filed at 12/11/2022 0253 Gross per 24 hour  Intake 2000 ml  Output --  Net 2000 ml      12/11/2022   12:03 AM 10/26/2022   12:59 PM 08/18/2022   10:03 AM  Last 3 Weights  Weight (lbs) 154 lb 158 lb 3.2 oz 160 lb 12.8 oz  Weight (kg) 69.854 kg 71.759 kg 72.938 kg     Body mass index is 28.17 kg/m.  General:  Well nourished, well developed, in no acute distress laying flat in bed HEENT: normal Neck: no JVD Vascular: No carotid bruits; Distal pulses 2+ bilaterally Cardiac:  normal S1, S2; RRR; no murmur  Lungs:  clear to auscultation bilaterally, no wheezing, rhonchi or rales  Abd: soft, nontender, no hepatomegaly  Ext: no edema Musculoskeletal:  No deformities, BUE and BLE strength normal and equal Skin: warm and dry  Neuro:  CNs 2-12 intact, no focal abnormalities noted Psych:  Normal affect   EKG:  The EKG was personally reviewed and demonstrates:  sinus  Telemetry:  Telemetry was personally reviewed and demonstrates:  sinus  Relevant CV Studies:  12/11/2022 echo - EF 50-55. RV normal. No MR.   Laboratory Data:  High Sensitivity Troponin:   Recent Labs  Lab 12/11/22 0002 12/11/22 0400  TROPONINIHS 5 5     Chemistry Recent Labs  Lab 12/11/22 0002 12/11/22 0400  NA 137  --   K 3.8  --   CL 98  --   CO2 29  --   GLUCOSE 116*  --   BUN 19  --   CREATININE 0.97 0.93  CALCIUM 8.3*  --   GFRNONAA >60 >60  ANIONGAP 10  --     No results for input(s): "PROT", "ALBUMIN", "AST", "ALT", "ALKPHOS", "BILITOT" in the last 168 hours. Lipids No results  for input(s): "CHOL",  "TRIG", "HDL", "LABVLDL", "LDLCALC", "CHOLHDL" in the last 168 hours.  Hematology Recent Labs  Lab 12/11/22 0002 12/11/22 0400  WBC 11.7* 8.9  RBC 5.02 4.43  HGB 14.0 12.2  HCT 44.1 38.5  MCV 87.8 86.9  MCH 27.9 27.5  MCHC 31.7 31.7  RDW 14.5 14.4  PLT 358 305   Thyroid No results for input(s): "TSH", "FREET4" in the last 168 hours.  BNPNo results for input(s): "BNP", "PROBNP" in the last 168 hours.  DDimer  Recent Labs  Lab 12/11/22 0002  DDIMER 1.03*     Radiology/Studies:  ECHOCARDIOGRAM COMPLETE  Result Date: 12/11/2022    ECHOCARDIOGRAM REPORT   Patient Name:   Faigy Stretch Date of Exam: 12/11/2022 Medical Rec #:  [id number]            Height:       62.0 in Accession #:    [id number]           Weight:       154.0 lb Date of Birth:  [birth year]             BSA:          43.711 m Patient Age:    4 years             BP:           123/53 mmHg Patient Gender: F                    HR:           79 bpm. Exam Location:  Inpatient Procedure: 2D Echo, Cardiac Doppler and Color Doppler Indications:    Syncope  History:        Patient has prior history of Echocardiogram examinations, most                 recent 08/30/2022. CAD; Risk Factors:Hypertension and                 Dyslipidemia.  Sonographer:    Clayton Lefort RDCS (AE) Referring Phys: Clance Boll  Sonographer Comments: Suboptimal subcostal window. IMPRESSIONS  1. Left ventricular ejection fraction, by estimation, is 50 to 55%. The left ventricle has low normal function. Left ventricular endocardial border not optimally defined to evaluate regional wall motion. Left ventricular diastolic parameters are consistent with Grade I diastolic dysfunction (impaired relaxation).  2. Right ventricular systolic function was not well visualized. The right ventricular size is normal. Tricuspid regurgitation signal is inadequate for assessing PA pressure.  3. The mitral valve is degenerative. No evidence of mitral valve regurgitation. No evidence  of mitral stenosis.  4. The aortic valve is tricuspid. There is mild calcification of the aortic valve. Aortic valve regurgitation is not visualized. No aortic stenosis is present. Comparison(s): No significant change from prior study. Prior images reviewed side by side. FINDINGS  Left Ventricle: Left ventricular ejection fraction, by estimation, is 50 to 55%. The left ventricle has low normal function. Left ventricular endocardial border not optimally defined to evaluate regional wall motion. The left ventricular internal cavity  size was normal in size. There is no left ventricular hypertrophy. Left ventricular diastolic parameters are consistent with Grade I diastolic dysfunction (impaired relaxation). Right Ventricle: The right ventricular size is normal. No increase in right ventricular wall thickness. Right ventricular systolic function was not well visualized. Tricuspid regurgitation signal is inadequate for assessing PA pressure. Left Atrium: Left atrial size was normal in size. Right  Atrium: Right atrial size was normal in size. Pericardium: There is no evidence of pericardial effusion. Mitral Valve: The mitral valve is degenerative in appearance. There is severe calcification of the anterior mitral valve leaflet(s). No evidence of mitral valve regurgitation. No evidence of mitral valve stenosis. Tricuspid Valve: The tricuspid valve is normal in structure. Tricuspid valve regurgitation is trivial. No evidence of tricuspid stenosis. Aortic Valve: The aortic valve is tricuspid. There is mild calcification of the aortic valve. Aortic valve regurgitation is not visualized. No aortic stenosis is present. Aortic valve mean gradient measures 3.0 mmHg. Aortic valve peak gradient measures 6.7 mmHg. Aortic valve area, by VTI measures 2.61 cm. Pulmonic Valve: The pulmonic valve was normal in structure. Pulmonic valve regurgitation is not visualized. No evidence of pulmonic stenosis. Aorta: The aortic root and  ascending aorta are structurally normal, with no evidence of dilitation. Venous: The inferior vena cava was not well visualized. IAS/Shunts: The interatrial septum was not well visualized.  LEFT VENTRICLE PLAX 2D LVIDd:         4.90 cm     Diastology LVIDs:         3.50 cm     LV e' medial:    5.33 cm/s LV PW:         1.10 cm     LV E/e' medial:  12.4 LV IVS:        1.00 cm     LV e' lateral:   8.92 cm/s LVOT diam:     2.20 cm     LV E/e' lateral: 7.4 LV SV:         59 LV SV Index:   35 LVOT Area:     3.80 cm  LV Volumes (MOD) LV vol d, MOD A2C: 61.6 ml LV vol d, MOD A4C: 97.3 ml LV vol s, MOD A2C: 30.2 ml LV vol s, MOD A4C: 51.8 ml LV SV MOD A2C:     31.4 ml LV SV MOD A4C:     97.3 ml LV SV MOD BP:      39.3 ml RIGHT VENTRICLE RV Basal diam:  2.70 cm RV S prime:     16.90 cm/s TAPSE (M-mode): 1.7 cm LEFT ATRIUM             Index        RIGHT ATRIUM          Index LA diam:        2.80 cm 1.64 cm/m   RA Area:     7.17 cm LA Vol (A2C):   36.8 ml 21.51 ml/m  RA Volume:   11.20 ml 6.55 ml/m LA Vol (A4C):   49.2 ml 28.76 ml/m LA Biplane Vol: 43.9 ml 25.66 ml/m  AORTIC VALVE AV Area (Vmax):    2.60 cm AV Area (Vmean):   2.63 cm AV Area (VTI):     2.61 cm AV Vmax:           129.00 cm/s AV Vmean:          82.700 cm/s AV VTI:            0.227 m AV Peak Grad:      6.7 mmHg AV Mean Grad:      3.0 mmHg LVOT Vmax:         88.30 cm/s LVOT Vmean:        57.200 cm/s LVOT VTI:          0.156 m LVOT/AV VTI ratio: 0.69  AORTA Ao Root diam: 3.30 cm Ao Asc diam:  3.30 cm MITRAL VALVE MV Area (PHT): 2.62 cm    SHUNTS MV Decel Time: 289 msec    Systemic VTI:  0.16 m MV E velocity: 66.00 cm/s  Systemic Diam: 2.20 cm MV A velocity: 83.20 cm/s MV E/A ratio:  0.79 Vishnu Priya Mallipeddi Electronically signed by Vangie Bicker Signature Date/Time: 12/11/2022/12:06:21 PM    Final    Portable Chest 1 View  Result Date: 12/11/2022 CLINICAL DATA:  Syncope. EXAM: PORTABLE CHEST 1 VIEW COMPARISON:  09/14/2020. FINDINGS: The  heart size and mediastinal contours are within normal limits. Lung volumes are low with elevation of the right diaphragm. There is atelectasis at the lung bases bilaterally. No effusion or pneumothorax. No acute osseous abnormality. IMPRESSION: Low lung volumes with atelectasis at the lung bases. Electronically Signed   By: Brett Fairy M.D.   On: 12/11/2022 04:32     Assessment and Plan:   #Syncope Secondary to orthostatic hypotension and dehydration. I do not suspect an arrhythmic etiology.  I would recommend holding her home lasix and using it only on a PRN basis. I would recommend stopping her home spironolactone as well. The spironolactone can be reconsidered after her diarrhea is better controlled. Recommend discussing medication regimen with GI given it seems to be leading to significant volume loss from diarrhea.  EP will sign off. Please call with questions/concerns.  For questions or updates, please contact [place] Please consult [email or web address] for contact info under    Signed, Vickie Epley, MD  12/11/2022 4:20 PM

## 2022-12-11 NOTE — H&P (Addendum)
History and Physical    Debbie [name] YFV:[id number] DOB: July 28, 1951 DOA: 12/10/2022  PCP: Harlan Stains, MD  Patient coming from: HOME  I have personally briefly reviewed patient's old medical records in [place]  Chief Complaint: SYNCOPE X 2  HPI: Debbie [name] is a 72 y.o. female with medical history significant of  IBS, depression CAD mild nonobstructive on CT ,anxiety , HLD,HTN, Hfref,  who presents to ED s/p 2 episode of syncope insetting of recent bout of diarrhea  due to hx of IBS constipation s/p treatment which usually leads to diarrhea. Patient notes this cycle has been on going for weeks. She states she feels dehydrated. On further ros she chronic abdominal pain, no n/v/dysuria, chest pain, HA or focal weakness.  ED Course:  In ed patient found to have significant orthostasis Afeb, bp 119/73 standing 71/48 hr 81, Hr 85 rr 18  Lactic 3.1 Labs Wbc 11.7, hgb 14, plt 358  Na 137, K 3.8, gly 116, cr 0.97 CE 5 EKG sinus rhythm : nonspecific t wave abn  no sig change from prior  Tx 2L LR Review of Systems: As per HPI otherwise 10 point review of systems negative.   Past Medical History:  Diagnosis Date   Acid reflux    ALLERGIC RHINITIS    Anxiety    CAD (coronary artery disease), native coronary artery    coronary CTA 11/2019 with a calcium score of 146 and mild nonobstructive plaque in the mid LAD.   Depression    Diverticulosis    Dyspnea    with exertion    Esophageal stricture    Hiatal hernia    with  Schatzki's Ring   History of kidney stones    Hyperlipidemia    Hypertension    IBS (irritable bowel syndrome)     Past Surgical History:  Procedure Laterality Date   ABDOMINAL HYSTERECTOMY     ANKLE FRACTURE SURGERY  2012   right   COLONOSCOPY  multiple   ESOPHAGOGASTRODUODENOSCOPY  09/14/05   multiple   KIDNEY STONE SURGERY     TONSILLECTOMY     TUBAL LIGATION     VESICOVAGINAL FISTULA CLOSURE W/ TAH     XI ROBOTIC ASSISTED  HIATAL HERNIA REPAIR N/A 01/13/2021   Procedure: ROBOTIC ASSISTED NISSEN FUNDOPLICATION;  Surgeon: Felicie Morn, MD;  Location: [facility];  Service: General;  Laterality: N/A;     reports that she has never smoked. She has never used smokeless tobacco. She reports that she does not drink alcohol and does not use drugs.  No Known Allergies  Family History  Problem Relation Age of Onset   Allergies Mother    Heart disease Mother        bypass surgery   Hypertension Mother    Hyperlipidemia Mother    Heart disease Father        bypass surgery   Hypertension Father    Hyperlipidemia Father    Stroke Father    Diabetes Paternal Grandmother    Allergies Daughter    Allergies Son    Colon cancer Neg Hx    Esophageal cancer Neg Hx    Rectal cancer Neg Hx    Stomach cancer Neg Hx     Prior to Admission medications   Medication Sig Start Date End Date Taking? Authorizing Provider  aspirin EC 81 MG tablet Take 1 tablet (81 mg total) by mouth daily. 11/08/19  Yes [name], MD  atorvastatin (LIPITOR) 80  MG tablet Take 80 mg by mouth daily.   Yes [provider]  cetirizine (ZYRTEC) 5 MG tablet Take 5 mg by mouth daily.   Yes [provider]  Cholecalciferol (VITAMIN D-3) 1000 units CAPS Take 1,000 Units by mouth daily.   Yes [provider]  CYMBALTA 60 MG capsule Take 120 mg by mouth at bedtime. 05/21/12  Yes [provider]  ezetimibe (ZETIA) 10 MG tablet Take 1 tablet (10 mg total) by mouth daily. 11/23/20  Yes Turner, Eber Hong, MD  fluticasone (FLONASE) 50 MCG/ACT nasal spray Place 2 sprays into both nostrils daily as needed for allergies or rhinitis.  12/13/13  Yes [provider]  furosemide (LASIX) 20 MG tablet Take 1 tablet (20 mg total) by mouth daily. 08/18/22 08/19/23 Yes Swinyer, Lanice Schwab, NP  lisinopril (ZESTRIL) 10 MG tablet Take 10 mg by mouth daily. 07/18/22  Yes [provider]  Magnesium 500 MG TABS Take 500 mg by  mouth daily.   Yes [provider]  Multiple Vitamin (MULTIVITAMIN) capsule Take 1 capsule by mouth every morning.    Yes [provider]  pantoprazole (PROTONIX) 40 MG tablet Take 40 mg by mouth 2 (two) times daily. 02/10/17  Yes [provider]  potassium chloride (KLOR-CON M) 10 MEQ tablet Take 1 tablet (10 mEq total) by mouth daily. 08/18/22 08/19/23 Yes Swinyer, Lanice Schwab, NP  spironolactone (ALDACTONE) 25 MG tablet Take 1 tablet (25 mg total) by mouth daily. 10/26/22 10/27/23 Yes Swinyer, Lanice Schwab, NP  Vibegron (GEMTESA) 75 MG TABS Take 75 mg by mouth daily.   Yes [provider]  erythromycin (ERY-TAB) 250 MG EC tablet Take 250 mg by mouth 3 (three) times daily. Patient not taking: Reported on 12/11/2022 12/08/22   [provider]    Physical Exam: Vitals:   12/11/22 0115 12/11/22 0116 12/11/22 0119 12/11/22 0134  BP: 103/66 (!) 71/48  91/64  Pulse: 82 81 92 81  Resp:  18 (!) 22 19  Temp:  97.9 F (36.6 C)    TempSrc:      SpO2:  95%  93%  Weight:      Height:        Constitutional: NAD, calm, comfortable Vitals:   12/11/22 0115 12/11/22 0116 12/11/22 0119 12/11/22 0134  BP: 103/66 (!) 71/48  91/64  Pulse: 82 81 92 81  Resp:  18 (!) 22 19  Temp:  97.9 F (36.6 C)    TempSrc:      SpO2:  95%  93%  Weight:      Height:       Eyes: PERRL, lids and conjunctivae normal ENMT: Mucous membranes are moist. Posterior pharynx clear of any exudate or lesions.Normal dentition.  Neck: normal, supple, no masses, no thyromegaly Respiratory: clear to auscultation bilaterally, no wheezing, no crackles. Normal respiratory effort. No accessory muscle use.  Cardiovascular: Regular rate and rhythm, no murmurs / rubs / gallops. No extremity edema. 2+ pedal pulses.  Abdomen: no tenderness, no masses palpated. No hepatosplenomegaly. Bowel sounds positive.  Musculoskeletal: no clubbing / cyanosis. No joint deformity upper and lower extremities. Good  ROM, no contractures. Normal muscle tone.  Skin: no rashes, lesions, ulcers. No induration Neurologic: CN 2-12 grossly intact. Sensation intact, Strength 5/5 in all 4.  Psychiatric: Normal judgment and insight. Alert and oriented x 3. Normal mood.    Labs on Admission: I have personally reviewed following labs and imaging studies  CBC: Recent Labs  Lab 12/11/22  0002  WBC 11.7*  NEUTROABS 7.9*  HGB 14.0  HCT 44.1  MCV 87.8  PLT 366   Basic Metabolic Panel: Recent Labs  Lab 12/11/22 0002  NA 137  K 3.8  CL 98  CO2 29  GLUCOSE 116*  BUN 19  CREATININE 0.97  CALCIUM 8.3*   GFR: Estimated Creatinine Clearance: 50.1 mL/min (by C-G formula based on SCr of 0.97 mg/dL). Liver Function Tests: No results for input(s): "AST", "ALT", "ALKPHOS", "BILITOT", "PROT", "ALBUMIN" in the last 168 hours. No results for input(s): "LIPASE", "AMYLASE" in the last 168 hours. No results for input(s): "AMMONIA" in the last 168 hours. Coagulation Profile: No results for input(s): "INR", "PROTIME" in the last 168 hours. Cardiac Enzymes: No results for input(s): "CKTOTAL", "CKMB", "CKMBINDEX", "TROPONINI" in the last 168 hours. BNP (last 3 results) No results for input(s): "PROBNP" in the last 8760 hours. HbA1C: No results for input(s): "HGBA1C" in the last 72 hours. CBG: No results for input(s): "GLUCAP" in the last 168 hours. Lipid Profile: No results for input(s): "CHOL", "HDL", "LDLCALC", "TRIG", "CHOLHDL", "LDLDIRECT" in the last 72 hours. Thyroid Function Tests: No results for input(s): "TSH", "T4TOTAL", "FREET4", "T3FREE", "THYROIDAB" in the last 72 hours. Anemia Panel: No results for input(s): "VITAMINB12", "FOLATE", "FERRITIN", "TIBC", "IRON", "RETICCTPCT" in the last 72 hours. Urine analysis:    Component Value Date/Time   COLORURINE STRAW (A) 04/22/2017 0806   APPEARANCEUR CLEAR 04/22/2017 0806   LABSPEC 1.012 04/22/2017 0806   PHURINE 5.0 04/22/2017 0806   GLUCOSEU NEGATIVE  04/22/2017 0806   HGBUR NEGATIVE 04/22/2017 0806   BILIRUBINUR negative 07/16/2022 1055   KETONESUR negative 07/16/2022 1055   KETONESUR NEGATIVE 04/22/2017 0806   PROTEINUR =30 (A) 07/16/2022 1055   PROTEINUR NEGATIVE 04/22/2017 0806   UROBILINOGEN 1.0 07/16/2022 1055   UROBILINOGEN 1.0 07/30/2007 1342   NITRITE Negative 07/16/2022 1055   NITRITE NEGATIVE 04/22/2017 0806   LEUKOCYTESUR Negative 07/16/2022 1055    Radiological Exams on Admission: No results found.  EKG: Independently reviewed. 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% echo 08/30/22 -resume spironolactone '25mg'$  daily , continue lasix , lisinopril  as able once patient no longer orthostatic  -not able to tolerate [place] due to yest infection   Nonobstructive CAD  -coronary calcium score 146  -continue  asa   Hypertension  -due to orthostasis  -will hold medications at this time  -resume as able   Hyperlipedemia -continue atorvastatin    DVT prophylaxis: heparin Code Status: full/ as discussed per patient wishes in event of cardiac arrest  Family Communication: none at bedside Disposition Plan: patient  expected to be admitted greater than 2 midnights  Consults called: n/a Admission status: progressive care    Clance Boll MD [facility]   If 7PM-7AM, please contact night-coverage [email or web address] Password [facility]  12/11/2022, 3:23 AM

## 2022-12-11 NOTE — ED Notes (Signed)
ORTHOSTATIC VS:  Lying -  BP 115/76 HR 84  Sitting - BP 109/62 HR 81  Standing - BP 87/53 HR 89  "A TINY BIT DIZZY WHEN SHE STOOD UP"

## 2022-12-11 NOTE — ED Provider Notes (Signed)
[facility] EMERGENCY DEPARTMENT Provider Note   CSN: [id number] Arrival date & time: 12/10/22  2359     History  Chief Complaint  Patient presents with   Loss of Consciousness    Debbie [name] is a 72 y.o. female.  The history is provided by the patient.  Loss of Consciousness She has 3 of hypertension, hyperlipidemia, GERD, coronary artery disease and is brought in by ambulance after 2 syncopal episodes at home.  She has been having problems with orthostatic lightheadedness for several months, but tonight she had an episode where she sank to the floor and was staring ahead with a blank stare and she has no memory of this incident.  Then later, she was trying to walk to the bathroom and had a complete syncopal episode.  Length of unconsciousness was estimated at 1-2 minutes.  She denies chest pain, heaviness, tightness, pressure.  She denies any palpitations.  This is the first time that she has actually passed out.  She is concerned she might be dehydrated.  She does take furosemide.  She also takes something for inability to have a bowel movement and tends to have diarrhea following taking that.  She states for the last 3 months, she has either had no bowel movement at all or diarrhea.  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   Home Medications Prior to Admission medications   Medication Sig Start Date End Date Taking? Authorizing Provider  aspirin EC 81 MG tablet Take 1 tablet (81 mg total) by mouth daily. 11/08/19   [name], MD  atorvastatin (LIPITOR) 80 MG tablet Take 80 mg by mouth daily.    [provider]  cetirizine (ZYRTEC) 5 MG tablet Take 5 mg by mouth daily.    [provider]  Cholecalciferol (VITAMIN D-3) 1000 units CAPS Take 1,000 Units by mouth daily.    [provider]  CYMBALTA 60 MG capsule Take 120 mg  by mouth at bedtime. 05/21/12   [provider]  ezetimibe (ZETIA) 10 MG tablet Take 1 tablet (10 mg total) by mouth daily. 11/23/20   [name], MD  famotidine (PEPCID) 20 MG tablet Take 20 mg by mouth 2 (two) times daily. 09/21/20   [provider]  fluticasone (FLONASE) 50 MCG/ACT nasal spray Place 2 sprays into both nostrils daily as needed for allergies or rhinitis.  12/13/13   [provider]  furosemide (LASIX) 20 MG tablet Take 1 tablet (20 mg total) by mouth daily. 08/18/22 08/19/23  Swinyer, Lanice Schwab, NP  ibuprofen (ADVIL) 600 MG tablet Take 1 tablet (600 mg total) by mouth every 8 (eight) hours as needed. Patient taking differently: Take 600 mg by mouth every 8 (eight) hours as needed for mild pain or headache. 02/25/20   Jaynee Eagles, PA-C  lisinopril (ZESTRIL) 10 MG tablet Take 10 mg by mouth daily. 07/18/22   [provider]  Magnesium 500 MG TABS Take 500 mg by mouth daily.    [provider]  Multiple Vitamin (MULTIVITAMIN) capsule Take 1 capsule by mouth every morning.     [provider]  pantoprazole (PROTONIX) 40 MG tablet Take 40 mg by mouth 2 (two) times daily. 02/10/17   [provider]  potassium chloride (KLOR-CON M) 10 MEQ tablet Take 1 tablet (10 mEq total) by mouth daily. 08/18/22 08/19/23  Swinyer, Lanice Schwab,  NP  spironolactone (ALDACTONE) 25 MG tablet Take 1 tablet (25 mg total) by mouth daily. 10/26/22 10/27/23  Swinyer, Lanice Schwab, NP      Allergies    Patient has no known allergies.    Review of Systems   Review of Systems  Cardiovascular:  Positive for syncope.  All other systems reviewed and are negative.   Physical Exam Updated Vital Signs BP 119/73   Pulse 85   Temp 97.7 F (36.5 C) (Oral)   Resp 18   Ht '5\' 2"'$  (1.575 m)   Wt 69.9 kg   SpO2 96%   BMI 28.17 kg/m  Physical Exam Vitals and nursing note reviewed.   72 year old female, resting comfortably and in no acute distress. Vital  signs are normal96. Oxygen saturation is 93%, which is normal. Head is normocephalic and atraumatic. PERRLA, EOMI. Oropharynx is clear. Neck is nontender and supple without adenopathy or JVD.  There are no carotid bruits. Back is nontender and there is no CVA tenderness. Lungs are clear without rales, wheezes, or rhonchi. Chest is nontender. Heart has regular rate and rhythm without murmur. Abdomen is soft, flat, with mild upper abdominal tenderness.  There is no rebound or guarding. Extremities have no cyanosis or edema, full range of motion is present. Skin is warm and dry without rash. Neurologic: Mental status is normal, cranial nerves are intact, moves all extremities equally.  ED Results / Procedures / Treatments   Labs (all labs ordered are listed, but only abnormal results are displayed) Labs Reviewed  BASIC METABOLIC PANEL  LACTIC ACID, PLASMA  CBC WITH DIFFERENTIAL/PLATELET  TROPONIN I (HIGH SENSITIVITY)    EKG EKG Interpretation  Date/Time:  Sunday December 11 2022 00:02:15 EST Ventricular Rate:  88 PR Interval:  135 QRS Duration: 81 QT Interval:  354 QTC Calculation: 429 R Axis:   19 Text Interpretation: Sinus rhythm Nonspecific T abnrm, anterolateral leads When compared with ECG of 01/04/2021, No significant change was found Confirmed by Delora Fuel ([id number]) on 12/11/2022 12:05:21 AM  Radiology No results found.  Procedures Procedures  Cardiac monitor shows normal sinus rhythm, per my interpretation.  Medications Ordered in ED Medications  lactated ringers bolus 1,000 mL (1,000 mLs Intravenous New Bag/Given 12/11/22 0140)  lactated ringers bolus 1,000 mL (1,000 mLs Intravenous New Bag/Given 12/11/22 0139)    ED Course/ Medical Decision Making/ A&P                           Medical Decision Making Amount and/or Complexity of Data Reviewed Labs: ordered.  Risk Decision regarding hospitalization.   Syncope which does appear to be orthostatic in nature.   Patient states she has received some IV fluids in the ambulance, I am ordering orthostatic vital signs.  I have reviewed and interpreted her electrocardiogram, and my interpretation is nonspecific T wave flattening which is unchanged from prior.  I have ordered screening labs of CBC, basic metabolic panel, troponin x 2, lactic acid x 1.  Unfortunately, on review of her past records, I am unable to see the reports of a recent upper endoscopy and colonoscopy.  Orthostatic vital signs are markedly positive.  Blood pressure dropped to 71 systolic with sitting and she is not able to tolerate standing.  I have reviewed and interpreted her laboratory test, and my interpretation is elevated random glucose level, normal BUN and creatinine but BUN to creatinine ratio is 20 suggesting dehydration, mild leukocytosis which is nonspecific,  normal hemoglobin, normal troponin, moderately elevated lactic acid level consistent with episode of recent hypotension.  I do not believe that this represents sepsis.  Of note, last hemoglobin was 11.0 raising concern that current normal hemoglobin represents some degree of hemoconcentration.  However, prior hemoglobin was from 01/14/2021 and I have no more recent lab values available for comparison.  I have ordered aggressive IV hydration.  I have discussed the case with Dr. Marcello Moores of [facility] who agrees to admit the patient.  CRITICAL CARE Performed by: Delora Fuel Total critical care time: 35 minutes Critical care time was exclusive of separately billable procedures and treating other patients. Critical care was necessary to treat or prevent imminent or life-threatening deterioration. Critical care was time spent personally by me on the following activities: development of treatment plan with patient and/or surrogate as well as nursing, discussions with consultants, evaluation of patient's response to treatment, examination of patient, obtaining history from patient or  surrogate, ordering and performing treatments and interventions, ordering and review of laboratory studies, ordering and review of radiographic studies, pulse oximetry and re-evaluation of patient's condition.  Final Clinical Impression(s) / ED Diagnoses Final diagnoses:  Orthostatic syncope  Elevated lactic acid level  Elevated random blood glucose level    Rx / DC Orders ED Discharge Orders     None         Delora Fuel, MD 44/92/01 [phone]

## 2022-12-11 NOTE — Progress Notes (Signed)
  Echocardiogram 2D Echocardiogram has been performed.  Debbie [name] 12/11/2022, 9:34 AM

## 2022-12-11 NOTE — ED Notes (Signed)
Patient assisted back to bed from bedside commode by this tech with minimal assistance. Linen changed and new brief placed on patient.

## 2022-12-11 NOTE — ED Notes (Signed)
ED TO INPATIENT HANDOFF REPORT  ED Nurse Name and Phone #:  Heath Lark [phone]  S Name/Age/Gender Debbie [name] 72 y.o. female Room/Bed: 008C/008C  Code Status   Code Status: Full Code  Home/SNF/Other Home Patient oriented to: self, place, time, and situation Is this baseline? Yes   Triage Complete: Triage complete  Chief Complaint Syncope [R55]  Triage Note Pt had LOC x2 episodes. She fell with no injuries.  Pt states she has had Constipation but taking meds to help her have a  BM but has lead to diarrhea.     Allergies No Known Allergies  Level of Care/Admitting Diagnosis ED Disposition     ED Disposition  Admit   Condition  --   Comment  Hospital Area: [facility] [[id number]]  Level of Care: Telemetry Cardiac [103]  May admit patient to [facility] or [facility] if equivalent level of care is available:: No  Covid Evaluation: Symptomatic Person Under Investigation (PUI) or recent exposure (last 10 days) *Testing Required*  Diagnosis: Syncope [206001]  Admitting Physician: Clance Boll [[id number]]  Attending Physician: Clance Boll [[id number]]  Certification:: I certify this patient will need inpatient services for at least 2 midnights  Estimated Length of Stay: 3          B Medical/Surgery History Past Medical History:  Diagnosis Date   Acid reflux    ALLERGIC RHINITIS    Anxiety    CAD (coronary artery disease), native coronary artery    coronary CTA 11/2019 with a calcium score of 146 and mild nonobstructive plaque in the mid LAD.   Depression    Diverticulosis    Dyspnea    with exertion    Esophageal stricture    Hiatal hernia    with  Schatzki's Ring   History of kidney stones    Hyperlipidemia    Hypertension    IBS (irritable bowel syndrome)    Past Surgical History:  Procedure Laterality Date   ABDOMINAL HYSTERECTOMY     ANKLE FRACTURE SURGERY  2012   right   COLONOSCOPY  multiple    ESOPHAGOGASTRODUODENOSCOPY  09/14/05   multiple   KIDNEY STONE SURGERY     TONSILLECTOMY     TUBAL LIGATION     VESICOVAGINAL FISTULA CLOSURE W/ TAH     XI ROBOTIC ASSISTED HIATAL HERNIA REPAIR N/A 01/13/2021   Procedure: ROBOTIC ASSISTED NISSEN FUNDOPLICATION;  Surgeon: Felicie Morn, MD;  Location: [facility];  Service: General;  Laterality: N/A;     A IV Location/Drains/Wounds Patient Lines/Drains/Airways Status     Active Line/Drains/Airways     Name Placement date Placement time Site Days   Peripheral IV 12/11/22 20 G 1.16" Left Antecubital 12/11/22  0007  Antecubital  less than 1   Incision - 6 Ports Abdomen Right;Lateral Right Mid Mid;Upper Left Left;Lower 01/13/21  0915  -- 697            Intake/Output Last 24 hours  Intake/Output Summary (Last 24 hours) at 12/11/2022 1836 Last data filed at 12/11/2022 0253 Gross per 24 hour  Intake 2000 ml  Output --  Net 2000 ml    Labs/Imaging Results for orders placed or performed during the hospital encounter of 12/10/22 (from the past 48 hour(s))  Basic metabolic panel     Status: Abnormal   Collection Time: 12/11/22 12:02 AM  Result Value Ref Range   Sodium 137 135 - 145 mmol/L   Potassium 3.8 3.5 - 5.1 mmol/L  Chloride 98 98 - 111 mmol/L   CO2 29 22 - 32 mmol/L   Glucose, Bld 116 (H) 70 - 99 mg/dL    Comment: Glucose reference range applies only to samples taken after fasting for at least 8 hours.   BUN 19 8 - 23 mg/dL   Creatinine, Ser 0.97 0.44 - 1.00 mg/dL   Calcium 8.3 (L) 8.9 - 10.3 mg/dL   GFR, Estimated >60 >60 mL/min    Comment: (NOTE) Calculated using the CKD-EPI Creatinine Equation (2021)    Anion gap 10 5 - 15    Comment: Performed at [facility] [address] [place], [place] [address]  Troponin I (High Sensitivity)     Status: None   Collection Time: 12/11/22 12:02 AM  Result Value Ref Range   Troponin I (High Sensitivity) 5 <18 ng/L    Comment: (NOTE) Elevated high sensitivity troponin  I (hsTnI) values and significant  changes across serial measurements may suggest ACS but many other  chronic and acute conditions are known to elevate hsTnI results.  Refer to the "Links" section for chest pain algorithms and additional  guidance. Performed at [facility], [place] [address], [place], [place] [address]   CBC with Differential     Status: Abnormal   Collection Time: 12/11/22 12:02 AM  Result Value Ref Range   WBC 11.7 (H) 4.0 - 10.5 K/uL   RBC 5.02 3.87 - 5.11 MIL/uL   Hemoglobin 14.0 12.0 - 15.0 g/dL   HCT 44.1 36.0 - 46.0 %   MCV 87.8 80.0 - 100.0 fL   MCH 27.9 26.0 - 34.0 pg   MCHC 31.7 30.0 - 36.0 g/dL   RDW 14.5 11.5 - 15.5 %   Platelets 358 150 - 400 K/uL   nRBC 0.0 0.0 - 0.2 %   Neutrophils Relative % 68 %   Neutro Abs 7.9 (H) 1.7 - 7.7 K/uL   Lymphocytes Relative 22 %   Lymphs Abs 2.6 0.7 - 4.0 K/uL   Monocytes Relative 8 %   Monocytes Absolute 1.0 0.1 - 1.0 K/uL   Eosinophils Relative 2 %   Eosinophils Absolute 0.2 0.0 - 0.5 K/uL   Basophils Relative 0 %   Basophils Absolute 0.0 0.0 - 0.1 K/uL   Immature Granulocytes 0 %   Abs Immature Granulocytes 0.03 0.00 - 0.07 K/uL    Comment: Performed at [facility] [address] [place], [place] [address]  D-dimer, quantitative     Status: Abnormal   Collection Time: 12/11/22 12:02 AM  Result Value Ref Range   D-Dimer, Quant 1.03 (H) 0.00 - 0.50 ug/mL-FEU    Comment: (NOTE) At the manufacturer cut-off value of 0.5 g/mL FEU, this assay has a negative predictive value of 95-100%.This assay is intended for use in conjunction with a clinical pretest probability (PTP) assessment model to exclude pulmonary embolism (PE) and deep venous thrombosis (DVT) in outpatients suspected of PE or DVT. Results should be correlated with clinical presentation. Performed at [facility], [place] [address], [place], [place] [address]   Lactic acid, plasma     Status: Abnormal   Collection Time: 12/11/22  12:26 AM  Result Value Ref Range   Lactic Acid, Venous 3.1 (HH) 0.5 - 1.9 mmol/L    Comment: CRITICAL RESULT CALLED TO, READ BACK BY AND VERIFIED WITH Carron Curie RN 12/11/22 0129 Wiliam Ke Performed at [facility], [place] [address] [place], [place] [address]  Troponin I (High Sensitivity)     Status: None   Collection Time: 12/11/22  4:00 AM  Result Value Ref Range   Troponin I (High Sensitivity) 5 <18 ng/L    Comment: (NOTE) Elevated high sensitivity troponin I (hsTnI) values and significant  changes across serial measurements may suggest ACS but many other  chronic and acute conditions are known to elevate hsTnI results.  Refer to the Links section for chest pain algorithms and additional  guidance. Performed at [facility], [place] [address], [place], [place] [address]   HIV Antibody (routine testing w rflx)     Status: None   Collection Time: 12/11/22  4:00 AM  Result Value Ref Range   HIV Screen 4th Generation wRfx Non Reactive Non Reactive    Comment: Performed at [facility], [place] [address], [place], [place] [address]  CBC     Status: None   Collection Time: 12/11/22  4:00 AM  Result Value Ref Range   WBC 8.9 4.0 - 10.5 K/uL   RBC 4.43 3.87 - 5.11 MIL/uL   Hemoglobin 12.2 12.0 - 15.0 g/dL   HCT 38.5 36.0 - 46.0 %   MCV 86.9 80.0 - 100.0 fL   MCH 27.5 26.0 - 34.0 pg   MCHC 31.7 30.0 - 36.0 g/dL   RDW 14.4 11.5 - 15.5 %   Platelets 305 150 - 400 K/uL   nRBC 0.0 0.0 - 0.2 %    Comment: Performed at [facility], [place] [address] [place], [place] [address]  Creatinine, serum     Status: None   Collection Time: 12/11/22  4:00 AM  Result Value Ref Range   Creatinine, Ser 0.93 0.44 - 1.00 mg/dL   GFR, Estimated >60 >60 mL/min    Comment: (NOTE) Calculated using the CKD-EPI Creatinine Equation (2021) Performed at [facility] [address], [place], [place] [address]   Cortisol     Status: None   Collection Time: 12/11/22  4:00 AM   Result Value Ref Range   Cortisol, Plasma 3.7 ug/dL    Comment: (NOTE) AM    6.7 - 22.6 ug/dL PM   <10.0       ug/dL Performed at [facility] [address] [place], [place] [address]   Urinalysis, Complete w Microscopic Nasopharyngeal Swab     Status: Abnormal   Collection Time: 12/11/22  4:42 AM  Result Value Ref Range   Color, Urine YELLOW YELLOW   APPearance HAZY (A) CLEAR   Specific Gravity, Urine 1.010 1.005 - 1.030   pH 7.0 5.0 - 8.0   Glucose, UA NEGATIVE NEGATIVE mg/dL   Hgb urine dipstick NEGATIVE NEGATIVE   Bilirubin Urine NEGATIVE NEGATIVE   Ketones, ur NEGATIVE NEGATIVE mg/dL   Protein, ur NEGATIVE NEGATIVE mg/dL   Nitrite NEGATIVE NEGATIVE   Leukocytes,Ua TRACE (A) NEGATIVE   RBC / HPF 0-5 0 - 5 RBC/hpf   WBC, UA 0-5 0 - 5 WBC/hpf   Bacteria, UA MANY (A) NONE SEEN   Squamous Epithelial / HPF 0-5 0 - 5 /HPF   Hyaline Casts, UA PRESENT     Comment: Performed at [facility], [address] [place],  [address]  Respiratory (~20 pathogens) panel by PCR     Status: Abnormal   Collection Time: 12/11/22  5:30 AM   Specimen: Nasopharyngeal Swab; Respiratory  Result Value Ref Range   Adenovirus NOT DETECTED NOT DETECTED  Coronavirus 229E NOT DETECTED NOT DETECTED    Comment: (NOTE) The Coronavirus on the Respiratory Panel, DOES NOT test for the novel  Coronavirus (2019 nCoV)    Coronavirus HKU1 NOT DETECTED NOT DETECTED   Coronavirus NL63 NOT DETECTED NOT DETECTED   Coronavirus OC43 NOT DETECTED NOT DETECTED   Metapneumovirus NOT DETECTED NOT DETECTED   Rhinovirus / Enterovirus NOT DETECTED NOT DETECTED   Influenza A NOT DETECTED NOT DETECTED   Influenza B DETECTED (A) NOT DETECTED   Parainfluenza Virus 1 NOT DETECTED NOT DETECTED   Parainfluenza Virus 2 NOT DETECTED NOT DETECTED   Parainfluenza Virus 3 NOT DETECTED NOT DETECTED   Parainfluenza Virus 4 NOT DETECTED NOT DETECTED   Respiratory Syncytial Virus NOT DETECTED NOT DETECTED    Bordetella pertussis NOT DETECTED NOT DETECTED   Bordetella Parapertussis NOT DETECTED NOT DETECTED   Chlamydophila pneumoniae NOT DETECTED NOT DETECTED   Mycoplasma pneumoniae NOT DETECTED NOT DETECTED    Comment: Performed at [facility], [place] [address], [place], [place] [address]  CBG monitoring, ED     Status: None   Collection Time: 12/11/22  5:31 AM  Result Value Ref Range   Glucose-Capillary 98 70 - 99 mg/dL    Comment: Glucose reference range applies only to samples taken after fasting for at least 8 hours.  Lactic acid, plasma     Status: None   Collection Time: 12/11/22  5:19 PM  Result Value Ref Range   Lactic Acid, Venous 0.7 0.5 - 1.9 mmol/L    Comment: Performed at [facility], [place] [address], [place], [place] [address]   ECHOCARDIOGRAM COMPLETE  Result Date: 12/11/2022    ECHOCARDIOGRAM REPORT   Patient Name:   Debbie [name] Date of Exam: 12/11/2022 Medical Rec #:  [id number]            Height:       62.0 in Accession #:    [id number]           Weight:       154.0 lb Date of Birth:  [birth year]             BSA:          84.711 m Patient Age:    67 years             BP:           123/53 mmHg Patient Gender: F                    HR:           79 bpm. Exam Location:  Inpatient Procedure: 2D Echo, Cardiac Doppler and Color Doppler Indications:    Syncope  History:        Patient has prior history of Echocardiogram examinations, most                 recent 08/30/2022. CAD; Risk Factors:Hypertension and                 Dyslipidemia.  Sonographer:    Clayton Lefort RDCS (AE) Referring Phys: Clance Boll  Sonographer Comments: Suboptimal subcostal window. IMPRESSIONS  1. Left ventricular ejection fraction, by estimation, is 50 to 55%. The left ventricle has low normal function. Left ventricular endocardial border not optimally defined to evaluate regional wall motion. Left ventricular diastolic parameters are consistent with Grade I diastolic dysfunction (impaired relaxation).   2. Right ventricular systolic function was not well visualized.  The right ventricular size is normal. Tricuspid regurgitation signal is inadequate for assessing PA pressure.  3. The mitral valve is degenerative. No evidence of mitral valve regurgitation. No evidence of mitral stenosis.  4. The aortic valve is tricuspid. There is mild calcification of the aortic valve. Aortic valve regurgitation is not visualized. No aortic stenosis is present. Comparison(s): No significant change from prior study. Prior images reviewed side by side. FINDINGS  Left Ventricle: Left ventricular ejection fraction, by estimation, is 50 to 55%. The left ventricle has low normal function. Left ventricular endocardial border not optimally defined to evaluate regional wall motion. The left ventricular internal cavity  size was normal in size. There is no left ventricular hypertrophy. Left ventricular diastolic parameters are consistent with Grade I diastolic dysfunction (impaired relaxation). Right Ventricle: The right ventricular size is normal. No increase in right ventricular wall thickness. Right ventricular systolic function was not well visualized. Tricuspid regurgitation signal is inadequate for assessing PA pressure. Left Atrium: Left atrial size was normal in size. Right Atrium: Right atrial size was normal in size. Pericardium: There is no evidence of pericardial effusion. Mitral Valve: The mitral valve is degenerative in appearance. There is severe calcification of the anterior mitral valve leaflet(s). No evidence of mitral valve regurgitation. No evidence of mitral valve stenosis. Tricuspid Valve: The tricuspid valve is normal in structure. Tricuspid valve regurgitation is trivial. No evidence of tricuspid stenosis. Aortic Valve: The aortic valve is tricuspid. There is mild calcification of the aortic valve. Aortic valve regurgitation is not visualized. No aortic stenosis is present. Aortic valve mean gradient measures 3.0 mmHg.  Aortic valve peak gradient measures 6.7 mmHg. Aortic valve area, by VTI measures 2.61 cm. Pulmonic Valve: The pulmonic valve was normal in structure. Pulmonic valve regurgitation is not visualized. No evidence of pulmonic stenosis. Aorta: The aortic root and ascending aorta are structurally normal, with no evidence of dilitation. Venous: The inferior vena cava was not well visualized. IAS/Shunts: The interatrial septum was not well visualized.  LEFT VENTRICLE PLAX 2D LVIDd:         4.90 cm     Diastology LVIDs:         3.50 cm     LV e' medial:    5.33 cm/s LV PW:         1.10 cm     LV E/e' medial:  12.4 LV IVS:        1.00 cm     LV e' lateral:   8.92 cm/s LVOT diam:     2.20 cm     LV E/e' lateral: 7.4 LV SV:         59 LV SV Index:   35 LVOT Area:     3.80 cm  LV Volumes (MOD) LV vol d, MOD A2C: 61.6 ml LV vol d, MOD A4C: 97.3 ml LV vol s, MOD A2C: 30.2 ml LV vol s, MOD A4C: 51.8 ml LV SV MOD A2C:     31.4 ml LV SV MOD A4C:     97.3 ml LV SV MOD BP:      39.3 ml RIGHT VENTRICLE RV Basal diam:  2.70 cm RV S prime:     16.90 cm/s TAPSE (M-mode): 1.7 cm LEFT ATRIUM             Index        RIGHT ATRIUM          Index LA diam:        2.80  cm 1.64 cm/m   RA Area:     7.17 cm LA Vol (A2C):   36.8 ml 21.51 ml/m  RA Volume:   11.20 ml 6.55 ml/m LA Vol (A4C):   49.2 ml 28.76 ml/m LA Biplane Vol: 43.9 ml 25.66 ml/m  AORTIC VALVE AV Area (Vmax):    2.60 cm AV Area (Vmean):   2.63 cm AV Area (VTI):     2.61 cm AV Vmax:           129.00 cm/s AV Vmean:          82.700 cm/s AV VTI:            0.227 m AV Peak Grad:      6.7 mmHg AV Mean Grad:      3.0 mmHg LVOT Vmax:         88.30 cm/s LVOT Vmean:        57.200 cm/s LVOT VTI:          0.156 m LVOT/AV VTI ratio: 0.69  AORTA Ao Root diam: 3.30 cm Ao Asc diam:  3.30 cm MITRAL VALVE MV Area (PHT): 2.62 cm    SHUNTS MV Decel Time: 289 msec    Systemic VTI:  0.16 m MV E velocity: 66.00 cm/s  Systemic Diam: 2.20 cm MV A velocity: 83.20 cm/s MV E/A ratio:  0.79 Vishnu Priya  Mallipeddi Electronically signed by Lorelee Cover Mallipeddi Signature Date/Time: 12/11/2022/12:06:21 PM    Final    Portable Chest 1 View  Result Date: 12/11/2022 CLINICAL DATA:  Syncope. EXAM: PORTABLE CHEST 1 VIEW COMPARISON:  09/14/2020. FINDINGS: The heart size and mediastinal contours are within normal limits. Lung volumes are low with elevation of the right diaphragm. There is atelectasis at the lung bases bilaterally. No effusion or pneumothorax. No acute osseous abnormality. IMPRESSION: Low lung volumes with atelectasis at the lung bases. Electronically Signed   By: Brett Fairy M.D.   On: 12/11/2022 04:32    Pending Labs Unresulted Labs (From admission, onward)     Start     Ordered   12/12/22 9381  Basic metabolic panel  Tomorrow morning,   R        12/11/22 0630   12/12/22 0500  Magnesium  Tomorrow morning,   R        12/11/22 0630   12/11/22 1554  Lactic acid, plasma  STAT Now then every 3 hours,   R (with STAT occurrences)      12/11/22 1553   12/11/22 0352  Norovirus group 1 & 2 by PCR, stool  (Norovirus group 1 & 2 by PCR, stool panel)  Once,   R        12/11/22 0354            Vitals/Pain Today's Vitals   12/11/22 1129 12/11/22 1245 12/11/22 1542 12/11/22 1650  BP:  128/63  (!) 141/85  Pulse:  80  77  Resp:  20  17  Temp: 98.1 F (36.7 C)  97.9 F (36.6 C)   TempSrc: Oral  Oral   SpO2:  91%  97%  Weight:      Height:      PainSc:        Isolation Precautions Enteric precautions (UV disinfection)  Medications Medications  aspirin EC tablet 81 mg (81 mg Oral Given 12/11/22 1003)  atorvastatin (LIPITOR) tablet 80 mg (80 mg Oral Given 12/11/22 1003)  ezetimibe (ZETIA) tablet 10 mg (10 mg Oral Given 12/11/22 1003)  DULoxetine (CYMBALTA)  DR capsule 120 mg (has no administration in time range)  pantoprazole (PROTONIX) EC tablet 40 mg (40 mg Oral Given 12/11/22 1003)  multivitamin with minerals tablet 1 tablet (1 tablet Oral Given 12/11/22 1003)  sodium chloride flush  (NS) 0.9 % injection 3 mL (3 mLs Intravenous Given 12/11/22 1004)  heparin injection 5,000 Units (5,000 Units Subcutaneous Given 12/11/22 1445)  0.9 %  sodium chloride infusion (0 mLs Intravenous Stopped 12/11/22 1510)  lidocaine (LIDODERM) 5 % 1 patch (1 patch Transdermal Patch Applied 12/11/22 1125)  lactated ringers infusion ( Intravenous New Bag/Given 12/11/22 1716)  lactated ringers bolus 1,000 mL (0 mLs Intravenous Stopped 12/11/22 0253)  lactated ringers bolus 1,000 mL (0 mLs Intravenous Stopped 12/11/22 0253)    Mobility walks with person assist Moderate fall risk   Focused Assessments Cardiac Assessment Handoff:    No results found for: "CKTOTAL", "CKMB", "CKMBINDEX", "TROPONINI" Lab Results  Component Value Date   DDIMER 1.03 (H) 12/11/2022   Does the Patient currently have chest pain? No    R Recommendations: See Admitting Provider Note  Report given to:   Additional Notes:  Flu positive

## 2022-12-11 NOTE — ED Notes (Signed)
Date and time results received: 12/11/22 0129 (use smartphrase ".now" to insert current time)  Test: Lactic Acid Critical Value: 3.1  Name of Provider Notified: Roxanne Mins  Orders Received? Or Actions Taken?:

## 2022-12-11 NOTE — ED Triage Notes (Signed)
Pt had LOC x2 episodes. She fell with no injuries.  Pt states she has had Constipation but taking meds to help her have a  BM but has lead to diarrhea.

## 2022-12-11 NOTE — Progress Notes (Signed)
Per HPI: Debbie [name] is a 72 y.o. 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 Patient notes this cycle has been on going for weeks. She states she feels dehydrated. On further ros she chronic abdominal pain, no n/v/dysuria, chest pain, HA or focal weakness.  12/11/22: Seen and examined at bedside.  No dizziness or palpitation while laying supine in the bed.  Endorses losing consciousness.  Was having diarrhea and was on home Lasix and spironolactone.  Positive orthostatic vital signs in the ED.  Repeat orthostatic vital signs are pending.  Repeat echocardiogram done today was unrevealing with improved LVEF.    Syncope, possible secondary to hypovolemia.  The patient inquires about cardiology consultation.  Routine cardiology consult placed to Dr. Quentin Ore via secure chat.  Physical exam: Well-developed well-nourished in no distress.  She is alert and oriented x 3. Regular rate and rhythm no rubs or gallops. Clear to auscultation with no wheezes or rales. No peripheral edema. Mood is appropriate for condition and setting.  Time: 15 minutes.

## 2022-12-12 DIAGNOSIS — I951 Orthostatic hypotension: Secondary | ICD-10-CM | POA: Diagnosis not present

## 2022-12-12 LAB — BASIC METABOLIC PANEL
Anion gap: 5 (ref 5–15)
BUN: 12 mg/dL (ref 8–23)
CO2: 28 mmol/L (ref 22–32)
Calcium: 8.3 mg/dL — ABNORMAL LOW (ref 8.9–10.3)
Chloride: 105 mmol/L (ref 98–111)
Creatinine, Ser: 0.71 mg/dL (ref 0.44–1.00)
GFR, Estimated: >60 mL/min (ref >60)
Glucose, Bld: 115 mg/dL — ABNORMAL HIGH (ref 70–99)
Potassium: 3.9 mmol/L (ref 3.5–5.1)
Sodium: 138 mmol/L (ref 135–145)

## 2022-12-12 LAB — MAGNESIUM: Magnesium: 2 mg/dL (ref 1.7–2.4)

## 2022-12-12 NOTE — Discharge Summary (Signed)
PATIENT DETAILS Name: Debbie [name] Age: 72 y.o. Sex: female Date of Birth: 1951/05/31 MRN: [id number]. Admitting Physician: Clance Boll, MD [id number], Caren Griffins, MD  Admit Date: 12/10/2022 Discharge date: 12/12/2022  Recommendations for Outpatient Follow-up:  Follow up with PCP in 1-2 weeks Please obtain CMP/CBC in one week Please ensure follow-up with gastroenterology-May need another regimen for predominant IBS-as she has voluminous diarrhea with her current regimen. Diuretics have been held-cardiology recommending use as needed  Admitted From:  Home  Disposition: Home   Discharge Condition: good  CODE STATUS:   Code Status: Full Code   Diet recommendation:  Diet Order             Diet - low sodium heart healthy           Diet Heart Room service appropriate? Yes; Fluid consistency: Thin  Diet effective now                    Brief Summary: 72 year old with history of constipation predominant IBS, HTN, HFrEF-who presented to the hospital with syncope-this was in the setting of orthostatic hypotension following bowel purge for constipation predominant IBS.  Brief Hospital Course: Syncope due to orthostatic hypotension In the setting of diarrhea-after being treated for constipation predominant IBS Diarrheal symptoms have resolved-she is much better-and has ambulated in the hallway/room without any incidence/dizziness. Evaluated by cardiology-not felt to require any further workup.  Constipation predominant IBS Ensure outpatient follow-up with GI-May need another regimen for constipation-as she has had severe diarrhea with orthostatic hypotension.  Thankfully diarrheal symptoms have completely resolved.  HFrEF Euvolemic Per cardiology-use diuretics as needed  HTN Resume lisinopril-as her blood pressures creeping up.  Orthostatic symptoms have resolved.  HLD Continue statin  CAD No anginal symptoms Continue aspirin/statin  BMI: Estimated  body mass index is 28.71 kg/m as calculated from the following:   Height as of this encounter: '5\' 2"'$  (1.575 m).   Weight as of this encounter: 71.2 kg.    Discharge Diagnoses:  Principal Problem:   Syncope Active Problems:   Orthostatic syncope   Discharge Instructions:  Activity:  As tolerated  Discharge Instructions     Call MD for:  difficulty breathing, headache or visual disturbances   Complete by: As directed    Call MD for:  extreme fatigue   Complete by: As directed    Call MD for:  persistant nausea and vomiting   Complete by: As directed    Diet - low sodium heart healthy   Complete by: As directed    Discharge instructions   Complete by: As directed    Follow with Primary MD  Harlan Stains, MD in 1-2 weeks  Please get a complete blood count and chemistry panel checked by your Primary MD at your next visit, and again as instructed by your Primary MD.  Get Medicines reviewed and adjusted: Please take all your medications with you for your next visit with your Primary MD  Laboratory/radiological data: Please request your Primary MD to go over all hospital tests and procedure/radiological results at the follow up, please ask your Primary MD to get all Hospital records sent to his/her office.  In some cases, they will be blood work, cultures and biopsy results pending at the time of your discharge. Please request that your primary care M.D. follows up on these results.  Also Note the following: If you experience worsening of your admission symptoms, develop shortness of breath, life threatening emergency, suicidal or homicidal  thoughts you must seek medical attention immediately by calling 911 or calling your MD immediately  if symptoms less severe.  You must read complete instructions/literature along with all the possible adverse reactions/side effects for all the Medicines you take and that have been prescribed to you. Take any new Medicines after you have  completely understood and accpet all the possible adverse reactions/side effects.   Do not drive when taking Pain medications or sleeping medications (Benzodaizepines)  Do not take more than prescribed Pain, Sleep and Anxiety Medications. It is not advisable to combine anxiety,sleep and pain medications without talking with your primary care practitioner  Special Instructions: If you have smoked or chewed Tobacco  in the last 2 yrs please stop smoking, stop any regular Alcohol  and or any Recreational drug use.  Wear Seat belts while driving.  Please note: You were cared for by a hospitalist during your hospital stay. Once you are discharged, your primary care physician will handle any further medical issues. Please note that NO REFILLS for any discharge medications will be authorized once you are discharged, as it is imperative that you return to your primary care physician (or establish a relationship with a primary care physician if you do not have one) for your post hospital discharge needs so that they can reassess your need for medications and monitor your lab values.   Increase activity slowly   Complete by: As directed       Allergies as of 12/12/2022   No Known Allergies      Medication List     STOP taking these medications    erythromycin 250 MG EC tablet Commonly known as: ERY-TAB   furosemide 20 MG tablet Commonly known as: LASIX   potassium chloride 10 MEQ tablet Commonly known as: KLOR-CON M   spironolactone 25 MG tablet Commonly known as: Aldactone       TAKE these medications    aspirin EC 81 MG tablet Take 1 tablet (81 mg total) by mouth daily.   atorvastatin 80 MG tablet Commonly known as: LIPITOR Take 80 mg by mouth daily.   cetirizine 5 MG tablet Commonly known as: ZYRTEC Take 5 mg by mouth daily.   Cymbalta 60 MG capsule Generic drug: DULoxetine Take 120 mg by mouth at bedtime.   ezetimibe 10 MG tablet Commonly known as: ZETIA Take 1  tablet (10 mg total) by mouth daily.   fluticasone 50 MCG/ACT nasal spray Commonly known as: FLONASE Place 2 sprays into both nostrils daily as needed for allergies or rhinitis.   Gemtesa 75 MG Tabs Generic drug: Vibegron Take 75 mg by mouth daily.   lisinopril 10 MG tablet Commonly known as: ZESTRIL Take 10 mg by mouth daily.   Magnesium 500 MG Tabs Take 500 mg by mouth daily.   multivitamin capsule Take 1 capsule by mouth every morning.   pantoprazole 40 MG tablet Commonly known as: PROTONIX Take 40 mg by mouth 2 (two) times daily.   Vitamin D-3 25 MCG (1000 UT) Caps Take 1,000 Units by mouth daily.        Follow-up Information     Harlan Stains, MD. Schedule an appointment as soon as possible for a visit in 1 week(s).   Specialty: Family Medicine Contact information: [address], [place] [address] [phone]                No Known Allergies   Other Procedures/Studies: ECHOCARDIOGRAM COMPLETE  Result Date: 12/11/2022  ECHOCARDIOGRAM REPORT   Patient Name:   Debbie [name] Date of Exam: 12/11/2022 Medical Rec #:  [id number]            Height:       62.0 in Accession #:    [id number]           Weight:       154.0 lb Date of Birth:  1951/01/25             BSA:          28.711 m Patient Age:    18 years             BP:           123/53 mmHg Patient Gender: F                    HR:           79 bpm. Exam Location:  Inpatient Procedure: 2D Echo, Cardiac Doppler and Color Doppler Indications:    Syncope  History:        Patient has prior history of Echocardiogram examinations, most                 recent 08/30/2022. CAD; Risk Factors:Hypertension and                 Dyslipidemia.  Sonographer:    Clayton Lefort RDCS (AE) Referring Phys: Clance Boll  Sonographer Comments: Suboptimal subcostal window. IMPRESSIONS  1. Left ventricular ejection fraction, by estimation, is 50 to 55%. The left ventricle has low normal function. Left ventricular  endocardial border not optimally defined to evaluate regional wall motion. Left ventricular diastolic parameters are consistent with Grade I diastolic dysfunction (impaired relaxation).  2. Right ventricular systolic function was not well visualized. The right ventricular size is normal. Tricuspid regurgitation signal is inadequate for assessing PA pressure.  3. The mitral valve is degenerative. No evidence of mitral valve regurgitation. No evidence of mitral stenosis.  4. The aortic valve is tricuspid. There is mild calcification of the aortic valve. Aortic valve regurgitation is not visualized. No aortic stenosis is present. Comparison(s): No significant change from prior study. Prior images reviewed side by side. FINDINGS  Left Ventricle: Left ventricular ejection fraction, by estimation, is 50 to 55%. The left ventricle has low normal function. Left ventricular endocardial border not optimally defined to evaluate regional wall motion. The left ventricular internal cavity  size was normal in size. There is no left ventricular hypertrophy. Left ventricular diastolic parameters are consistent with Grade I diastolic dysfunction (impaired relaxation). Right Ventricle: The right ventricular size is normal. No increase in right ventricular wall thickness. Right ventricular systolic function was not well visualized. Tricuspid regurgitation signal is inadequate for assessing PA pressure. Left Atrium: Left atrial size was normal in size. Right Atrium: Right atrial size was normal in size. Pericardium: There is no evidence of pericardial effusion. Mitral Valve: The mitral valve is degenerative in appearance. There is severe calcification of the anterior mitral valve leaflet(s). No evidence of mitral valve regurgitation. No evidence of mitral valve stenosis. Tricuspid Valve: The tricuspid valve is normal in structure. Tricuspid valve regurgitation is trivial. No evidence of tricuspid stenosis. Aortic Valve: The aortic valve  is tricuspid. There is mild calcification of the aortic valve. Aortic valve regurgitation is not visualized. No aortic stenosis is present. Aortic valve mean gradient measures 3.0 mmHg. Aortic valve peak gradient measures 6.7 mmHg. Aortic valve area, by VTI measures  2.61 cm. Pulmonic Valve: The pulmonic valve was normal in structure. Pulmonic valve regurgitation is not visualized. No evidence of pulmonic stenosis. Aorta: The aortic root and ascending aorta are structurally normal, with no evidence of dilitation. Venous: The inferior vena cava was not well visualized. IAS/Shunts: The interatrial septum was not well visualized.  LEFT VENTRICLE PLAX 2D LVIDd:         4.90 cm     Diastology LVIDs:         3.50 cm     LV e' medial:    5.33 cm/s LV PW:         1.10 cm     LV E/e' medial:  12.4 LV IVS:        1.00 cm     LV e' lateral:   8.92 cm/s LVOT diam:     2.20 cm     LV E/e' lateral: 7.4 LV SV:         59 LV SV Index:   35 LVOT Area:     3.80 cm  LV Volumes (MOD) LV vol d, MOD A2C: 61.6 ml LV vol d, MOD A4C: 97.3 ml LV vol s, MOD A2C: 30.2 ml LV vol s, MOD A4C: 51.8 ml LV SV MOD A2C:     31.4 ml LV SV MOD A4C:     97.3 ml LV SV MOD BP:      39.3 ml RIGHT VENTRICLE RV Basal diam:  2.70 cm RV S prime:     16.90 cm/s TAPSE (M-mode): 1.7 cm LEFT ATRIUM             Index        RIGHT ATRIUM          Index LA diam:        2.80 cm 1.64 cm/m   RA Area:     7.17 cm LA Vol (A2C):   36.8 ml 21.51 ml/m  RA Volume:   11.20 ml 6.55 ml/m LA Vol (A4C):   49.2 ml 28.76 ml/m LA Biplane Vol: 43.9 ml 25.66 ml/m  AORTIC VALVE AV Area (Vmax):    2.60 cm AV Area (Vmean):   2.63 cm AV Area (VTI):     2.61 cm AV Vmax:           129.00 cm/s AV Vmean:          82.700 cm/s AV VTI:            0.227 m AV Peak Grad:      6.7 mmHg AV Mean Grad:      3.0 mmHg LVOT Vmax:         88.30 cm/s LVOT Vmean:        57.200 cm/s LVOT VTI:          0.156 m LVOT/AV VTI ratio: 0.69  AORTA Ao Root diam: 3.30 cm Ao Asc diam:  3.30 cm MITRAL VALVE MV  Area (PHT): 2.62 cm    SHUNTS MV Decel Time: 289 msec    Systemic VTI:  0.16 m MV E velocity: 66.00 cm/s  Systemic Diam: 2.20 cm MV A velocity: 83.20 cm/s MV E/A ratio:  0.79 Vishnu Priya Mallipeddi Electronically signed by Lorelee Cover Mallipeddi Signature Date/Time: 12/11/2022/12:06:21 PM    Final    Portable Chest 1 View  Result Date: 12/11/2022 CLINICAL DATA:  Syncope. EXAM: PORTABLE CHEST 1 VIEW COMPARISON:  09/14/2020. FINDINGS: The heart size and mediastinal contours are within normal limits. Lung volumes are low with  elevation of the right diaphragm. There is atelectasis at the lung bases bilaterally. No effusion or pneumothorax. No acute osseous abnormality. IMPRESSION: Low lung volumes with atelectasis at the lung bases. Electronically Signed   By: Brett Fairy M.D.   On: 12/11/2022 04:32   NM GASTRIC EMPTYING  Result Date: 12/01/2022 CLINICAL DATA:  Early satiety, bloating, concern for gastroparesis EXAM: NUCLEAR MEDICINE GASTRIC EMPTYING SCAN TECHNIQUE: After oral ingestion of radiolabeled meal, sequential abdominal images were obtained for 4 hours. Percentage of activity emptying the stomach was calculated at 1 hour, 2 hour, 3 hour, and 4 hours. RADIOPHARMACEUTICALS:  2.2 mCi Tc-39msulfur colloid in standardized meal COMPARISON:  CT abdomen pelvis November 24, 2022 FINDINGS: Expected location of the stomach in the left upper quadrant. Ingested meal empties the stomach minimally over the course of the study. 2% emptied at 1 hr ( normal >= 10%) 5% emptied at 2 hr ( normal >= 40%) 8% emptied at 3 hr ( normal >= 70%) 10% emptied at 4 hr ( normal >= 90%) IMPRESSION: Scintigraphic findings compatible with delayed gastric emptying. Electronically Signed   By: JDahlia BailiffM.D.   On: 12/01/2022 12:29   MM 3D SCREEN BREAST BILATERAL  Result Date: 11/30/2022 CLINICAL DATA:  Screening. EXAM: DIGITAL SCREENING BILATERAL MAMMOGRAM WITH TOMOSYNTHESIS AND CAD TECHNIQUE: Bilateral screening digital  craniocaudal and mediolateral oblique mammograms were obtained. Bilateral screening digital breast tomosynthesis was performed. The images were evaluated with computer-aided detection. COMPARISON:  Previous exam(s). ACR Breast Density Category b: There are scattered areas of fibroglandular density. FINDINGS: There are no findings suspicious for malignancy. IMPRESSION: No mammographic evidence of malignancy. A result letter of this screening mammogram will be mailed directly to the patient. RECOMMENDATION: Screening mammogram in one year. (Code:SM-B-01Y) BI-RADS CATEGORY  1: Negative. Electronically Signed   By: DLillia MountainM.D.   On: 11/30/2022 13:36   CT ABDOMEN PELVIS W CONTRAST  Result Date: 11/24/2022 CLINICAL DATA:  Abdominal pain, RIGHT lower quadrant pain, postprandial pain, nausea, bloating, constipation, and diarrhea for 6 months EXAM: CT ABDOMEN AND PELVIS WITH CONTRAST TECHNIQUE: Multidetector CT imaging of the abdomen and pelvis was performed using the standard protocol following bolus administration of intravenous contrast. RADIATION DOSE REDUCTION: This exam was performed according to the departmental dose-optimization program which includes automated exposure control, adjustment of the mA and/or kV according to patient size and/or use of iterative reconstruction technique. CONTRAST:  1058mISOVUE-300 IOPAMIDOL (ISOVUE-300) INJECTION 61% IV. Dilute oral contrast. COMPARISON:  09/14/2020 FINDINGS: Lower chest: Lung bases clear Hepatobiliary: 10 x 12 mm intermediate attenuation lesion medial RIGHT lobe liver image 26, decreased in size from the 24 x 19 mm diameter cyst seen at this site on the previous exam consistent with ruptured cyst. No abnormal enhancement or worrisome features; no follow-up imaging recommended. Gallbladder and liver otherwise unremarkable. Pancreas: Normal appearance Spleen: Normal appearance Adrenals/Urinary Tract: Adrenal glands normal appearance. Tiny BILATERAL simple renal  cysts less than 1 cm diameter; no follow-up imaging recommended. No additional renal mass, hydronephrosis, hydroureter, or urinary tract calcification. Bladder decompressed, low-lying Stomach/Bowel: Normal appendix. Large hiatal hernia. Low descent of rectum in pelvis with associated contour abnormality at the anus suspicious for rectal prolapse, recommend correlation with physical exam. Pelvic floor relaxation with small-bowel loops descending far inferiorly within the cul-de-sac. Sigmoid diverticulosis without evidence of diverticulitis. No hernia or definite inflammatory process. Remaining bowel loops unremarkable. Vascular/Lymphatic: Osseous demineralization. Reproductive: Uterus surgically absent.  Atrophic ovaries. Other: No free air or free fluid. No  hernia or inflammatory process. Musculoskeletal: Osseous demineralization. 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 Low descent of rectum in pelvis with associated contour abnormality at the anus suspicious for rectal prolapse, recommend correlation with physical exam. Pelvic floor relaxation with small-bowel loops descending far inferiorly within the cul-de-sac. Sigmoid diverticulosis without evidence of diverticulitis. No acute intra-abdominal or intrapelvic abnormalities. Aortic Atherosclerosis (ICD10-I70.0). Electronically Signed   By: Lavonia Dana M.D.   On: 11/24/2022 12:09     TODAY-DAY OF DISCHARGE:  Subjective:   Debbie [name] today has no headache,no chest abdominal pain,no new weakness tingling or numbness, feels much better wants to go home today.   Objective:   Blood pressure (!) 154/68, pulse 82, temperature (!) 97.5 F (36.4 C), temperature source Oral, resp. rate 20, height '5\' 2"'$  (1.575 m), weight 71.2 kg, SpO2 96 %.  Intake/Output Summary (Last 24 hours) at 12/12/2022 0902 Last data filed at 12/12/2022 0329 Gross per 24 hour  Intake --  Output 400 ml  Net -400 ml   Filed  Weights   12/11/22 0003 12/11/22 2204  Weight: 69.9 kg 71.2 kg    Exam: Awake Alert, Oriented *3, No new F.N deficits, Normal affect [place].AT,PERRAL Supple Neck,No JVD, No cervical lymphadenopathy appriciated.  Symmetrical Chest wall movement, Good air movement bilaterally, CTAB RRR,No Gallops,Rubs or new Murmurs, No Parasternal Heave +ve B.Sounds, Abd Soft, Non tender, No organomegaly appriciated, No rebound -guarding or rigidity. No Cyanosis, Clubbing or edema, No new Rash or bruise   PERTINENT RADIOLOGIC STUDIES: ECHOCARDIOGRAM COMPLETE  Result Date: 12/11/2022    ECHOCARDIOGRAM REPORT   Patient Name:   Debbie [name] Date of Exam: 12/11/2022 Medical Rec #:  [id number]            Height:       62.0 in Accession #:    [id number]           Weight:       154.0 lb Date of Birth:  January 11, 1951             BSA:          70.711 m Patient Age:    10 years             BP:           123/53 mmHg Patient Gender: F                    HR:           79 bpm. Exam Location:  Inpatient Procedure: 2D Echo, Cardiac Doppler and Color Doppler Indications:    Syncope  History:        Patient has prior history of Echocardiogram examinations, most                 recent 08/30/2022. CAD; Risk Factors:Hypertension and                 Dyslipidemia.  Sonographer:    Clayton Lefort RDCS (AE) Referring Phys: Clance Boll  Sonographer Comments: Suboptimal subcostal window. IMPRESSIONS  1. Left ventricular ejection fraction, by estimation, is 50 to 55%. The left ventricle has low normal function. Left ventricular endocardial border not optimally defined to evaluate regional wall motion. Left ventricular diastolic parameters are consistent with Grade I diastolic dysfunction (impaired relaxation).  2. Right ventricular systolic function was not well visualized. The right ventricular size is normal. Tricuspid regurgitation signal is inadequate for assessing PA pressure.  3. The mitral valve is degenerative. No evidence of mitral valve  regurgitation. No evidence of mitral stenosis.  4. The aortic valve is tricuspid. There is mild calcification of the aortic valve. Aortic valve regurgitation is not visualized. No aortic stenosis is present. Comparison(s): No significant change from prior study. Prior images reviewed side by side. FINDINGS  Left Ventricle: Left ventricular ejection fraction, by estimation, is 50 to 55%. The left ventricle has low normal function. Left ventricular endocardial border not optimally defined to evaluate regional wall motion. The left ventricular internal cavity  size was normal in size. There is no left ventricular hypertrophy. Left ventricular diastolic parameters are consistent with Grade I diastolic dysfunction (impaired relaxation). Right Ventricle: The right ventricular size is normal. No increase in right ventricular wall thickness. Right ventricular systolic function was not well visualized. Tricuspid regurgitation signal is inadequate for assessing PA pressure. Left Atrium: Left atrial size was normal in size. Right Atrium: Right atrial size was normal in size. Pericardium: There is no evidence of pericardial effusion. Mitral Valve: The mitral valve is degenerative in appearance. There is severe calcification of the anterior mitral valve leaflet(s). No evidence of mitral valve regurgitation. No evidence of mitral valve stenosis. Tricuspid Valve: The tricuspid valve is normal in structure. Tricuspid valve regurgitation is trivial. No evidence of tricuspid stenosis. Aortic Valve: The aortic valve is tricuspid. There is mild calcification of the aortic valve. Aortic valve regurgitation is not visualized. No aortic stenosis is present. Aortic valve mean gradient measures 3.0 mmHg. Aortic valve peak gradient measures 6.7 mmHg. Aortic valve area, by VTI measures 2.61 cm. Pulmonic Valve: The pulmonic valve was normal in structure. Pulmonic valve regurgitation is not visualized. No evidence of pulmonic stenosis. Aorta:  The aortic root and ascending aorta are structurally normal, with no evidence of dilitation. Venous: The inferior vena cava was not well visualized. IAS/Shunts: The interatrial septum was not well visualized.  LEFT VENTRICLE PLAX 2D LVIDd:         4.90 cm     Diastology LVIDs:         3.50 cm     LV e' medial:    5.33 cm/s LV PW:         1.10 cm     LV E/e' medial:  12.4 LV IVS:        1.00 cm     LV e' lateral:   8.92 cm/s LVOT diam:     2.20 cm     LV E/e' lateral: 7.4 LV SV:         59 LV SV Index:   35 LVOT Area:     3.80 cm  LV Volumes (MOD) LV vol d, MOD A2C: 61.6 ml LV vol d, MOD A4C: 97.3 ml LV vol s, MOD A2C: 30.2 ml LV vol s, MOD A4C: 51.8 ml LV SV MOD A2C:     31.4 ml LV SV MOD A4C:     97.3 ml LV SV MOD BP:      39.3 ml RIGHT VENTRICLE RV Basal diam:  2.70 cm RV S prime:     16.90 cm/s TAPSE (M-mode): 1.7 cm LEFT ATRIUM             Index        RIGHT ATRIUM          Index LA diam:        2.80 cm 1.64 cm/m   RA Area:     7.17 cm LA Vol (  A2C):   36.8 ml 21.51 ml/m  RA Volume:   11.20 ml 6.55 ml/m LA Vol (A4C):   49.2 ml 28.76 ml/m LA Biplane Vol: 43.9 ml 25.66 ml/m  AORTIC VALVE AV Area (Vmax):    2.60 cm AV Area (Vmean):   2.63 cm AV Area (VTI):     2.61 cm AV Vmax:           129.00 cm/s AV Vmean:          82.700 cm/s AV VTI:            0.227 m AV Peak Grad:      6.7 mmHg AV Mean Grad:      3.0 mmHg LVOT Vmax:         88.30 cm/s LVOT Vmean:        57.200 cm/s LVOT VTI:          0.156 m LVOT/AV VTI ratio: 0.69  AORTA Ao Root diam: 3.30 cm Ao Asc diam:  3.30 cm MITRAL VALVE MV Area (PHT): 2.62 cm    SHUNTS MV Decel Time: 289 msec    Systemic VTI:  0.16 m MV E velocity: 66.00 cm/s  Systemic Diam: 2.20 cm MV A velocity: 83.20 cm/s MV E/A ratio:  0.79 Vishnu Priya Mallipeddi Electronically signed by Lorelee Cover Mallipeddi Signature Date/Time: 12/11/2022/12:06:21 PM    Final    Portable Chest 1 View  Result Date: 12/11/2022 CLINICAL DATA:  Syncope. EXAM: PORTABLE CHEST 1 VIEW COMPARISON:   09/14/2020. FINDINGS: The heart size and mediastinal contours are within normal limits. Lung volumes are low with elevation of the right diaphragm. There is atelectasis at the lung bases bilaterally. No effusion or pneumothorax. No acute osseous abnormality. IMPRESSION: Low lung volumes with atelectasis at the lung bases. Electronically Signed   By: Brett Fairy M.D.   On: 12/11/2022 04:32     PERTINENT LAB RESULTS: CBC: Recent Labs    12/11/22 0002 12/11/22 0400  WBC 11.7* 8.9  HGB 14.0 12.2  HCT 44.1 38.5  PLT 358 305   CMET CMP     Component Value Date/Time   NA 138 12/12/2022 0043   NA 144 11/04/2022 1026   K 3.9 12/12/2022 0043   CL 105 12/12/2022 0043   CO2 28 12/12/2022 0043   GLUCOSE 115 (H) 12/12/2022 0043   BUN 12 12/12/2022 0043   BUN 13 11/04/2022 1026   CREATININE 0.71 12/12/2022 0043   CALCIUM 8.3 (L) 12/12/2022 0043   PROT 6.7 09/04/2020 0855   ALBUMIN 4.4 09/04/2020 0855   AST 22 09/04/2020 0855   ALT 25 11/10/2020 1117   ALKPHOS 81 09/04/2020 0855   BILITOT 0.3 09/04/2020 0855   GFRNONAA >60 12/12/2022 0043   GFRAA 70 09/11/2020 1045    GFR Estimated Creatinine Clearance: 61.3 mL/min (by C-G formula based on SCr of 0.71 mg/dL). No results for input(s): "LIPASE", "AMYLASE" in the last 72 hours. No results for input(s): "CKTOTAL", "CKMB", "CKMBINDEX", "TROPONINI" in the last 72 hours. Invalid input(s): "POCBNP" Recent Labs    12/11/22 0002  DDIMER 1.03*   No results for input(s): "HGBA1C" in the last 72 hours. No results for input(s): "CHOL", "HDL", "LDLCALC", "TRIG", "CHOLHDL", "LDLDIRECT" in the last 72 hours. No results for input(s): "TSH", "T4TOTAL", "T3FREE", "THYROIDAB" in the last 72 hours.  Invalid input(s): "FREET3" No results for input(s): "VITAMINB12", "FOLATE", "FERRITIN", "TIBC", "IRON", "RETICCTPCT" in the last 72 hours. Coags: No results for input(s): "INR" in the last 72 hours.  Invalid input(s): "PT" Microbiology: Recent  Results (from the past 240 hour(s))  Respiratory (~20 pathogens) panel by PCR     Status: Abnormal   Collection Time: 12/11/22  5:30 AM   Specimen: Nasopharyngeal Swab; Respiratory  Result Value Ref Range Status   Adenovirus NOT DETECTED NOT DETECTED Final   Coronavirus 229E NOT DETECTED NOT DETECTED Final    Comment: (NOTE) The Coronavirus on the Respiratory Panel, DOES NOT test for the novel  Coronavirus (2019 nCoV)    Coronavirus HKU1 NOT DETECTED NOT DETECTED Final   Coronavirus NL63 NOT DETECTED NOT DETECTED Final   Coronavirus OC43 NOT DETECTED NOT DETECTED Final   Metapneumovirus NOT DETECTED NOT DETECTED Final   Rhinovirus / Enterovirus NOT DETECTED NOT DETECTED Final   Influenza A NOT DETECTED NOT DETECTED Final   Influenza B DETECTED (A) NOT DETECTED Final   Parainfluenza Virus 1 NOT DETECTED NOT DETECTED Final   Parainfluenza Virus 2 NOT DETECTED NOT DETECTED Final   Parainfluenza Virus 3 NOT DETECTED NOT DETECTED Final   Parainfluenza Virus 4 NOT DETECTED NOT DETECTED Final   Respiratory Syncytial Virus NOT DETECTED NOT DETECTED Final   Bordetella pertussis NOT DETECTED NOT DETECTED Final   Bordetella Parapertussis NOT DETECTED NOT DETECTED Final   Chlamydophila pneumoniae NOT DETECTED NOT DETECTED Final   Mycoplasma pneumoniae NOT DETECTED NOT DETECTED Final    Comment: Performed at [facility], [place]. [address] [place], [place] [address]    FURTHER DISCHARGE INSTRUCTIONS:  Get Medicines reviewed and adjusted: Please take all your medications with you for your next visit with your Primary MD  Laboratory/radiological data: Please request your Primary MD to go over all hospital tests and procedure/radiological results at the follow up, please ask your Primary MD to get all Hospital records sent to his/her office.  In some cases, they will be blood work, cultures and biopsy results pending at the time of your discharge. Please request that your primary care M.D.  goes through all the records of your hospital data and follows up on these results.  Also Note the following: If you experience worsening of your admission symptoms, develop shortness of breath, life threatening emergency, suicidal or homicidal thoughts you must seek medical attention immediately by calling 911 or calling your MD immediately  if symptoms less severe.  You must read complete instructions/literature along with all the possible adverse reactions/side effects for all the Medicines you take and that have been prescribed to you. Take any new Medicines after you have completely understood and accpet all the possible adverse reactions/side effects.   Do not drive when taking Pain medications or sleeping medications (Benzodaizepines)  Do not take more than prescribed Pain, Sleep and Anxiety Medications. It is not advisable to combine anxiety,sleep and pain medications without talking with your primary care practitioner  Special Instructions: If you have smoked or chewed Tobacco  in the last 2 yrs please stop smoking, stop any regular Alcohol  and or any Recreational drug use.  Wear Seat belts while driving.  Please note: You were cared for by a hospitalist during your hospital stay. Once you are discharged, your primary care physician will handle any further medical issues. Please note that NO REFILLS for any discharge medications will be authorized once you are discharged, as it is imperative that you return to your primary care physician (or establish a relationship with a primary care physician if you do not have one) for your post hospital discharge needs so that they  can reassess your need for medications and monitor your lab values.  Total Time spent coordinating discharge including counseling, education and face to face time equals greater than 30 minutes.  SignedOren Binet 12/12/2022 9:02 AM

## 2022-12-12 NOTE — Care Management Obs Status (Signed)
[facility] NOTIFICATION   Patient Details  Name: Debbie [name] MRN: [id number] Date of Birth: 04/29/1951   Medicare Observation Status Notification Given:  Yes    Zenon Mayo, RN 12/12/2022, 10:08 AM

## 2022-12-12 NOTE — TOC Transition Note (Signed)
Transition of Care [facility]) - CM/SW Discharge Note   Patient Details  Name: Jocee Kissick MRN: [id number] Date of Birth: 06/18/1951  [facility] CM/SW Contact:  Zenon Mayo, RN Phone Number: 12/12/2022, 10:15 AM   Clinical Narrative:     Patient  is for dc home today, has no needs.        Patient Goals and CMS Choice      Discharge Placement                         Discharge Plan and Services Additional resources added to the After Visit Summary for                                       Social Determinants of Health (SDOH) Interventions SDOH Screenings   Food Insecurity: No Food Insecurity (12/11/2022)  Housing: Low Risk  (12/11/2022)  Transportation Needs: No Transportation Needs (12/11/2022)  Utilities: Not At Risk (12/11/2022)  Tobacco Use: Low Risk  (11/30/2022)     Readmission Risk Interventions     No data to display

## 2022-12-12 NOTE — Progress Notes (Signed)
   12/12/22 1000  Mobility  Activity Ambulated independently in hallway  Level of Assistance Independent  Assistive Device None  Distance Ambulated (ft) 300 ft  Activity Response Tolerated well  Mobility Referral Yes  $Mobility charge 1 Mobility   Mobility Specialist Progress Note  Received pt in bed having no complaints and agreeable to mobility. Pt was asymptomatic throughout ambulation and returned to room w/o fault. Left in bed w/ call bell in reach and all needs met.   Lucious Groves Mobility Specialist  Please contact via SecureChat or Rehab office at [phone]

## 2022-12-12 NOTE — Care Management CC44 (Signed)
Condition Code 44 Documentation Completed  Patient Details  Name: Debbie [name] MRN: [id number] Date of Birth: 1950-12-11   Condition Code 44 given:  Yes Patient signature on Condition Code 44 notice:  Yes Documentation of 2 MD's agreement:  Yes Code 44 added to claim:  Yes    Zenon Mayo, RN 12/12/2022, 10:09 AM

## 2022-12-12 NOTE — Evaluation (Signed)
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 PMH includes HTN, GERD, CAD, anxiety.   Clinical Impression   Pt reports independence at baseline with ADLs and functional mobility, lives with family who can assist at d/c. Pt currently needing supervision for ADLs, Mod I for bed mobility, and supervision for transfers without AD. Pt BP WNL throughout session 156/86 (106) pre-mobility and 166/82 (105) post-mobility in room. Pt presenting with impairments listed below, will follow acutely. Anticipate no OT follow up needs at d/c.     Recommendations for follow up therapy are one component of a multi-disciplinary discharge planning process, led by the attending physician.  Recommendations may be updated based on patient status, additional functional criteria and insurance authorization.   Follow Up Recommendations  No OT follow up     Assistance Recommended at Discharge Set up Supervision/Assistance  Patient can return home with the following A little help with walking and/or transfers;A little help with bathing/dressing/bathroom;Assistance with cooking/housework;Assist for transportation;Help with stairs or ramp for entrance    Functional Status Assessment  Patient has had a recent decline in their functional status and demonstrates the ability to make significant improvements in function in a reasonable and predictable amount of time.  Equipment Recommendations  None recommended by OT (pt has all needed DME)    Recommendations for Other Services PT consult     Precautions / Restrictions Precautions Precautions: Fall Restrictions Weight Bearing Restrictions: No      Mobility Bed Mobility Overal bed mobility: Modified Independent                   Transfers Overall transfer level: Needs assistance Equipment used: None Transfers: Sit to/from Stand Sit to Stand: Supervision                  Balance Overall balance assessment: Mild deficits observed, not formally tested                                         ADL either performed or assessed with clinical judgement   ADL Overall ADL's : Needs assistance/impaired Eating/Feeding: Supervision/ safety   Grooming: Supervision/safety   Upper Body Bathing: Supervision/ safety   Lower Body Bathing: Supervison/ safety   Upper Body Dressing : Supervision/safety   Lower Body Dressing: Supervision/safety   Toilet Transfer: Supervision/safety;Ambulation;Regular Museum/gallery exhibitions officer and Hygiene: Supervision/safety   Tub/ Banker: Supervision/safety   Functional mobility during ADLs: Supervision/safety       Vision   Vision Assessment?: No apparent visual deficits     Agricultural engineer Tested?: No   Praxis Praxis Praxis tested?: Not tested    Pertinent Vitals/Pain Pain Assessment Pain Assessment: No/denies pain     Hand Dominance Right   Extremity/Trunk Assessment Upper Extremity Assessment Upper Extremity Assessment: Overall WFL for tasks assessed   Lower Extremity Assessment Lower Extremity Assessment: Defer to PT evaluation   Cervical / Trunk Assessment Cervical / Trunk Assessment: Normal   Communication     Cognition Arousal/Alertness: Awake/alert Behavior During Therapy: WFL for tasks assessed/performed Overall Cognitive Status: Within Functional Limits for tasks assessed  General Comments  HR 100-130 with standing grooming task    Exercises     Shoulder Instructions      Home Living Family/patient expects to be discharged to:: Private residence Living Arrangements: Spouse/significant other;Children Available Help at  Discharge: Family;Available PRN/intermittently Type of Home: House Home Access: Stairs to enter [employer] of Steps: 6 Entrance Stairs-Rails: Can reach both Home Layout: One level     Bathroom Shower/Tub: Walk-in shower         Home Equipment: Shower seat;None          Prior Functioning/Environment Prior Level of Function : Independent/Modified Independent             Mobility Comments: no AD use ADLs Comments: ind        OT Problem List: Decreased strength;Decreased range of motion;Decreased activity tolerance;Impaired balance (sitting and/or standing)      OT Treatment/Interventions: Self-care/ADL training;Therapeutic exercise;Energy conservation;DME and/or AE instruction;Therapeutic activities;Patient/family education;Balance training    OT Goals(Current goals can be found in the care plan section) Acute Rehab OT Goals Patient Stated Goal: to go home OT Goal Formulation: With patient Time For Goal Achievement: 12/26/22 Potential to Achieve Goals: Good ADL Goals Pt Will Perform Lower Body Dressing: Independently;sit to/from stand;sitting/lateral leans Pt Will Perform Tub/Shower Transfer: Shower transfer;ambulating;shower seat;Independently Additional ADL Goal #1: pt will be able to stand x10 min for functional task in order to improve activity tolerance for ADLs  OT Frequency: Min 2X/week    Co-evaluation              AM-PAC OT "6 Clicks" Daily Activity     Outcome Measure Help from another person eating meals?: None Help from another person taking care of personal grooming?: None Help from another person toileting, which includes using toliet, bedpan, or urinal?: A Little Help from another person bathing (including washing, rinsing, drying)?: A Little Help from another person to put on and taking off regular upper body clothing?: A Little Help from another person to put on and taking off regular lower body clothing?: A Little 6 Click  Score: 20   End of Session Nurse Communication: Mobility status  Activity Tolerance: Patient tolerated treatment well Patient left: in bed;with call bell/phone within reach  OT Visit Diagnosis: Unsteadiness on feet (R26.81);Other abnormalities of gait and mobility (R26.89);Muscle weakness (generalized) (M62.81)                Time: 0109-3235 OT Time Calculation (min): 19 min Charges:  OT General Charges $OT Visit: 1 Visit OT Evaluation $OT Eval Low Complexity: 1 Low  Debbie [name], OTD, OTR/L SecureChat Preferred Acute Rehab [phone]   Debbie [name] 12/12/2022, 8:22 AM

## 2022-12-12 NOTE — Plan of Care (Signed)
Discharge instructions discussed with patient.  Patient instructed on home medications, restrictions, and follow up appointments. Belongings gathered and sent with patient.  No scripts for medications.    Patient discharged via wheelchair by this Probation officer.

## 2022-12-15 ENCOUNTER — Telehealth: Payer: Self-pay | Admitting: Cardiology

## 2022-12-15 LAB — NOROVIRUS GROUP 1 & 2 BY PCR, STOOL
Norovirus 1 by PCR: NEGATIVE
Norovirus 2  by PCR: NEGATIVE

## 2022-12-15 NOTE — Telephone Encounter (Signed)
Pt c/o Shortness Of Breath: STAT if SOB developed within the last 24 hours or pt is noticeably SOB on the phone  1. Are you currently SOB (can you hear that pt is SOB on the phone)? No   2. How long have you been experiencing SOB? Since discharge   3. Are you SOB when sitting or when up moving around? Moving around   4. Are you currently experiencing any other symptoms? Pt calling after being discharged she had some trouble breathing. She states during her stay Dr. Quentin Ore took her off of spironolactone and lasix but she thinks she should be on something to help her breathing. Please advise.

## 2022-12-15 NOTE — Telephone Encounter (Signed)
Called pt in regards to SOB.  Pt reports has not had lasix or spironolactone since ED visit on 12/10/22 for orthostatic syncope.  Meds were stopped.  Pt feels syncope was r/t dehydration.   Last BP 177/72.  Does not have log of daily weights.  But reports has not had significant weight gain.  Has  BLE  swelling let foot greater than right.  Is noticeably SOB over the phone.   Advised pt take Lasix 20 mg now.  Pt is agreeable advised will send message Will send to Johnson, NP to follow up.

## 2022-12-16 MED ORDER — FUROSEMIDE 20 MG PO TABS: 20.0000 mg | ORAL_TABLET | Freq: Every day | ORAL | 3 refills | Status: DC

## 2022-12-16 NOTE — Telephone Encounter (Signed)
Her kidney function and electrolytes are stable. Please her her continue Lasix 20 mg daily and record daily blood pressure readings taken at least 2 hours after she takes her medications.  Please have her record her weight daily. Limit dietary sodium to < 2000 mg daily. Call back with these numbers on Monday for further guidance.

## 2022-12-16 NOTE — Telephone Encounter (Signed)
Called patient back with Michelle's recommendations. Patient verbalized understanding. Added Lasix back to her list and she will call with BP and daily wts on Monday.

## 2022-12-16 NOTE — Telephone Encounter (Signed)
Pt returning call

## 2022-12-16 NOTE — Telephone Encounter (Signed)
Left message for patient to call back.

## 2022-12-22 DIAGNOSIS — K3184 Gastroparesis: Secondary | ICD-10-CM | POA: Diagnosis not present

## 2022-12-22 DIAGNOSIS — R55 Syncope and collapse: Secondary | ICD-10-CM | POA: Diagnosis not present

## 2022-12-22 DIAGNOSIS — N329 Bladder disorder, unspecified: Secondary | ICD-10-CM | POA: Diagnosis not present

## 2022-12-22 DIAGNOSIS — K581 Irritable bowel syndrome with constipation: Secondary | ICD-10-CM | POA: Diagnosis not present

## 2022-12-22 DIAGNOSIS — K5909 Other constipation: Secondary | ICD-10-CM | POA: Diagnosis not present

## 2022-12-22 DIAGNOSIS — I5040 Unspecified combined systolic (congestive) and diastolic (congestive) heart failure: Secondary | ICD-10-CM | POA: Diagnosis not present

## 2022-12-22 DIAGNOSIS — R3915 Urgency of urination: Secondary | ICD-10-CM | POA: Diagnosis not present

## 2022-12-22 DIAGNOSIS — I951 Orthostatic hypotension: Secondary | ICD-10-CM | POA: Diagnosis not present

## 2022-12-26 DIAGNOSIS — K641 Second degree hemorrhoids: Secondary | ICD-10-CM | POA: Diagnosis not present

## 2022-12-27 ENCOUNTER — Other Ambulatory Visit: Payer: Self-pay

## 2022-12-27 MED ORDER — FUROSEMIDE 20 MG PO TABS: 20.0000 mg | ORAL_TABLET | Freq: Every day | ORAL | 2 refills | Status: DC

## 2022-12-27 NOTE — Telephone Encounter (Signed)
Received faxed refill request for furosemide and Klor-con [id number] from [facility]  Will route refill request to refill pool to be addressed.

## 2022-12-29 ENCOUNTER — Telehealth: Payer: Self-pay | Admitting: Nurse Practitioner

## 2022-12-29 MED ORDER — FUROSEMIDE 20 MG PO TABS: 20.0000 mg | ORAL_TABLET | Freq: Every day | ORAL | 3 refills | Status: DC

## 2022-12-29 NOTE — Telephone Encounter (Signed)
Refill for Lasix has been sent to [facility].

## 2022-12-29 NOTE — Telephone Encounter (Signed)
*  STAT* If patient is at the pharmacy, call can be transferred to refill team.   1. Which medications need to be refilled? (please list name of each medication and dose if known)  furosemide (LASIX) 20 MG tablet  Potassium Chloride 10 meq  2. Which pharmacy/location (including street and city if local pharmacy) is medication to be sent to?  [place], [place]   3. Do they need a 30 day or 90 day supply? 90 day

## 2023-01-18 ENCOUNTER — Other Ambulatory Visit: Payer: Self-pay | Admitting: Ophthalmology

## 2023-01-18 DIAGNOSIS — D414 Neoplasm of uncertain behavior of bladder: Secondary | ICD-10-CM | POA: Diagnosis not present

## 2023-01-18 DIAGNOSIS — D303 Benign neoplasm of bladder: Secondary | ICD-10-CM | POA: Diagnosis not present

## 2023-01-26 DIAGNOSIS — N8111 Cystocele, midline: Secondary | ICD-10-CM | POA: Diagnosis not present

## 2023-01-26 DIAGNOSIS — D414 Neoplasm of uncertain behavior of bladder: Secondary | ICD-10-CM | POA: Diagnosis not present

## 2023-02-15 ENCOUNTER — Encounter: Payer: Self-pay | Admitting: Internal Medicine

## 2023-02-23 DIAGNOSIS — J019 Acute sinusitis, unspecified: Secondary | ICD-10-CM | POA: Diagnosis not present

## 2023-03-01 ENCOUNTER — Ambulatory Visit: Payer: Medicare HMO | Admitting: Cardiology

## 2023-03-03 ENCOUNTER — Inpatient Hospital Stay: Admission: RE | Admit: 2023-03-03 | Payer: Medicare HMO | Source: Ambulatory Visit

## 2023-03-23 DIAGNOSIS — J0110 Acute frontal sinusitis, unspecified: Secondary | ICD-10-CM | POA: Diagnosis not present

## 2023-03-23 DIAGNOSIS — I110 Hypertensive heart disease with heart failure: Secondary | ICD-10-CM | POA: Diagnosis not present

## 2023-03-23 DIAGNOSIS — N3941 Urge incontinence: Secondary | ICD-10-CM | POA: Diagnosis not present

## 2023-03-23 DIAGNOSIS — K581 Irritable bowel syndrome with constipation: Secondary | ICD-10-CM | POA: Diagnosis not present

## 2023-03-23 DIAGNOSIS — F419 Anxiety disorder, unspecified: Secondary | ICD-10-CM | POA: Diagnosis not present

## 2023-03-23 DIAGNOSIS — I5040 Unspecified combined systolic (congestive) and diastolic (congestive) heart failure: Secondary | ICD-10-CM | POA: Diagnosis not present

## 2023-03-23 DIAGNOSIS — F324 Major depressive disorder, single episode, in partial remission: Secondary | ICD-10-CM | POA: Diagnosis not present

## 2023-03-31 ENCOUNTER — Telehealth: Payer: Self-pay | Admitting: Cardiology

## 2023-03-31 NOTE — Telephone Encounter (Signed)
Pt c/o of Chest Pain: STAT if CP now or developed within 24 hours  1. Are you having CP right now? No  2. Are you experiencing any other symptoms (ex. SOB, nausea, vomiting, sweating)? SOB, Fatigue, feels dehydrated   3. How long have you been experiencing CP? A month  4. Is your CP continuous or coming and going? Coming and going, when the patient is moving around or walking  5. Have you taken Nitroglycerin? No  Patient stated they get very SOB especially when walking and moving around. Patient stated when moving around they will feel pressure in their chest. Please advise. ?

## 2023-03-31 NOTE — Telephone Encounter (Signed)
Patient reports increased fatigue, SOB and sometimes chest pressure with activity. She states she was hospitalized in January for dehydration and was taken off some of her diuretics. She also states she saw her PCP on 4/18 and was advised to follow up with cardiology.   Scheduled patient with APP on 03/14/23. Provided education on when to go to ED or call 911. Patient verbalized understanding and had no questions.

## 2023-04-12 NOTE — Progress Notes (Unsigned)
Cardiology Office Note:    Date:  04/13/2023   ID:  Debbie [name], DOB [birth year], MRN [id number]  PCP:  Laurann Montana, MD   [facility] Providers Cardiologist:  Armanda Magic, MD     Referring MD: Laurann Montana, MD   Chief Complaint: shortness of breath  History of Present Illness:    [name] is a very pleasant 72 y.o. female with a hx of HTN, HLD, family history of heart disease, abnormal EKG in 2019 showing normal sinus rhythm with diffuse T wave abnormality in anterior precordial leads and lateral leads.  Nuclear stress test in 2021 showed mild ST depression less than 1 mm at rest, low risk study.   Seen by Dr. Mayford Knife 09/2019 with dizziness and presyncope after standing up.  Blood pressure was low and lisinopril/HCTZ was cut back to 1 tablet daily.  She continued to have shortness of breath likely 2/2 deconditioning but due to her risk factors coronary CTA was performed.  CT 11/07/2019 revealed calcium score 146 (84th percentile for age/sex).  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  Aspirin was added.  Telemedicine visit on 12/18/2019 with Jacolyn Reedy, PA at which time she reported no further dizziness.  BP was normal. One year follow-up was recommended.   Seen by me on 08/18/22 and reported increased DOE over the previous 6 months. Able to complete grocery shopping without stopping but unable to unload all groceries up and down 7 stairs without resting. Difficulty getting her breath when going to urinate during the night  No increase in # of pillows needed and no PND or edema. No presyncope, syncope. Described chest pressure on occasion. No palpitations. 2D echo 08/30/22 revealed mildly reduced LV function 45 to 50% which was a decline from normal function on previous echo 09/2020. Started Farxiga 10 mg daily with stable follow-up bmet on 09/28/22.   Seen by me on  10/26/22 for follow-up. Unfortunately she had to stop [place] because of yeast infection. Was feeling better while taking it. Reports shortness of breath has improved but is most noticeable when she lies down, it is difficult to get comfortable but then she is able to sleep and does not experience PND. Chest pain only occurs occasionally when walking a long distance or with more activity than normal. Has less chest pain than in the past and fatigue has improved.  Walks 30 minutes daily, admits she is not walking at a very fast pace. She denies lower extremity edema, palpitations, diaphoresis, weakness, presyncope, syncope.  She was advised to start spironolactone 25 mg once daily.  She contacted our office 12/15/2022 to report that spironolactone and Lasix were stopped during an ED visit for orthostatic hypotension secondary to dehydration 12/10/22.   She called our office 03/31/2023 to report increased fatigue, shortness of breath, and chest pressure with activity. She was subsequently scheduled for office visit.   Today, she is here alone today. Reports she is feeling lightheaded and more short of breath with minimal activity. Feels this has worsened over the past 3 months. She walks about a quarter of a mile several days per week and has to stop to rest before completing the walk. Chest pressure is associated with shortness of breath and she additionally gets lightheaded. If she lies on her left side at night, she feels like she cannot breath and feels that her heart is racing. While she is talking,  she sometimes makes a gasping sound without her awareness, like her body needs more air. Prior to hospitalization 12/2022, she had been taking spironolactone for several weeks and did not note any improvement in symptoms.  This hospitalization was following a long bout of diarrhea after starting a Rx for chronic constipation. She no longer takes this medication.  Also has pelvic prolapse and overactive bladder. No  history of lung problems. Never smoked but has grown up around smokers and her husband smokes.  She denies edema, orthopnea, PND, palpitations, presyncope, syncope.  Weight has been stable.  Past Medical History:  Diagnosis Date   Acid reflux    ALLERGIC RHINITIS    Anxiety    CAD (coronary artery disease), native coronary artery    coronary CTA 11/2019 with a calcium score of 146 and mild nonobstructive plaque in the mid LAD.   Depression    Diverticulosis    Dyspnea    with exertion    Esophageal stricture    Hiatal hernia    with  Schatzki's Ring   History of kidney stones    Hyperlipidemia    Hypertension    IBS (irritable bowel syndrome)     Past Surgical History:  Procedure Laterality Date   ABDOMINAL HYSTERECTOMY     ANKLE FRACTURE SURGERY  2012   right   COLONOSCOPY  multiple   ESOPHAGOGASTRODUODENOSCOPY  09/14/05   multiple   KIDNEY STONE SURGERY     TONSILLECTOMY     TUBAL LIGATION     VESICOVAGINAL FISTULA CLOSURE W/ TAH     XI ROBOTIC ASSISTED HIATAL HERNIA REPAIR N/A 01/13/2021   Procedure: ROBOTIC ASSISTED NISSEN FUNDOPLICATION;  Surgeon: Quentin Ore, MD;  Location: [facility];  Service: General;  Laterality: N/A;    Current Medications: Current Meds  Medication Sig   aspirin EC 81 MG tablet Take 1 tablet (81 mg total) by mouth daily.   atorvastatin (LIPITOR) 80 MG tablet Take 80 mg by mouth daily.   cetirizine (ZYRTEC) 5 MG tablet Take 5 mg by mouth daily.   Cholecalciferol (VITAMIN D-3) 1000 units CAPS Take 1,000 Units by mouth daily.   CYMBALTA 60 MG capsule Take 120 mg by mouth at bedtime.   ezetimibe (ZETIA) 10 MG tablet Take 1 tablet (10 mg total) by mouth daily.   fluticasone (FLONASE) 50 MCG/ACT nasal spray Place 2 sprays into both nostrils daily as needed for allergies or rhinitis.    furosemide (LASIX) 20 MG tablet Take 1 tablet (20 mg total) by mouth daily.   lisinopril (ZESTRIL) 5 MG tablet Take 5 mg by mouth daily.   Magnesium 500 MG  TABS Take 500 mg by mouth daily.   metoprolol tartrate (LOPRESSOR) 50 MG tablet Take one (1) tablet ( 50 mg) by mouth 2 hours prior to CT scan.   Multiple Vitamin (MULTIVITAMIN) capsule Take 1 capsule by mouth every morning.    pantoprazole (PROTONIX) 40 MG tablet Take 40 mg by mouth 2 (two) times daily.   [DISCONTINUED] lisinopril (ZESTRIL) 10 MG tablet Take 10 mg by mouth daily.     Allergies:   Patient has no known allergies.   Social History   Socioeconomic History   Marital status: Married    Spouse name: Not on file   Number of children: 3   Years of education: Not on file   Highest education level: Not on file  Occupational History   Occupation: Customer Service @ [employer]    Employer: [employer]  Occupation: CUST Futures trader: [employer]  Tobacco Use   Smoking status: Never   Smokeless tobacco: Never  Vaping Use   Vaping Use: Never used  Substance and Sexual Activity   Alcohol use: No   Drug use: No   Sexual activity: Never  Other Topics Concern   Not on file  Social History Narrative   Not on file   Social Determinants of Health   Financial Resource Strain: Not on file  Food Insecurity: No Food Insecurity (12/11/2022)   Hunger Vital Sign    Worried About Running Out of Food in the Last Year: Never true    Ran Out of Food in the Last Year: Never true  Transportation Needs: No Transportation Needs (12/11/2022)   PRAPARE - Administrator, Civil Service (Medical): No    Lack of Transportation (Non-Medical): No  Physical Activity: Not on file  Stress: Not on file  Social Connections: Not on file     Family History: The patient's family history includes Allergies in her daughter, mother, and son; Diabetes in her paternal grandmother; Heart disease in her father and mother; Hyperlipidemia in her father and mother; Hypertension in her father and mother; Stroke in her father. There is no history of Colon cancer, Esophageal cancer, Rectal  cancer, or Stomach cancer.  ROS:   Please see the history of present illness.    +dyspnea on exertion with lightheadedness and chest pressure All other systems reviewed and are negative.  Labs/Other Studies Reviewed:    The following studies were reviewed today:  Echo 12/11/22 1. Left ventricular ejection fraction, by estimation, is 50 to 55%. The  left ventricle has low normal function. Left ventricular endocardial  border not optimally defined to evaluate regional wall motion. Left  ventricular diastolic parameters are  consistent with Grade I diastolic dysfunction (impaired relaxation).   2. Right ventricular systolic function was not well visualized. The right  ventricular size is normal. Tricuspid regurgitation signal is inadequate  for assessing PA pressure.   3. The mitral valve is degenerative. No evidence of mitral valve  regurgitation. No evidence of mitral stenosis.   4. The aortic valve is tricuspid. There is mild calcification of the  aortic valve. Aortic valve regurgitation is not visualized. No aortic  stenosis is present.   Comparison(s): No significant change from prior study. Prior images  reviewed side by side.   Echo 08/30/22 1. Left ventricular ejection fraction, by estimation, is 45 to 50%. The  left ventricle has mildly decreased function. The left ventricle  demonstrates global hypokinesis. There is mild left ventricular  hypertrophy. Left ventricular diastolic parameters  are consistent with Grade I diastolic dysfunction (impaired relaxation).  The average left ventricular global longitudinal strain is -16.4 %. The  global longitudinal strain is abnormal.   2. Right ventricular systolic function is normal. The right ventricular  size is normal. There is normal pulmonary artery systolic pressure. The  estimated right ventricular systolic pressure is 26.6 mmHg.   3. The mitral valve is normal in structure. No evidence of mitral valve  regurgitation. No  evidence of mitral stenosis.   4. The aortic valve is tricuspid. There is mild calcification of the  aortic valve. There is mild thickening of the aortic valve. Aortic valve  regurgitation is not visualized. Aortic valve sclerosis is present, with  no evidence of aortic valve stenosis.   5. The inferior vena cava is normal in size with greater than 50%  respiratory variability, suggesting right atrial pressure of 3 mmHg.    Lexiscan Myovview 09/16/20  <69mm ST depression at rest, mild worsening with stress Nuclear stress EF: 55%. The left ventricular ejection fraction is normal (55-65%). The study is normal. This is a low risk study.   Echo 09/04/20   1. Left ventricular ejection fraction, by estimation, is 60 to 65%. The  left ventricle has normal function. The left ventricle has no regional  wall motion abnormalities. There is mild concentric left ventricular  hypertrophy. Left ventricular diastolic  parameters are consistent with Grade I diastolic dysfunction (impaired  relaxation).   2. Right ventricular systolic function is normal. The right ventricular  size is normal. There is normal pulmonary artery systolic pressure.   3. The pericardial effusion is posterior to the left ventricle. There is  no evidence of cardiac tamponade.   4. The mitral valve is normal in structure. Trivial mitral valve  regurgitation.   5. The aortic valve is tricuspid. There is mild thickening of the aortic  valve. Aortic valve regurgitation is not visualized.   6. The inferior vena cava is normal in size with greater than 50%  respiratory variability, suggesting right atrial pressure of 3 mmHg.   Comparison(s): No significant change from prior study.   CCTA 11/07/19  1. Coronary calcium score of 146. This was 36 percentile for age and sex matched control.   2. 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 Right dominance.   3.  Mild non obstructive calcified plaque in the mid LAD   CAD-RADS 2. Mild non-obstructive CAD (25-49%). Consider non-atherosclerotic causes of chest pain. Consider preventive therapy and risk factor modification.    Recent Labs: 12/11/2022: Hemoglobin 12.2; Platelets 305 12/12/2022: BUN 12; Creatinine, Ser 0.71; Magnesium 2.0; Potassium 3.9; Sodium 138  Recent Lipid Panel    Component Value Date/Time   CHOL 125 11/10/2020 1117   TRIG 93 11/10/2020 1117   HDL 68 11/10/2020 1117   CHOLHDL 1.8 11/10/2020 1117   LDLCALC 40 11/10/2020 1117     Risk Assessment/Calculations:      Physical Exam:    VS:  BP (!) 138/98   Pulse 81   Ht 5\' 2"  (1.575 m)   Wt 155 lb 6.4 oz (70.5 kg)   SpO2 98% Comment: walked 96 dropped 91 ended at 98  BMI 28.42 kg/m     Wt Readings from Last 3 Encounters:  04/13/23 155 lb 6.4 oz (70.5 kg)  12/11/22 156 lb 15.5 oz (71.2 kg)  10/26/22 158 lb 3.2 oz (71.8 kg)     GEN:  Well nourished, well developed in no acute distress HEENT: Normal NECK: No JVD; No carotid bruits CARDIAC: RRR, no murmurs, rubs, gallops RESPIRATORY:  Clear to auscultation without rales, wheezing or rhonchi  ABDOMEN: Soft, non-tender, non-distended MUSCULOSKELETAL:  No edema; No deformity. 2+ pedal pulses, equal bilaterally SKIN: Warm and dry NEUROLOGIC:  Alert and oriented x 3 PSYCHIATRIC:  Normal affect   EKG:  EKG is ordered today.  EKG reveals normal sinus rhythm at 81 bpm, cannot rule out anterior infarct, nonspecific TWI, no acute change from previous tracing  Diagnoses:    1. DOE (dyspnea on exertion)   2. 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 Coronary artery disease involving native coronary artery of native heart with other form of angina pectoris (HCC)   4. Hyperlipidemia LDL  goal <70   5. Essential hypertension      Assessment and Plan:     DOE: Symptoms of shortness of  breath and DOE have worsened over the past 3 months.  Symptoms associated with chest pressure and lightheadedness as well. Unable to complete 1/4 mile walk without stopping to rest. Mildly reduced LVEF on echo 12/11/22. No evidence of significant volume overload.  No orthopnea, edema, or PND. Weight is stable. Will get CBC, TSH, CMET to ensure no abnormality that may be contributing to DOE/SOB. History of nonobstructive CAD on coronary CTA 11/2019. We will update coronary CTA to evaluate worsening ischemia. Consideration given to pulmonary testing, will await results of lung CT with coronary CTA that is ordered.  If no evidence of obstructive CAD, consider referral to pulmonology. Continue Lasix.   HFmrEF: Mildly reduced LVEF 50 to 55% on echo 12/11/2022, slight improvement from echo 08/30/22 (EF 45-50%), however declined from from 60-65% on echo 09/2020. Could not tolerate [place]. She is having worsening SOB, dyspnea as noted above. No edema, weight gain, orthopnea, or PND. Appears euvolemic on exam. Added spironolactone at last office visit however it was subsequently stopped during hospitalization for orthostatic hypotension.  BP is soft at times.  Will continue lisinopril and Lasix for now. Consider up titration of GDMT at next office visit.   Nonobstructive CAD: Coronary calcium score 146, 84th percentile for age/sex.  Mild nonobstructive calcified plaque in mid LAD (25 to 49%) on CCTA 11/2019. Low risk nuclear stress test 09/2020. She reports worsening shortness of breath and dyspnea on exertion. Has occasional chest tightness and lightheadedness that accompany DOE.  Walks frequently for exercise, has to stop to rest during 1/4 miles walk.  We will repeat coronary CTA to evaluate for worsening ischemia.  Will have her take metoprolol 50 mg 2 hours prior.  Hypertension: BP initially slightly elevated, improved upon my recheck.  She was previously placed on spironolactone in October 2023 which was stopped  during admission for orthostatic hypotension 12/2022.  BP is well-controlled today.  We will continue lisinopril 5 mg daily and Lasix 20 mg daily.   Hyperlipidemia LDL goal < 70: LDL 43 on 05/17/22. We will recheck today. Continue atorvastatin.      Disposition: Keep your June appointment with Dr. Mayford Knife  Medication Adjustments/Labs and Tests Ordered: Current medicines are reviewed at length with the patient today.  Concerns regarding medicines are outlined above.  Orders Placed This Encounter  Procedures   CT CORONARY MORPH W/CTA COR W/SCORE W/CA W/CM &/OR WO/CM   CBC   TSH   Lipid Profile   Comp Met (CMET)   EKG 12-Lead   Meds ordered this encounter  Medications   metoprolol tartrate (LOPRESSOR) 50 MG tablet    Sig: Take one (1) tablet ( 50 mg) by mouth 2 hours prior to CT scan.    Dispense:  1 tablet    Refill:  0    Patient Instructions  Medication Instructions:   Your physician recommends that you continue on your current medications as directed. Please refer to the Current Medication list given to you today.   *If you need a refill on your cardiac medications before your next appointment, please call your pharmacy*   Lab Work:  TODAY!!!! CBC/TSH/LIPID/CMET  If you have labs (blood work) drawn today and your tests are completely normal, you will receive your results only by: MyChart Message (if you have MyChart) OR A paper copy in the mail If you have any  lab test that is abnormal or we need to change your treatment, we will call you to review the results.   Testing/Procedures:    Your cardiac CT will be scheduled at one of the below locations:   [facility] [address] [place], [place] [address] [phone]   If scheduled at [facility], please arrive at the [facility] and Children's Entrance (Entrance C2) of [facility] 30 minutes prior to test start time. You can use the FREE valet parking offered at entrance C (encouraged  to control the heart rate for the test)  Proceed to the [facility] Radiology Department (first floor) to check-in and test prep.  All radiology patients and guests should use entrance C2 at [facility], accessed from [facility], even though the hospital's physical address listed is [address]    Please follow these instructions carefully (unless otherwise directed):  On the Night Before the Test: Be sure to Drink plenty of water. Do not consume any caffeinated/decaffeinated beverages or chocolate 12 hours prior to your test. Do not take any antihistamines 12 hours prior to your test.  On the Day of the Test: Drink plenty of water until 1 hour prior to the test. Do not eat any food 1 hour prior to test. You may take your regular medications prior to the test.  Take metoprolol (Lopressor)one tablet by mouth ( 50 mg)  two hours prior to test. If you take Furosemide please HOLD on the morning of the test. FEMALES- please wear underwire-free bra if available, avoid dresses & tight clothing    After the Test: Drink plenty of water. After receiving IV contrast, you may experience a mild flushed feeling. This is normal. On occasion, you may experience a mild rash up to 24 hours after the test. This is not dangerous. If this occurs, you can take Benadryl 25 mg and increase your fluid intake. If you experience trouble breathing, this can be serious. If it is severe call 911 IMMEDIATELY. If it is mild, please call our office. If you take any of these medications: Glipizide/Metformin, Avandament, Glucavance, please do not take 48 hours after completing test unless otherwise instructed.  We will call to schedule your test 2-4 weeks out understanding that some insurance companies will need an authorization prior to the service being performed.   For non-scheduling related questions, please contact the cardiac imaging nurse navigator should you have any  questions/concerns: Rockwell Alexandria, Cardiac Imaging Nurse Navigator Larey Brick, Cardiac Imaging Nurse Navigator [facility] Direct Office Dial: [phone]   For scheduling needs, including cancellations and rescheduling, please call [place], [phone].    Follow-Up: At [facility], you and your health needs are our priority.  As part of our continuing mission to provide you with exceptional heart care, we have created designated Provider Care Teams.  These Care Teams include your primary Cardiologist (physician) and Advanced Practice Providers (APPs -  Physician Assistants and Nurse Practitioners) who all work together to provide you with the care you need, when you need it.  We recommend signing up for the patient portal called "MyChart".  Sign up information is provided on this After Visit Summary.  MyChart is used to connect with patients for Virtual Visits (Telemedicine).  Patients are able to view lab/test results, encounter notes, upcoming appointments, etc.  Non-urgent messages can be sent to your provider as well.   To learn more about what you can  do with MyChart, go to [email or web address].    Your next appointment:   1 month(s)  Provider:   Armanda Magic, MD        Signed, Levi Aland, NP  04/13/2023 11:54 AM    [facility]

## 2023-04-13 ENCOUNTER — Encounter: Payer: Self-pay | Admitting: Nurse Practitioner

## 2023-04-13 ENCOUNTER — Ambulatory Visit: Payer: Medicare HMO | Attending: Nurse Practitioner | Admitting: Nurse Practitioner

## 2023-04-13 VITALS — BP 110/60 | HR 81 | Ht 62.0 in | Wt 155.4 lb

## 2023-04-13 DIAGNOSIS — I2583 Coronary atherosclerosis due to lipid rich plaque: Secondary | ICD-10-CM | POA: Diagnosis not present

## 2023-04-13 DIAGNOSIS — I5022 Chronic systolic (congestive) heart failure: Secondary | ICD-10-CM

## 2023-04-13 DIAGNOSIS — E785 Hyperlipidemia, unspecified: Secondary | ICD-10-CM | POA: Diagnosis not present

## 2023-04-13 DIAGNOSIS — R0609 Other forms of dyspnea: Secondary | ICD-10-CM

## 2023-04-13 DIAGNOSIS — I25118 Atherosclerotic heart disease of native coronary artery with other forms of angina pectoris: Secondary | ICD-10-CM | POA: Diagnosis not present

## 2023-04-13 DIAGNOSIS — I10 Essential (primary) hypertension: Secondary | ICD-10-CM

## 2023-04-13 DIAGNOSIS — I2510 Atherosclerotic heart disease of native coronary artery without angina pectoris: Secondary | ICD-10-CM | POA: Diagnosis not present

## 2023-04-13 LAB — CBC

## 2023-04-13 LAB — LIPID PANEL
LDL Chol Calc (NIH): 37 mg/dL (ref 0–99)
VLDL Cholesterol Cal: 16 mg/dL (ref 5–40)

## 2023-04-13 LAB — COMPREHENSIVE METABOLIC PANEL
Albumin: 4 g/dL (ref 3.9–4.9)
BUN: 10 mg/dL (ref 8–27)
Bilirubin Total: 0.4 mg/dL (ref 0.0–1.2)
Glucose: 82 mg/dL (ref 70–99)

## 2023-04-13 MED ORDER — METOPROLOL TARTRATE 50 MG PO TABS: ORAL_TABLET | ORAL | 0 refills | Status: DC

## 2023-04-13 NOTE — Patient Instructions (Signed)
Medication Instructions:   Your physician recommends that you continue on your current medications as directed. Please refer to the Current Medication list given to you today.   *If you need a refill on your cardiac medications before your next appointment, please call your pharmacy*   Lab Work:  TODAY!!!! CBC/TSH/LIPID/CMET  If you have labs (blood work) drawn today and your tests are completely normal, you will receive your results only by: MyChart Message (if you have MyChart) OR A paper copy in the mail If you have any lab test that is abnormal or we need to change your treatment, we will call you to review the results.   Testing/Procedures:    Your cardiac CT will be scheduled at one of the below locations:   [facility] [address] [place], [place] [address] [phone]   If scheduled at [facility], please arrive at the [facility] Entrance (Entrance C2) of [facility] 30 minutes prior to test start time. You can use the FREE valet parking offered at entrance C (encouraged to control the heart rate for the test)  Proceed to the [facility] Radiology Department (first floor) to check-in and test prep.  All radiology patients and guests should use entrance C2 at [facility], accessed from [facility], even though the hospital's physical address listed is [address]    Please follow these instructions carefully (unless otherwise directed):  On the Night Before the Test: Be sure to Drink plenty of water. Do not consume any caffeinated/decaffeinated beverages or chocolate 12 hours prior to your test. Do not take any antihistamines 12 hours prior to your test.  On the Day of the Test: Drink plenty of water until 1 hour prior to the test. Do not eat any food 1 hour prior to test. You may take your regular medications prior to the test.  Take metoprolol (Lopressor)one tablet by mouth ( 50 mg)   two hours prior to test. If you take Furosemide please HOLD on the morning of the test. FEMALES- please wear underwire-free bra if available, avoid dresses & tight clothing    After the Test: Drink plenty of water. After receiving IV contrast, you may experience a mild flushed feeling. This is normal. On occasion, you may experience a mild rash up to 24 hours after the test. This is not dangerous. If this occurs, you can take Benadryl 25 mg and increase your fluid intake. If you experience trouble breathing, this can be serious. If it is severe call 911 IMMEDIATELY. If it is mild, please call our office. If you take any of these medications: Glipizide/Metformin, Avandament, Glucavance, please do not take 48 hours after completing test unless otherwise instructed.  We will call to schedule your test 2-4 weeks out understanding that some insurance companies will need an authorization prior to the service being performed.   For non-scheduling related questions, please contact the cardiac imaging nurse navigator should you have any questions/concerns: Rockwell Alexandria, Cardiac Imaging Nurse Navigator Larey Brick, Cardiac Imaging Nurse Navigator [facility] Direct Office Dial: [phone]   For scheduling needs, including cancellations and rescheduling, please call [place], [phone].    Follow-Up: At [facility], you and your health needs are our priority.  As part of our continuing mission to provide you with exceptional heart care, we have created designated Provider Care Teams.  These Care Teams include your primary Cardiologist (physician) and Advanced Practice Providers (  APPs -  Physician Assistants and Nurse Practitioners) who all work together to provide you with the care you need, when you need it.  We recommend signing up for the patient portal called "MyChart".  Sign up information is provided on this After Visit Summary.  MyChart is used to  connect with patients for Virtual Visits (Telemedicine).  Patients are able to view lab/test results, encounter notes, upcoming appointments, etc.  Non-urgent messages can be sent to your provider as well.   To learn more about what you can do with MyChart, go to [email or web address].    Your next appointment:   1 month(s)  Provider:   Armanda Magic, MD

## 2023-04-14 LAB — COMPREHENSIVE METABOLIC PANEL
ALT: 25 IU/L (ref 0–32)
AST: 30 IU/L (ref 0–40)
Albumin/Globulin Ratio: 1.8 (ref 1.2–2.2)
Alkaline Phosphatase: 73 IU/L (ref 44–121)
BUN/Creatinine Ratio: 14 (ref 12–28)
CO2: 29 mmol/L (ref 20–29)
Calcium: 8.9 mg/dL (ref 8.7–10.3)
Chloride: 101 mmol/L (ref 96–106)
Creatinine, Ser: 0.73 mg/dL (ref 0.57–1.00)
Globulin, Total: 2.2 g/dL (ref 1.5–4.5)
Potassium: 3.7 mmol/L (ref 3.5–5.2)
Sodium: 143 mmol/L (ref 134–144)
Total Protein: 6.2 g/dL (ref 6.0–8.5)
eGFR: 89 mL/min/{1.73_m2} (ref >59)

## 2023-04-14 LAB — CBC
Hemoglobin: 13.5 g/dL (ref 11.1–15.9)
MCH: 27.7 pg (ref 26.6–33.0)
MCV: 86 fL (ref 79–97)
Platelets: 300 10*3/uL (ref 150–450)
RBC: 4.87 x10E6/uL (ref 3.77–5.28)
WBC: 6 10*3/uL (ref 3.4–10.8)

## 2023-04-14 LAB — LIPID PANEL
Chol/HDL Ratio: 1.8 ratio (ref 0.0–4.4)
Cholesterol, Total: 122 mg/dL (ref 100–199)
HDL: 69 mg/dL (ref >39)
Triglycerides: 83 mg/dL (ref 0–149)

## 2023-04-14 LAB — TSH: TSH: 2.6 u[IU]/mL (ref 0.450–4.500)

## 2023-04-24 DIAGNOSIS — D414 Neoplasm of uncertain behavior of bladder: Secondary | ICD-10-CM | POA: Diagnosis not present

## 2023-04-24 DIAGNOSIS — R350 Frequency of micturition: Secondary | ICD-10-CM | POA: Diagnosis not present

## 2023-04-24 DIAGNOSIS — N8111 Cystocele, midline: Secondary | ICD-10-CM | POA: Diagnosis not present

## 2023-04-24 DIAGNOSIS — R3915 Urgency of urination: Secondary | ICD-10-CM | POA: Diagnosis not present

## 2023-04-27 DIAGNOSIS — H524 Presbyopia: Secondary | ICD-10-CM | POA: Diagnosis not present

## 2023-05-02 ENCOUNTER — Telehealth (HOSPITAL_COMMUNITY): Payer: Self-pay | Admitting: Emergency Medicine

## 2023-05-02 NOTE — Telephone Encounter (Signed)
Attempted to call patient regarding upcoming cardiac CT appointment. °Left message on voicemail with name and callback number °Eulamae Greenstein RN Navigator Cardiac Imaging °[facility] °[phone] Office °[phone] Cell ° °

## 2023-05-03 ENCOUNTER — Ambulatory Visit (HOSPITAL_COMMUNITY)
Admission: RE | Admit: 2023-05-03 | Discharge: 2023-05-03 | Disposition: A | Discharge status: Home / Self Care | Payer: Medicare HMO | Source: Ambulatory Visit | Attending: Nurse Practitioner | Admitting: Nurse Practitioner

## 2023-05-03 ENCOUNTER — Ambulatory Visit (HOSPITAL_BASED_OUTPATIENT_CLINIC_OR_DEPARTMENT_OTHER)
Admission: RE | Admit: 2023-05-03 | Discharge: 2023-05-03 | Disposition: A | Discharge status: Home / Self Care | Payer: Medicare HMO | Source: Ambulatory Visit | Attending: Cardiology | Admitting: Cardiology

## 2023-05-03 ENCOUNTER — Other Ambulatory Visit: Payer: Self-pay | Admitting: Physician Assistant

## 2023-05-03 ENCOUNTER — Other Ambulatory Visit: Payer: Self-pay | Admitting: Cardiology

## 2023-05-03 DIAGNOSIS — I5022 Chronic systolic (congestive) heart failure: Secondary | ICD-10-CM | POA: Insufficient documentation

## 2023-05-03 DIAGNOSIS — R931 Abnormal findings on diagnostic imaging of heart and coronary circulation: Secondary | ICD-10-CM | POA: Insufficient documentation

## 2023-05-03 DIAGNOSIS — E785 Hyperlipidemia, unspecified: Secondary | ICD-10-CM | POA: Diagnosis not present

## 2023-05-03 DIAGNOSIS — I25118 Atherosclerotic heart disease of native coronary artery with other forms of angina pectoris: Secondary | ICD-10-CM | POA: Insufficient documentation

## 2023-05-03 DIAGNOSIS — R0609 Other forms of dyspnea: Secondary | ICD-10-CM | POA: Diagnosis not present

## 2023-05-03 MED ORDER — NITROGLYCERIN 0.4 MG SL SUBL
SUBLINGUAL_TABLET | SUBLINGUAL | Status: AC
  Filled 2023-05-03: qty 2

## 2023-05-03 MED ORDER — METOPROLOL TARTRATE 5 MG/5ML IV SOLN
10.0000 mg | Freq: Once | INTRAVENOUS | Status: AC
  Administered 2023-05-03: 10 mg via INTRAVENOUS

## 2023-05-03 MED ORDER — NITROGLYCERIN 0.4 MG SL SUBL
0.8000 mg | SUBLINGUAL_TABLET | Freq: Once | SUBLINGUAL | Status: AC
  Administered 2023-05-03: 0.8 mg via SUBLINGUAL

## 2023-05-03 MED ORDER — METOPROLOL TARTRATE 5 MG/5ML IV SOLN
INTRAVENOUS | Status: AC
  Filled 2023-05-03: qty 10

## 2023-05-03 MED ORDER — IOHEXOL 350 MG/ML SOLN
95.0000 mL | Freq: Once | INTRAVENOUS | Status: AC | PRN
  Administered 2023-05-03: 95 mL via INTRAVENOUS

## 2023-05-05 ENCOUNTER — Other Ambulatory Visit: Payer: Self-pay | Admitting: *Deleted

## 2023-05-05 MED ORDER — ISOSORBIDE MONONITRATE ER 30 MG PO TB24: 30.0000 mg | ORAL_TABLET | Freq: Every day | ORAL | 11 refills | Status: DC

## 2023-05-05 MED ORDER — FUROSEMIDE 20 MG PO TABS: 20.0000 mg | ORAL_TABLET | ORAL | 3 refills | Status: DC

## 2023-05-25 ENCOUNTER — Ambulatory Visit (INDEPENDENT_AMBULATORY_CARE_PROVIDER_SITE_OTHER): Payer: Medicare HMO

## 2023-05-25 ENCOUNTER — Encounter: Payer: Self-pay | Admitting: Cardiology

## 2023-05-25 ENCOUNTER — Ambulatory Visit: Payer: Medicare HMO | Attending: Cardiology | Admitting: Cardiology

## 2023-05-25 VITALS — BP 130/76 | HR 86 | Ht 63.0 in | Wt 158.4 lb

## 2023-05-25 DIAGNOSIS — E785 Hyperlipidemia, unspecified: Secondary | ICD-10-CM | POA: Diagnosis not present

## 2023-05-25 DIAGNOSIS — R002 Palpitations: Secondary | ICD-10-CM

## 2023-05-25 DIAGNOSIS — I25118 Atherosclerotic heart disease of native coronary artery with other forms of angina pectoris: Secondary | ICD-10-CM | POA: Diagnosis not present

## 2023-05-25 DIAGNOSIS — I10 Essential (primary) hypertension: Secondary | ICD-10-CM | POA: Diagnosis not present

## 2023-05-25 MED ORDER — ISOSORBIDE MONONITRATE ER 60 MG PO TB24: 60.0000 mg | ORAL_TABLET | Freq: Every day | ORAL | 3 refills | Status: DC

## 2023-05-25 NOTE — Progress Notes (Unsigned)
Enrolled for [employer] to mail a ZIO XT long term holter monitor to the patients address on file.  

## 2023-05-25 NOTE — Progress Notes (Signed)
Date:  05/25/2023   ID:  Debbie [name], DOB February 10, 1951, MRN [id number]  Patient Location:  Home  Provider location:   [place]  PCP:  Laurann Montana, MD  Cardiologist:  Armanda Magic, MD  Electrophysiologist:  None   Chief Complaint:  HTN, HLD, CAD  History of Present Illness:    Debbie [name] is a 72 y.o. female with a hx of HTN, HLD and ASCAD with coronary CTA 11/2019 with a calcium score of 146 and mild nonobstructive plaque in the mid LAD.  She has been on ASA and statin.    She is here today for followup and is doing well.  She tells me that occasionally she will have pressure on her chest at night.  She will get up to go to the bathroom and then when she comes back to bed her chest feels heavy and she feels SOB for a few seconds and then it resolves.  She says that during the day she may also feel the chest pressure if she has been doing housework for a while.  She will also get DOE when cleaning up her house.  She also has been noticing her heart racing if she is cleaning her house. She denies any PND, orthopnea, dizziness -or syncope. She has chronic LE edema controlled on diuretics.  She is compliant with her meds and is tolerating meds with no SE.    Prior CV studies:   The following studies were reviewed today:  Coronary CTA, 2D echo  Past Medical History:  Diagnosis Date   Acid reflux    ALLERGIC RHINITIS    Anxiety    CAD (coronary artery disease), native coronary artery    coronary CTA 11/2019 with a calcium score of 146 and mild nonobstructive plaque in the mid LAD.   Depression    Diverticulosis    Dyspnea    with exertion    Esophageal stricture    Hiatal hernia    with  Schatzki's Ring   History of kidney stones    Hyperlipidemia    Hypertension    IBS (irritable bowel syndrome)    Past Surgical History:  Procedure Laterality Date   ABDOMINAL HYSTERECTOMY     ANKLE FRACTURE SURGERY  2012   right   COLONOSCOPY  multiple    ESOPHAGOGASTRODUODENOSCOPY  09/14/05   multiple   KIDNEY STONE SURGERY     TONSILLECTOMY     TUBAL LIGATION     VESICOVAGINAL FISTULA CLOSURE W/ TAH     XI ROBOTIC ASSISTED HIATAL HERNIA REPAIR N/A 01/13/2021   Procedure: ROBOTIC ASSISTED NISSEN FUNDOPLICATION;  Surgeon: Quentin Ore, MD;  Location: [facility];  Service: General;  Laterality: N/A;     Current Meds  Medication Sig   aspirin EC 81 MG tablet Take 1 tablet (81 mg total) by mouth daily.   atorvastatin (LIPITOR) 80 MG tablet Take 80 mg by mouth daily.   cetirizine (ZYRTEC) 5 MG tablet Take 5 mg by mouth daily.   Cholecalciferol (VITAMIN D-3) 1000 units CAPS Take 1,000 Units by mouth daily.   CYMBALTA 60 MG capsule Take 120 mg by mouth at bedtime.   ezetimibe (ZETIA) 10 MG tablet Take 1 tablet (10 mg total) by mouth daily.   famotidine (PEPCID) 20 MG tablet TAKE 1 TABLET TWICE DAILY   fluticasone (FLONASE) 50 MCG/ACT nasal spray Place 2 sprays into both nostrils daily as needed for allergies or rhinitis.    furosemide (LASIX) 20 MG tablet Take  1 tablet (20 mg total) by mouth as directed. If you gain 2 lbs in 24 hours or 5 lbs in one week take one (1) tablet. If you have leg swelling or continued shortness of breath take one tablet by mouth ( 20 mg) daily.   isosorbide mononitrate (IMDUR) 30 MG 24 hr tablet Take 1 tablet (30 mg total) by mouth daily.   lisinopril (ZESTRIL) 5 MG tablet Take 5 mg by mouth daily.   Magnesium 500 MG TABS Take 500 mg by mouth daily.   Multiple Vitamin (MULTIVITAMIN) capsule Take 1 capsule by mouth every morning.    pantoprazole (PROTONIX) 40 MG tablet Take 40 mg by mouth 2 (two) times daily.   [DISCONTINUED] metoprolol tartrate (LOPRESSOR) 50 MG tablet Take one (1) tablet ( 50 mg) by mouth 2 hours prior to CT scan.   [DISCONTINUED] Vibegron (GEMTESA) 75 MG TABS Take 75 mg by mouth daily.     Allergies:   Patient has no known allergies.   Social History   Tobacco Use   Smoking status: Never    Smokeless tobacco: Never  Vaping Use   Vaping Use: Never used  Substance Use Topics   Alcohol use: No   Drug use: No     Family Hx: The patient's family history includes Allergies in her daughter, mother, and son; Diabetes in her paternal grandmother; Heart disease in her father and mother; Hyperlipidemia in her father and mother; Hypertension in her father and mother; Stroke in her father. There is no history of Colon cancer, Esophageal cancer, Rectal cancer, or Stomach cancer.  ROS:   Please see the history of present illness.     All other systems reviewed and are negative.   Labs/Other Tests and Data Reviewed:    Recent Labs: 12/12/2022: Magnesium 2.0 04/13/2023: ALT 25; BUN 10; Creatinine, Ser 0.73; Hemoglobin 13.5; Platelets 300; Potassium 3.7; Sodium 143; TSH 2.600   Recent Lipid Panel Lab Results  Component Value Date/Time   CHOL 122 04/13/2023 09:26 AM   TRIG 83 04/13/2023 09:26 AM   HDL 69 04/13/2023 09:26 AM   CHOLHDL 1.8 04/13/2023 09:26 AM   LDLCALC 37 04/13/2023 09:26 AM    Wt Readings from Last 3 Encounters:  05/25/23 158 lb 6.4 oz (71.8 kg)  04/13/23 155 lb 6.4 oz (70.5 kg)  12/11/22 156 lb 15.5 oz (71.2 kg)     Objective:    Vital Signs:  BP 130/76   Pulse 86   Ht 5\' 3"  (1.6 m)   Wt 158 lb 6.4 oz (71.8 kg)   SpO2 96%   BMI 28.06 kg/m   GEN: Well nourished, well developed in no acute distress HEENT: Normal NECK: No JVD; No carotid bruits LYMPHATICS: No lymphadenopathy CARDIAC:RRR, no murmurs, rubs, gallops RESPIRATORY:  Clear to auscultation without rales, wheezing or rhonchi  ABDOMEN: Soft, non-tender, non-distended MUSCULOSKELETAL:  No edema; No deformity  SKIN: Warm and dry NEUROLOGIC:  Alert and oriented x 3 PSYCHIATRIC:  Normal affect   ASSESSMENT & PLAN:    1.  ASCAD -coronary CTA 11/2019 with a calcium score of 146 and mild nonobstructive plaque in the mid LAD. 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  She is also having some chest discomfort during the day with SOB but FFR on recent Coronary CTA was normal -increase Imdur to 60mg  daily which will also help with any esophageal spasm -Continue prescription drug management with aspirin 81 mg daily, atorvastatin 80 mg daily with as needed refills  2.  HTN -BP elevated on exam today with BP discrepancy in the arms -Continue prescription drug management with Imdur 30 mg daily, Zestril 5 mg daily with as needed refills -I have personally reviewed and interpreted outside labs performed by patient's PCP which showed serum creatinine 0.73 potassium 3.7 on 04/13/2023  3.  HLD -LDL goal < 70 -I have personally reviewed and interpreted outside labs performed by patient's PCP which showed LDL 37 and HDL 69 and ALT 25 on 04/13/2023 -Continue prescription drug management with atorvastatin 80 mg daily, Zetia 10 mg daily with as needed refills  4.  Palpitations -this seems to occur when she is doing housework and may be related to deconditioing -I will get a 2 week ziopatch to assess for arrhythmias  Medication Adjustments/Labs and Tests Ordered: Current medicines are reviewed at length with the patient today.  Concerns regarding medicines are outlined above.  Tests Ordered: No orders of the defined types were placed in this encounter.  Medication Changes: No orders of the defined types were placed in this encounter.   Disposition:  Follow up6 months  Signed, Armanda Magic, MD  05/25/2023 10:50 AM    [facility]

## 2023-05-25 NOTE — Addendum Note (Signed)
Addended by: Luellen Pucker on: 05/25/2023 11:15 AM   Modules accepted: Orders

## 2023-05-25 NOTE — Patient Instructions (Signed)
Medication Instructions:  Please increase your dose of Imdur from 30 mg to 60 mg daily.   *If you need a refill on your cardiac medications before your next appointment, please call your pharmacy*   Lab Work: None.  If you have labs (blood work) drawn today and your tests are completely normal, you will receive your results only by: MyChart Message (if you have MyChart) OR A paper copy in the mail If you have any lab test that is abnormal or we need to change your treatment, we will call you to review the results.   Testing/Procedures: Christena Deem- Long Term Monitor Instructions  Your physician has requested you wear a ZIO patch monitor for 14 days.  This is a single patch monitor. [employer] supplies one patch monitor per enrollment. Additional stickers are not available. Please do not apply patch if you will be having a Nuclear Stress Test,  Echocardiogram, Cardiac CT, MRI, or Chest Xray during the period you would be wearing the  monitor. The patch cannot be worn during these tests. You cannot remove and re-apply the  ZIO XT patch monitor.  Your ZIO patch monitor will be mailed 3 day USPS to your address on file. It may take 3-5 days  to receive your monitor after you have been enrolled.  Once you have received your monitor, please review the enclosed instructions. Your monitor  has already been registered assigning a specific monitor serial # to you.  Billing and Patient Assistance Program Information  We have supplied [employer] with any of your insurance information on file for billing purposes. [employer] offers a sliding scale Patient Assistance Program for patients that do not have  insurance, or whose insurance does not completely cover the cost of the ZIO monitor.  You must apply for the Patient Assistance Program to qualify for this discounted rate.  To apply, please call [employer] at [phone], select option 4, select option 2, ask to apply for  Patient Assistance Program. Meredeth Ide  will ask your household income, and how many people  are in your household. They will quote your out-of-pocket cost based on that information.  [employer] will also be able to set up a 38-month, interest-free payment plan if needed.  Applying the monitor   Shave hair from upper left chest.  Hold abrader disc by orange tab. Rub abrader in 40 strokes over the upper left chest as  indicated in your monitor instructions.  Clean area with 4 enclosed alcohol pads. Let dry.  Apply patch as indicated in monitor instructions. Patch will be placed under collarbone on left  side of chest with arrow pointing upward.  Rub patch adhesive wings for 2 minutes. Remove white label marked "1". Remove the white  label marked "2". Rub patch adhesive wings for 2 additional minutes.  While looking in a mirror, press and release button in center of patch. A small green light will  flash 3-4 times. This will be your only indicator that the monitor has been turned on.  Do not shower for the first 24 hours. You may shower after the first 24 hours.  Press the button if you feel a symptom. You will hear a small click. Record Date, Time and  Symptom in the Patient Logbook.  When you are ready to remove the patch, follow instructions on the last 2 pages of Patient  Logbook. Stick patch monitor onto the last page of Patient Logbook.  Place Patient Logbook in the blue and white box. Use locking tab  on box and tape box closed  securely. The blue and white box has prepaid postage on it. Please place it in the mailbox as  soon as possible. Your physician should have your test results approximately 7 days after the  monitor has been mailed back to [facility].  Call [facility] Customer Care at [phone] if you have questions regarding  your ZIO XT patch monitor. Call them immediately if you see an orange light blinking on your  monitor.  If your monitor falls off in less than 4 days, contact our Monitor department at  [phone].  If your monitor becomes loose or falls off after 4 days call [employer] at [phone] for  suggestions on securing your monitor    Follow-Up: At [facility], you and your health needs are our priority.  As part of our continuing mission to provide you with exceptional heart care, we have created designated Provider Care Teams.  These Care Teams include your primary Cardiologist (physician) and Advanced Practice Providers (APPs -  Physician Assistants and Nurse Practitioners) who all work together to provide you with the care you need, when you need it.  We recommend signing up for the patient portal called "MyChart".  Sign up information is provided on this After Visit Summary.  MyChart is used to connect with patients for Virtual Visits (Telemedicine).  Patients are able to view lab/test results, encounter notes, upcoming appointments, etc.  Non-urgent messages can be sent to your provider as well.   To learn more about what you can do with MyChart, go to [email or web address].    Your next appointment:   6 month(s)  Provider:   Armanda Magic, MD

## 2023-05-30 DIAGNOSIS — R002 Palpitations: Secondary | ICD-10-CM

## 2023-06-17 ENCOUNTER — Other Ambulatory Visit: Payer: Self-pay | Admitting: Nurse Practitioner

## 2023-06-20 DIAGNOSIS — R002 Palpitations: Secondary | ICD-10-CM | POA: Diagnosis not present

## 2023-06-23 ENCOUNTER — Telehealth: Payer: Self-pay

## 2023-06-23 MED ORDER — METOPROLOL SUCCINATE ER 25 MG PO TB24: 25.0000 mg | ORAL_TABLET | Freq: Every day | ORAL | 3 refills | Status: DC

## 2023-06-23 NOTE — Telephone Encounter (Signed)
-----   Message from Armanda Magic sent at 06/22/2023 10:46 AM EDT ----- Heart monitor showed normal heart rhythm with several episodes of a fast heartbeat from the top of the heart called atrial tachycardia.  She also had rare at episodes of extra heartbeats from the bottom of the heart called PVCs.  Start Toprol-XL 25 mg daily and have her follow-up with extender in 4 weeks preferably Michelle'Swinyer, NP who she is seen before

## 2023-06-23 NOTE — Telephone Encounter (Signed)
Called patient to discuss that heart monitor showed normal heart rhythm with several episodes of a fast heartbeat from the top of the heart called atrial tachycardia.  Patient verbalizes understanding that she also had rare at episodes of extra heartbeats from the bottom of the heart called PVCs.  Patient agrees to start Toprol-XL 25 mg daily, order placed. F/U scheduled with  Eligha Bridegroom, NP 07/25/23.

## 2023-07-03 ENCOUNTER — Other Ambulatory Visit: Payer: Self-pay | Admitting: Physician Assistant

## 2023-07-03 DIAGNOSIS — R42 Dizziness and giddiness: Secondary | ICD-10-CM | POA: Diagnosis not present

## 2023-07-03 DIAGNOSIS — R399 Unspecified symptoms and signs involving the genitourinary system: Secondary | ICD-10-CM | POA: Diagnosis not present

## 2023-07-03 DIAGNOSIS — N63 Unspecified lump in unspecified breast: Secondary | ICD-10-CM

## 2023-07-03 DIAGNOSIS — N6310 Unspecified lump in the right breast, unspecified quadrant: Secondary | ICD-10-CM | POA: Diagnosis not present

## 2023-07-24 NOTE — Progress Notes (Deleted)
Cardiology Office Note:    Date:  07/24/2023   ID:  Debbie [name], DOB [birth year], MRN [id number]  PCP:  Laurann Montana, MD   [facility] Providers Cardiologist:  Armanda Magic, MD     Referring MD: Laurann Montana, MD   Chief Complaint: shortness of breath  History of Present Illness:    Debbie [name] is a very pleasant 72 y.o. female with a hx of HTN, HLD, family history of heart disease, abnormal EKG in 2019 showing normal sinus rhythm with diffuse T wave abnormality in anterior precordial leads and lateral leads.  Nuclear stress test in 2021 showed mild ST depression less than 1 mm at rest, low risk study.   Seen by Dr. Mayford Knife 09/2019 with dizziness and presyncope after standing up.  Blood pressure was low and lisinopril/HCTZ was cut back to 1 tablet daily.  She continued to have shortness of breath likely 2/2 deconditioning but due to her risk factors coronary CTA was performed.  CT 11/07/2019 revealed calcium score 146 (84th percentile for age/sex).  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  Aspirin was added.  Telemedicine visit on 12/18/2019 with Jacolyn Reedy, PA at which time she reported no further dizziness.  BP was normal. One year follow-up was recommended.   Seen by me on 08/18/22 and reported increased DOE over the previous 6 months. Able to complete grocery shopping without stopping but unable to unload all groceries up and down 7 stairs without resting. Difficulty getting her breath when going to urinate during the night  No increase in # of pillows needed and no PND or edema. No presyncope, syncope. Described chest pressure on occasion. No palpitations. 2D echo 08/30/22 revealed mildly reduced LV function 45 to 50% which was a decline from normal function on previous echo 09/2020. Started Farxiga 10 mg daily with stable follow-up bmet on 09/28/22.   Seen by me on  10/26/22 for follow-up. Unfortunately she had to stop [place] because of yeast infection. Was feeling better while taking it. Reports shortness of breath has improved but is most noticeable when she lies down, it is difficult to get comfortable but then she is able to sleep and does not experience PND. Chest pain only occurs occasionally when walking a long distance or with more activity than normal. Has less chest pain than in the past and fatigue has improved.  Walks 30 minutes daily, admits she is not walking at a very fast pace. She denies lower extremity edema, palpitations, diaphoresis, weakness, presyncope, syncope.  She was advised to start spironolactone 25 mg once daily.  She contacted our office 12/15/2022 to report that spironolactone and Lasix were stopped during an ED visit for orthostatic hypotension secondary to dehydration 12/10/22.   She called our office 03/31/2023 to report increased fatigue, shortness of breath, and chest pressure with activity. She was subsequently scheduled for office visit.   Seen by me on 04/13/23, feeling lightheaded and more short of breath with minimal activity. Feels this has worsened over the past 3 months. She walks about a quarter of a mile several days per week and has to stop to rest before completing the walk. Chest pressure is associated with shortness of breath and she additionally gets lightheaded. If she lies on her left side at night, she feels like she cannot breath and feels that her heart is racing. While she is talking, she sometimes makes a  gasping sound without her awareness, like her body needs more air. Prior to hospitalization 12/2022, she had been taking spironolactone for several weeks and did not note any improvement in symptoms.  This hospitalization was following a long bout of diarrhea after starting a Rx for chronic constipation. She no longer takes this medication. Also has pelvic prolapse and overactive bladder. No history of lung problems.  Never smoked but has grown up around smokers and her husband smokes.  She denies edema, orthopnea, PND, palpitations, presyncope, syncope.  Weight has been stable.  Seen in clinic by Dr. Mayford Knife on 05/25/23 and reported palpitations. Imdur was increased to 60 mg daily for chest discomfort. 2 week Zio patch monitor was ordered and revealed predominant rhythm sinus rhythm with average HR 79 bpm.  Several episodes of SVT with longest lasting 9 beats at maximal HR 154 bpm, rare PACs, rare PVCs. She was advised to start Toprol XL 25 mg daily.   Today, she is here for follow-up of palpitations.    Past Medical History:  Diagnosis Date   Acid reflux    ALLERGIC RHINITIS    Anxiety    CAD (coronary artery disease), native coronary artery    coronary CTA 11/2019 with a calcium score of 146 and mild nonobstructive plaque in the mid LAD.   Depression    Diverticulosis    Dyspnea    with exertion    Esophageal stricture    Hiatal hernia    with  Schatzki's Ring   History of kidney stones    Hyperlipidemia    Hypertension    IBS (irritable bowel syndrome)     Past Surgical History:  Procedure Laterality Date   ABDOMINAL HYSTERECTOMY     ANKLE FRACTURE SURGERY  2012   right   COLONOSCOPY  multiple   ESOPHAGOGASTRODUODENOSCOPY  09/14/05   multiple   KIDNEY STONE SURGERY     TONSILLECTOMY     TUBAL LIGATION     VESICOVAGINAL FISTULA CLOSURE W/ TAH     XI ROBOTIC ASSISTED HIATAL HERNIA REPAIR N/A 01/13/2021   Procedure: ROBOTIC ASSISTED NISSEN FUNDOPLICATION;  Surgeon: Quentin Ore, MD;  Location: [facility];  Service: General;  Laterality: N/A;    Current Medications: No outpatient medications have been marked as taking for the 07/25/23 encounter (Appointment) with Levi Aland, NP.     Allergies:   Patient has no known allergies.   Social History   Socioeconomic History   Marital status: Married    Spouse name: Not on file   Number of children: 3   Years of education:  Not on file   Highest education level: Not on file  Occupational History   Occupation: Clinical biochemist @ [employer]    Employer: [employer]   Occupation: Teacher, music: [employer]  Tobacco Use   Smoking status: Never   Smokeless tobacco: Never  Vaping Use   Vaping status: Never Used  Substance and Sexual Activity   Alcohol use: No   Drug use: No   Sexual activity: Never  Other Topics Concern   Not on file  Social History Narrative   Not on file   Social Determinants of Health   Financial Resource Strain: Not on file  Food Insecurity: No Food Insecurity (12/11/2022)   Hunger Vital Sign    Worried About Running Out of Food in the Last Year: Never true    Ran Out of Food in the Last Year: Never true  Transportation Needs: No Transportation Needs (12/11/2022)   PRAPARE - Administrator, Civil Service (Medical): No    Lack of Transportation (Non-Medical): No  Physical Activity: Not on file  Stress: Not on file  Social Connections: Unknown (10/12/2022)   Received from [facility]   Social Network    Social Network: Not on file     Family History: The patient's family history includes Allergies in her daughter, mother, and son; Diabetes in her paternal grandmother; Heart disease in her father and mother; Hyperlipidemia in her father and mother; Hypertension in her father and mother; Stroke in her father. There is no history of Colon cancer, Esophageal cancer, Rectal cancer, or Stomach cancer.  ROS:   Please see the history of present illness.    +dyspnea on exertion with lightheadedness and chest pressure All other systems reviewed and are negative.  Labs/Other Studies Reviewed:    The following studies were reviewed today:  Echo 12/11/22 1. Left ventricular ejection fraction, by estimation, is 50 to 55%. The  left ventricle has low normal function. Left ventricular endocardial  border not optimally defined to evaluate regional wall motion.  Left  ventricular diastolic parameters are  consistent with Grade I diastolic dysfunction (impaired relaxation).   2. Right ventricular systolic function was not well visualized. The right  ventricular size is normal. Tricuspid regurgitation signal is inadequate  for assessing PA pressure.   3. The mitral valve is degenerative. No evidence of mitral valve  regurgitation. No evidence of mitral stenosis.   4. The aortic valve is tricuspid. There is mild calcification of the  aortic valve. Aortic valve regurgitation is not visualized. No aortic  stenosis is present.   Comparison(s): No significant change from prior study. Prior images  reviewed side by side.   Echo 08/30/22 1. Left ventricular ejection fraction, by estimation, is 45 to 50%. The  left ventricle has mildly decreased function. The left ventricle  demonstrates global hypokinesis. There is mild left ventricular  hypertrophy. Left ventricular diastolic parameters  are consistent with Grade I diastolic dysfunction (impaired relaxation).  The average left ventricular global longitudinal strain is -16.4 %. The  global longitudinal strain is abnormal.   2. Right ventricular systolic function is normal. The right ventricular  size is normal. There is normal pulmonary artery systolic pressure. The  estimated right ventricular systolic pressure is 26.6 mmHg.   3. The mitral valve is normal in structure. No evidence of mitral valve  regurgitation. No evidence of mitral stenosis.   4. The aortic valve is tricuspid. There is mild calcification of the  aortic valve. There is mild thickening of the aortic valve. Aortic valve  regurgitation is not visualized. Aortic valve sclerosis is present, with  no evidence of aortic valve stenosis.   5. The inferior vena cava is normal in size with greater than 50%  respiratory variability, suggesting right atrial pressure of 3 mmHg.    Lexiscan Myovview 09/16/20  <50mm ST depression at rest, mild  worsening with stress Nuclear stress EF: 55%. The left ventricular ejection fraction is normal (55-65%). The study is normal. This is a low risk study.   Echo 09/04/20   1. Left ventricular ejection fraction, by estimation, is 60 to 65%. The  left ventricle has normal function. The left ventricle has no regional  wall motion abnormalities. There is mild concentric left ventricular  hypertrophy. Left ventricular diastolic  parameters are consistent with Grade I diastolic dysfunction (impaired  relaxation).   2.  Right ventricular systolic function is normal. The right ventricular  size is normal. There is normal pulmonary artery systolic pressure.   3. The pericardial effusion is posterior to the left ventricle. There is  no evidence of cardiac tamponade.   4. The mitral valve is normal in structure. Trivial mitral valve  regurgitation.   5. The aortic valve is tricuspid. There is mild thickening of the aortic  valve. Aortic valve regurgitation is not visualized.   6. The inferior vena cava is normal in size with greater than 50%  respiratory variability, suggesting right atrial pressure of 3 mmHg.   Comparison(s): No significant change from prior study.   CCTA 11/07/19  1. Coronary calcium score of 146. This was 31 percentile for age and sex matched control.   2. 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 Right dominance.   3.  Mild non obstructive calcified plaque in the mid LAD   CAD-RADS 2. Mild non-obstructive CAD (25-49%). Consider non-atherosclerotic causes of chest pain. Consider preventive therapy and risk factor modification.    Recent Labs: 12/12/2022: Magnesium 2.0 04/13/2023: ALT 25; BUN 10; Creatinine, Ser 0.73; Hemoglobin 13.5; Platelets 300; Potassium 3.7; Sodium 143; TSH 2.600  Recent Lipid Panel    Component Value Date/Time   CHOL 122 04/13/2023  0926   TRIG 83 04/13/2023 0926   HDL 69 04/13/2023 0926   CHOLHDL 1.8 04/13/2023 0926   LDLCALC 37 04/13/2023 0926     Risk Assessment/Calculations:      Physical Exam:    VS:  There were no vitals taken for this visit.    Wt Readings from Last 3 Encounters:  05/25/23 158 lb 6.4 oz (71.8 kg)  04/13/23 155 lb 6.4 oz (70.5 kg)  12/11/22 156 lb 15.5 oz (71.2 kg)     GEN:  Well nourished, well developed in no acute distress HEENT: Normal NECK: No JVD; No carotid bruits CARDIAC: RRR, no murmurs, rubs, gallops RESPIRATORY:  Clear to auscultation without rales, wheezing or rhonchi  ABDOMEN: Soft, non-tender, non-distended MUSCULOSKELETAL:  No edema; No deformity. 2+ pedal pulses, equal bilaterally SKIN: Warm and dry NEUROLOGIC:  Alert and oriented x 3 PSYCHIATRIC:  Normal affect   EKG:  EKG is ordered today.  EKG reveals normal sinus rhythm at 81 bpm, cannot rule out anterior infarct, nonspecific TWI, no acute change from previous tracing  Diagnoses:    No diagnosis found.    Assessment and Plan:     Palpitations: SVT with longest run 9 beats  DOE: Symptoms of shortness of breath and DOE have worsened over the past 3 months.  Symptoms associated with chest pressure and lightheadedness as well. Unable to complete 1/4 mile walk without stopping to rest. Mildly reduced LVEF on echo 12/11/22. No evidence of significant volume overload.  No orthopnea, edema, or PND. Weight is stable. Will get CBC, TSH, CMET to ensure no abnormality that may be contributing to DOE/SOB. History of nonobstructive CAD on coronary CTA 11/2019. We will update coronary CTA to evaluate worsening ischemia. Consideration given to pulmonary testing, will await results of lung CT with coronary CTA that is ordered.  If no evidence of obstructive CAD, consider referral to pulmonology. Continue Lasix.   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 Could not tolerate [place]. She is having worsening SOB,  dyspnea as noted above. No edema, weight gain, orthopnea, or PND. Appears euvolemic on exam. Added spironolactone at last office visit however it was subsequently stopped during hospitalization for orthostatic hypotension.  BP is soft at times.  Will continue lisinopril and Lasix for now. Consider up titration of GDMT at next office visit.   Nonobstructive CAD: Coronary calcium score 146, 84th percentile for age/sex.  Mild nonobstructive calcified plaque in mid LAD (25 to 49%) on CCTA 11/2019. Low risk nuclear stress test 09/2020. She reports worsening shortness of breath and dyspnea on exertion. Has occasional chest tightness and lightheadedness that accompany DOE.  Walks frequently for exercise, has to stop to rest during 1/4 miles walk.  We will repeat coronary CTA to evaluate for worsening ischemia.  Will have her take metoprolol 50 mg 2 hours prior.  Hypertension: BP initially slightly elevated, improved upon my recheck.  She was previously placed on spironolactone in October 2023 which was stopped during admission for orthostatic hypotension 12/2022.  BP is well-controlled today.  We will continue lisinopril 5 mg daily and Lasix 20 mg daily.   Hyperlipidemia LDL goal < 70: LDL 43 on 05/17/22. We will recheck today. Continue atorvastatin.      Disposition: ***  Medication Adjustments/Labs and Tests Ordered: Current medicines are reviewed at length with the patient today.  Concerns regarding medicines are outlined above.  No orders of the defined types were placed in this encounter.  No orders of the defined types were placed in this encounter.   There are no Patient Instructions on file for this visit.   Signed, Levi Aland, NP  07/24/2023 5:58 AM    [facility]

## 2023-07-25 ENCOUNTER — Encounter: Payer: Self-pay | Admitting: Nurse Practitioner

## 2023-07-25 ENCOUNTER — Ambulatory Visit: Payer: Medicare HMO | Attending: Nurse Practitioner | Admitting: Nurse Practitioner

## 2023-08-01 DIAGNOSIS — D414 Neoplasm of uncertain behavior of bladder: Secondary | ICD-10-CM | POA: Diagnosis not present

## 2023-08-03 DIAGNOSIS — R150 Incomplete defecation: Secondary | ICD-10-CM | POA: Diagnosis not present

## 2023-08-03 DIAGNOSIS — M6289 Other specified disorders of muscle: Secondary | ICD-10-CM | POA: Diagnosis not present

## 2023-08-03 DIAGNOSIS — M6281 Muscle weakness (generalized): Secondary | ICD-10-CM | POA: Diagnosis not present

## 2023-08-03 DIAGNOSIS — N3946 Mixed incontinence: Secondary | ICD-10-CM | POA: Diagnosis not present

## 2023-08-06 DIAGNOSIS — N390 Urinary tract infection, site not specified: Secondary | ICD-10-CM | POA: Diagnosis not present

## 2023-08-17 ENCOUNTER — Ambulatory Visit
Admission: RE | Admit: 2023-08-17 | Discharge: 2023-08-17 | Disposition: A | Discharge status: Home / Self Care | Payer: Medicare HMO | Source: Ambulatory Visit | Attending: Physician Assistant | Admitting: Physician Assistant

## 2023-08-17 DIAGNOSIS — N6315 Unspecified lump in the right breast, overlapping quadrants: Secondary | ICD-10-CM | POA: Diagnosis not present

## 2023-08-17 DIAGNOSIS — N63 Unspecified lump in unspecified breast: Secondary | ICD-10-CM

## 2023-08-17 DIAGNOSIS — N641 Fat necrosis of breast: Secondary | ICD-10-CM | POA: Diagnosis not present

## 2023-09-06 DIAGNOSIS — R6883 Chills (without fever): Secondary | ICD-10-CM | POA: Diagnosis not present

## 2023-09-06 DIAGNOSIS — J018 Other acute sinusitis: Secondary | ICD-10-CM | POA: Diagnosis not present

## 2023-09-06 DIAGNOSIS — R52 Pain, unspecified: Secondary | ICD-10-CM | POA: Diagnosis not present

## 2023-09-06 DIAGNOSIS — Z03818 Encounter for observation for suspected exposure to other biological agents ruled out: Secondary | ICD-10-CM | POA: Diagnosis not present

## 2023-09-06 DIAGNOSIS — R051 Acute cough: Secondary | ICD-10-CM | POA: Diagnosis not present

## 2023-09-06 DIAGNOSIS — J029 Acute pharyngitis, unspecified: Secondary | ICD-10-CM | POA: Diagnosis not present

## 2023-09-06 DIAGNOSIS — R5383 Other fatigue: Secondary | ICD-10-CM | POA: Diagnosis not present

## 2023-09-15 DIAGNOSIS — R3915 Urgency of urination: Secondary | ICD-10-CM | POA: Diagnosis not present

## 2023-09-25 DIAGNOSIS — D414 Neoplasm of uncertain behavior of bladder: Secondary | ICD-10-CM | POA: Diagnosis not present

## 2023-09-25 DIAGNOSIS — N3946 Mixed incontinence: Secondary | ICD-10-CM | POA: Diagnosis not present

## 2023-09-25 DIAGNOSIS — N8111 Cystocele, midline: Secondary | ICD-10-CM | POA: Diagnosis not present

## 2023-10-03 DIAGNOSIS — Z79899 Other long term (current) drug therapy: Secondary | ICD-10-CM | POA: Diagnosis not present

## 2023-10-03 DIAGNOSIS — F324 Major depressive disorder, single episode, in partial remission: Secondary | ICD-10-CM | POA: Diagnosis not present

## 2023-10-03 DIAGNOSIS — K581 Irritable bowel syndrome with constipation: Secondary | ICD-10-CM | POA: Diagnosis not present

## 2023-10-03 DIAGNOSIS — I10 Essential (primary) hypertension: Secondary | ICD-10-CM | POA: Diagnosis not present

## 2023-10-03 DIAGNOSIS — F419 Anxiety disorder, unspecified: Secondary | ICD-10-CM | POA: Diagnosis not present

## 2023-10-03 DIAGNOSIS — E559 Vitamin D deficiency, unspecified: Secondary | ICD-10-CM | POA: Diagnosis not present

## 2023-10-03 DIAGNOSIS — I5040 Unspecified combined systolic (congestive) and diastolic (congestive) heart failure: Secondary | ICD-10-CM | POA: Diagnosis not present

## 2023-10-03 DIAGNOSIS — G4733 Obstructive sleep apnea (adult) (pediatric): Secondary | ICD-10-CM | POA: Diagnosis not present

## 2023-10-03 DIAGNOSIS — K219 Gastro-esophageal reflux disease without esophagitis: Secondary | ICD-10-CM | POA: Diagnosis not present

## 2023-10-03 DIAGNOSIS — I700 Atherosclerosis of aorta: Secondary | ICD-10-CM | POA: Diagnosis not present

## 2023-10-03 DIAGNOSIS — I110 Hypertensive heart disease with heart failure: Secondary | ICD-10-CM | POA: Diagnosis not present

## 2023-10-03 DIAGNOSIS — Z0000 Encounter for general adult medical examination without abnormal findings: Secondary | ICD-10-CM | POA: Diagnosis not present

## 2023-10-03 DIAGNOSIS — E785 Hyperlipidemia, unspecified: Secondary | ICD-10-CM | POA: Diagnosis not present

## 2023-10-03 DIAGNOSIS — R7303 Prediabetes: Secondary | ICD-10-CM | POA: Diagnosis not present

## 2023-10-04 ENCOUNTER — Other Ambulatory Visit: Payer: Self-pay | Admitting: Family Medicine

## 2023-10-04 DIAGNOSIS — E2839 Other primary ovarian failure: Secondary | ICD-10-CM

## 2023-10-11 ENCOUNTER — Other Ambulatory Visit: Payer: Self-pay | Admitting: Urology

## 2023-10-12 DIAGNOSIS — M6289 Other specified disorders of muscle: Secondary | ICD-10-CM | POA: Diagnosis not present

## 2023-10-12 DIAGNOSIS — M62838 Other muscle spasm: Secondary | ICD-10-CM | POA: Diagnosis not present

## 2023-10-12 DIAGNOSIS — N3946 Mixed incontinence: Secondary | ICD-10-CM | POA: Diagnosis not present

## 2023-10-12 DIAGNOSIS — M6281 Muscle weakness (generalized): Secondary | ICD-10-CM | POA: Diagnosis not present

## 2023-10-12 DIAGNOSIS — K5900 Constipation, unspecified: Secondary | ICD-10-CM | POA: Diagnosis not present

## 2023-10-12 DIAGNOSIS — R351 Nocturia: Secondary | ICD-10-CM | POA: Diagnosis not present

## 2023-10-26 ENCOUNTER — Ambulatory Visit: Payer: Medicare HMO | Admitting: Cardiology

## 2023-10-30 DIAGNOSIS — R1032 Left lower quadrant pain: Secondary | ICD-10-CM | POA: Diagnosis not present

## 2023-11-10 ENCOUNTER — Other Ambulatory Visit: Payer: Self-pay | Admitting: Urology

## 2023-11-12 ENCOUNTER — Other Ambulatory Visit: Payer: Self-pay | Admitting: Cardiology

## 2023-11-30 NOTE — Progress Notes (Addendum)
COVID Vaccine Completed: yes  Date of COVID positive in last 90 days: no  PCP - Laurann Montana, MD Cardiologist - Armanda Magic, MD LOV 05/25/23  Chest x-ray - 12/11/22 Epic EKG - 04/13/23 Epic Stress Test - 09/16/20 Epic ECHO - 12/11/22 Epic Heart monitor- 06/21/23 Epic Cardiac Cath - n/a Pacemaker/ICD device last checked: n/a Spinal Cord Stimulator: n/a  Bowel Prep - Miralax, colace and clears days before  Sleep Study - yes CPAP - no  Fasting Blood Sugar - preDM, no checks or meds at home Checks Blood Sugar _____ times a day  Last dose of GLP1 agonist-  N/A GLP1 instructions:  Hold 7 days before surgery    Last dose of SGLT-2 inhibitors-  N/A SGLT-2 instructions:  Hold 3 days before surgery    Blood Thinner Instructions:  Time Aspirin Instructions: ASA 81, hold 7 days Last Dose:  Activity level: Can go up a flight of stairs and perform activities of daily living without stopping and without symptoms of chest pain or shortness of breath. Gets worn out after stairs  Anesthesia review: HTN, CAD, SOB, syncope, Anemia, Urine culture positive for staph  Patient denies shortness of breath, fever, cough and chest pain at PAT appointment  Patient verbalized understanding of instructions that were given to them at the PAT appointment. Patient was also instructed that they will need to review over the PAT instructions again at home before surgery.

## 2023-11-30 NOTE — Patient Instructions (Addendum)
SURGICAL WAITING ROOM VISITATION  Patients having surgery or a procedure may have no more than 2 support people in the waiting area - these visitors may rotate.    Children under the age of 7 must have an adult with them who is not the patient.  Due to an increase in RSV and influenza rates and associated hospitalizations, children ages 78 and under may not visit patients in [facility] hospitals.  If the patient needs to stay at the hospital during part of their recovery, the visitor guidelines for inpatient rooms apply. Pre-op nurse will coordinate an appropriate time for 1 support person to accompany patient in pre-op.  This support person may not rotate.    Please refer to the [facility] website for the visitor guidelines for Inpatients (after your surgery is over and you are in a regular room).    Your procedure is scheduled on: 12/08/23   Report to [facility] Main Entrance    Report to admitting at 5:15 AM   Call this number if you have problems the morning of surgery [phone]   Follow a clear liquid diet the day before surgery  Water Non-Citrus Juices (without pulp, NO RED-Apple, White grape, White cranberry) Black Coffee (NO MILK/CREAM OR CREAMERS, sugar ok)  Clear Tea (NO MILK/CREAM OR CREAMERS, sugar ok) regular and decaf                             Plain Jell-O (NO RED)                                           Fruit ices (not with fruit pulp, NO RED)                                     Popsicles (NO RED)                                                               Sports drinks like Gatorade (NO RED)              Nothing to drink after midnight.          If you have questions, please contact your surgeon's office.   FOLLOW BOWEL PREP AND ANY ADDITIONAL PRE OP INSTRUCTIONS YOU RECEIVED FROM YOUR SURGEON'S OFFICE!!!     Oral Hygiene is also important to reduce your risk of infection.                                    Remember - BRUSH YOUR TEETH THE  MORNING OF SURGERY WITH YOUR REGULAR TOOTHPASTE  DENTURES WILL BE REMOVED PRIOR TO SURGERY PLEASE DO NOT APPLY "Poly grip" OR ADHESIVES!!!   Stop all vitamins and herbal supplements 7 days before surgery.   Take these medicines the morning of surgery with A SIP OF WATER: Atorvastatin, Zyrtec, Zetia, Famotidine, Isosorbide, Metoprolol, Pantoprazole              You may  not have any metal on your body including hair pins, jewelry, and body piercing             Do not wear make-up, lotions, powders, perfumes, or deodorant  Do not wear nail polish including gel and S&S, artificial/acrylic nails, or any other type of covering on natural nails including finger and toenails. If you have artificial nails, gel coating, etc. that needs to be removed by a nail salon please have this removed prior to surgery or surgery may need to be canceled/ delayed if the surgeon/ anesthesia feels like they are unable to be safely monitored.   Do not shave  48 hours prior to surgery.    Do not bring valuables to the hospital. [facility] IS NOT             RESPONSIBLE   FOR VALUABLES.   Contacts, glasses, dentures or bridgework may not be worn into surgery.   Bring small overnight bag day of surgery.   DO NOT BRING YOUR HOME MEDICATIONS TO THE HOSPITAL. PHARMACY WILL DISPENSE MEDICATIONS LISTED ON YOUR MEDICATION LIST TO YOU DURING YOUR ADMISSION IN THE HOSPITAL!   Special Instructions: Bring a copy of your healthcare power of attorney and living will documents the day of surgery if you haven't scanned them before.              Please read over the following fact sheets you were given: IF YOU HAVE QUESTIONS ABOUT YOUR PRE-OP INSTRUCTIONS PLEASE CALL [name]    If you received a COVID test during your pre-op visit  it is requested that you wear a mask when out in public, stay away from anyone that may not be feeling well and notify your surgeon if you develop symptoms. If you test positive for Covid  or have been in contact with anyone that has tested positive in the last 10 days please notify you surgeon.    [place] - Preparing for Surgery Before surgery, you can play an important role.  Because skin is not sterile, your skin needs to be as free of germs as possible.  You can reduce the number of germs on your skin by washing with CHG (chlorahexidine gluconate) soap before surgery.  CHG is an antiseptic cleaner which kills germs and bonds with the skin to continue killing germs even after washing. Please DO NOT use if you have an allergy to CHG or antibacterial soaps.  If your skin becomes reddened/irritated stop using the CHG and inform your nurse when you arrive at Short Stay. Do not shave (including legs and underarms) for at least 48 hours prior to the first CHG shower.  You may shave your face/neck.  Please follow these instructions carefully:  1.  Shower with CHG Soap the night before surgery and the  morning of surgery.  2.  If you choose to wash your hair, wash your hair first as usual with your normal  shampoo.  3.  After you shampoo, rinse your hair and body thoroughly to remove the shampoo.                             4.  Use CHG as you would any other liquid soap.  You can apply chg directly to the skin and wash.  Gently with a scrungie or clean washcloth.  5.  Apply the CHG Soap to your body ONLY FROM THE NECK DOWN.   Do  not use on face/ open                           Wound or open sores. Avoid contact with eyes, ears mouth and   genitals (private parts).                       Wash face,  Genitals (private parts) with your normal soap.             6.  Wash thoroughly, paying special attention to the area where your    surgery  will be performed.  7.  Thoroughly rinse your body with warm water from the neck down.  8.  DO NOT shower/wash with your normal soap after using and rinsing off the CHG Soap.                9.  Pat yourself dry with a clean towel.            10.  Wear  clean pajamas.            11.  Place clean sheets on your bed the night of your first shower and do not  sleep with pets. Day of Surgery : Do not apply any lotions/deodorants the morning of surgery.  Please wear clean clothes to the hospital/surgery center.  FAILURE TO FOLLOW THESE INSTRUCTIONS MAY RESULT IN THE CANCELLATION OF YOUR SURGERY  PATIENT SIGNATURE_________________________________  NURSE SIGNATURE__________________________________  ________________________________________________________________________ WHAT IS A BLOOD TRANSFUSION? Blood Transfusion Information  A transfusion is the replacement of blood or some of its parts. Blood is made up of multiple cells which provide different functions. Red blood cells carry oxygen and are used for blood loss replacement. White blood cells fight against infection. Platelets control bleeding. Plasma helps clot blood. Other blood products are available for specialized needs, such as hemophilia or other clotting disorders. BEFORE THE TRANSFUSION  Who gives blood for transfusions?  Healthy volunteers who are fully evaluated to make sure their blood is safe. This is blood bank blood. Transfusion therapy is the safest it has ever been in the practice of medicine. Before blood is taken from a donor, a complete history is taken to make sure that person has no history of diseases nor engages in risky social behavior (examples are intravenous drug use or sexual activity with multiple partners). The donor's travel history is screened to minimize risk of transmitting infections, such as malaria. The donated blood is tested for signs of infectious diseases, such as HIV and hepatitis. The blood is then tested to be sure it is compatible with you in order to minimize the chance of a transfusion reaction. If you or a relative donates blood, this is often done in anticipation of surgery and is not appropriate for emergency situations. It takes many days to  process the donated blood. RISKS AND COMPLICATIONS Although transfusion therapy is very safe and saves many lives, the main dangers of transfusion include:  Getting an infectious disease. Developing a transfusion reaction. This is an allergic reaction to something in the blood you were given. Every precaution is taken to prevent this. The decision to have a blood transfusion has been considered carefully by your caregiver before blood is given. Blood is not given unless the benefits outweigh the risks. AFTER THE TRANSFUSION Right after receiving a blood transfusion, you will usually feel much better and more energetic. This is especially true if your red blood  cells have gotten low (anemic). The transfusion raises the level of the red blood cells which carry oxygen, and this usually causes an energy increase. The nurse administering the transfusion will monitor you carefully for complications. HOME CARE INSTRUCTIONS  No special instructions are needed after a transfusion. You may find your energy is better. Speak with your caregiver about any limitations on activity for underlying diseases you may have. SEEK MEDICAL CARE IF:  Your condition is not improving after your transfusion. You develop redness or irritation at the intravenous (IV) site. SEEK IMMEDIATE MEDICAL CARE IF:  Any of the following symptoms occur over the next 12 hours: Shaking chills. You have a temperature by mouth above 102 F (38.9 C), not controlled by medicine. Chest, back, or muscle pain. People around you feel you are not acting correctly or are confused. Shortness of breath or difficulty breathing. Dizziness and fainting. You get a rash or develop hives. You have a decrease in urine output. Your urine turns a dark color or changes to pink, red, or brown. Any of the following symptoms occur over the next 10 days: You have a temperature by mouth above 102 F (38.9 C), not controlled by medicine. Shortness of  breath. Weakness after normal activity. The white part of the eye turns yellow (jaundice). You have a decrease in the amount of urine or are urinating less often. Your urine turns a dark color or changes to pink, red, or brown. Document Released: 11/18/2000 Document Revised: 02/13/2012 Document Reviewed: 07/07/2008 [facility] Patient Information 2014 [place], [place].  _______________________________________________________________________

## 2023-12-01 ENCOUNTER — Other Ambulatory Visit: Payer: Self-pay

## 2023-12-01 ENCOUNTER — Encounter (HOSPITAL_COMMUNITY)
Admission: RE | Admit: 2023-12-01 | Discharge: 2023-12-01 | Disposition: A | Discharge status: Home / Self Care | Payer: Medicare HMO | Source: Ambulatory Visit | Attending: Urology

## 2023-12-01 ENCOUNTER — Encounter (HOSPITAL_COMMUNITY): Payer: Self-pay

## 2023-12-01 DIAGNOSIS — Z01818 Encounter for other preprocedural examination: Secondary | ICD-10-CM | POA: Diagnosis present

## 2023-12-01 DIAGNOSIS — Z01812 Encounter for preprocedural laboratory examination: Secondary | ICD-10-CM | POA: Diagnosis not present

## 2023-12-01 HISTORY — DX: Sleep apnea, unspecified: G47.30

## 2023-12-01 LAB — COMPREHENSIVE METABOLIC PANEL
ALT: 28 U/L (ref 0–44)
AST: 32 U/L (ref 15–41)
Albumin: 3.3 g/dL — ABNORMAL LOW (ref 3.5–5.0)
Alkaline Phosphatase: 59 U/L (ref 38–126)
Anion gap: 7 (ref 5–15)
BUN: 13 mg/dL (ref 8–23)
CO2: 28 mmol/L (ref 22–32)
Calcium: 8.9 mg/dL (ref 8.9–10.3)
Chloride: 106 mmol/L (ref 98–111)
Creatinine, Ser: 0.61 mg/dL (ref 0.44–1.00)
GFR, Estimated: >60 mL/min (ref >60)
Glucose, Bld: 96 mg/dL (ref 70–99)
Potassium: 4.6 mmol/L (ref 3.5–5.1)
Sodium: 141 mmol/L (ref 135–145)
Total Bilirubin: 0.9 mg/dL (ref <1.2)
Total Protein: 6.2 g/dL — ABNORMAL LOW (ref 6.5–8.1)

## 2023-12-01 LAB — CBC
HCT: 38.8 % (ref 36.0–46.0)
Hemoglobin: 12.2 g/dL (ref 12.0–15.0)
MCH: 27.3 pg (ref 26.0–34.0)
MCHC: 31.4 g/dL (ref 30.0–36.0)
MCV: 86.8 fL (ref 80.0–100.0)
Platelets: 271 10*3/uL (ref 150–400)
RBC: 4.47 MIL/uL (ref 3.87–5.11)
RDW: 14.6 % (ref 11.5–15.5)
WBC: 6.6 10*3/uL (ref 4.0–10.5)
nRBC: 0 % (ref 0.0–0.2)

## 2023-12-04 ENCOUNTER — Encounter (HOSPITAL_COMMUNITY): Payer: Self-pay | Admitting: Medical

## 2023-12-04 ENCOUNTER — Encounter (HOSPITAL_COMMUNITY): Payer: Self-pay

## 2023-12-04 LAB — URINE CULTURE

## 2023-12-07 ENCOUNTER — Encounter (HOSPITAL_COMMUNITY): Payer: Self-pay

## 2023-12-08 ENCOUNTER — Ambulatory Visit (HOSPITAL_COMMUNITY): Admission: RE | Admit: 2023-12-08 | Payer: Medicare (Managed Care) | Source: Home / Self Care | Admitting: Urology

## 2023-12-08 ENCOUNTER — Encounter (HOSPITAL_COMMUNITY): Admission: RE | Payer: Self-pay | Source: Home / Self Care

## 2023-12-08 LAB — TYPE AND SCREEN
ABO/RH(D): A POS
Antibody Screen: NEGATIVE

## 2023-12-08 SURGERY — XI ROBOTIC ASSISTED LAPAROSCOPIC SACROCOLPOPEXY
Anesthesia: General

## 2023-12-08 NOTE — Progress Notes (Deleted)
 Cardiology Office Note:  .   Date:  12/08/2023  ID:  Debbie [name], DOB 1951/02/17, MRN [id number] PCP: Teresa Channel, MD  [facility] Providers Cardiologist:  Wilbert Bihari, MD { Click to update primary MD,subspecialty MD or APP then REFRESH:1}   Patient Profile: .      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  Nuclear stress test in 2021 showed mild ST depression less than 1 mm at rest, low risk study.  She has had problems at times with hypotension.  She reported shortness of breath and due to risk factors coronary CT was performed 11/07/2019 with results as outlined above.  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  Aspirin  was added.  Seen by me 08/18/2022 with increased DOE over the previous 6 months.  She was not having any orthopnea, edema, or PND.  2D echo 08/30/2022 revealed mildly reduced LVEF 45 to 50% which was a decline from normal function on previous echo 09/2020.  She was started on Farxiga  10 mg daily but unfortunately developed yeast infection.  She reported she did feel better while taking it.  She continued to have shortness of breath, most noticeable when lying down.  She was walking 30 minutes daily but admitted it was not it a very fast-pace.  He was advised to start spironolactone  25 mg once daily.  She was in the ED 12/2022 for orthostatic hypotension felt to be secondary  to dehydration and spironolactone  and Lasix  were stopped.  She had had recent bout of diarrhea.  Cardiology clinic visit with me 04/13/2023 at which time she reported feeling more short of breath with minimal activity.  Repeat echo 12/11/2022 revealed slight improvement in LVEF to 50 to 55%.  She was on lisinopril  and Lasix  at the time.  She was walking a quarter of a mile several days per week but was having to stop to rest before completing the walk.  She was also getting lightheaded.  Volume status was stable.  Coronary CTA was repeated to evaluate for worsening ischemia and revealed 50 to 69% proximal PDA stenosis with FFR that revealed nonflow limiting.  Seen by Dr. Bihari 05/25/2023 for follow-up.  Imdur  was increased to 60 mg daily.  She reported palpitations occurring when she was doing housework, 14-day ZIO ordered and completed 06/22/2023.  Cardiac monitor revealed predominant normal sinus rhythm with average HR 79 bpm, several episodes of nonsustained atrial tachycardia with longest lasting 9 beats, rare PACs and PVCs.  She was advised to start Toprol -XL 25 mg daily and return in 4 weeks for follow-up.       History of Present Illness: .   Debbie [name] is a *** 73 y.o. female ***   Discussed the use of AI scribe software for clinical note transcription with the patient, who gave verbal consent to proceed.   ROS: ***       Studies Reviewed: .        *** Risk Assessment/Calculations:   {Does  this patient have ATRIAL FIBRILLATION?[phone]} No BP recorded.  {Refresh Note OR Click here to enter BP  :1}***       Physical Exam:   VS:  There were no vitals taken for this visit.   Wt Readings from Last 3 Encounters:  12/01/23 156 lb (70.8 kg)  05/25/23 158 lb 6.4 oz (71.8 kg)  04/13/23 155 lb 6.4 oz (70.5 kg)    GEN: Well nourished, well developed in no acute distress NECK: No JVD; No carotid bruits CARDIAC: ***RRR, no murmurs, rubs, gallops RESPIRATORY:  Clear to  auscultation without rales, wheezing or rhonchi  ABDOMEN: Soft, non-tender, non-distended EXTREMITIES:  No edema; No deformity     ASSESSMENT AND PLAN: .    CAD: HFmrEF: Palpitations: Hypertension: Hyperlipidemia LDL goal < 70: Lipid profile 04/13/2023 revealed total cholesterol 122, triglycerides 83, HDL 69, and LDL-C 37.    {Are you ordering a CV Procedure (e.g. stress test, cath, DCCV, TEE, etc)?   Press F2        :[id number]}  Dispo: ***  Signed, Rosaline Bane, NP-C

## 2023-12-18 ENCOUNTER — Ambulatory Visit: Payer: Medicare (Managed Care) | Admitting: Nurse Practitioner

## 2023-12-21 ENCOUNTER — Ambulatory Visit: Payer: Medicare (Managed Care) | Admitting: Cardiology

## 2024-01-08 DIAGNOSIS — R197 Diarrhea, unspecified: Secondary | ICD-10-CM | POA: Diagnosis not present

## 2024-01-08 DIAGNOSIS — K623 Rectal prolapse: Secondary | ICD-10-CM | POA: Diagnosis not present

## 2024-01-09 DIAGNOSIS — R197 Diarrhea, unspecified: Secondary | ICD-10-CM | POA: Diagnosis not present

## 2024-01-18 DIAGNOSIS — R195 Other fecal abnormalities: Secondary | ICD-10-CM | POA: Diagnosis not present

## 2024-01-18 DIAGNOSIS — K648 Other hemorrhoids: Secondary | ICD-10-CM | POA: Diagnosis not present

## 2024-01-18 DIAGNOSIS — R194 Change in bowel habit: Secondary | ICD-10-CM | POA: Diagnosis not present

## 2024-01-18 DIAGNOSIS — K6389 Other specified diseases of intestine: Secondary | ICD-10-CM | POA: Diagnosis not present

## 2024-01-18 DIAGNOSIS — K5730 Diverticulosis of large intestine without perforation or abscess without bleeding: Secondary | ICD-10-CM | POA: Diagnosis not present

## 2024-01-18 DIAGNOSIS — K644 Residual hemorrhoidal skin tags: Secondary | ICD-10-CM | POA: Diagnosis not present

## 2024-01-31 ENCOUNTER — Other Ambulatory Visit: Payer: Self-pay

## 2024-01-31 MED ORDER — METOPROLOL SUCCINATE ER 25 MG PO TB24: 25.0000 mg | ORAL_TABLET | Freq: Every day | ORAL | 1 refills | Status: DC

## 2024-02-01 ENCOUNTER — Other Ambulatory Visit: Payer: Self-pay

## 2024-02-01 MED ORDER — ISOSORBIDE MONONITRATE ER 30 MG PO TB24
30.0000 mg | ORAL_TABLET | Freq: Every day | ORAL | 0 refills | Status: DC
Start: 2024-02-01 — End: 2024-03-18

## 2024-02-15 ENCOUNTER — Other Ambulatory Visit: Payer: Self-pay | Admitting: Urology

## 2024-02-20 ENCOUNTER — Ambulatory Visit (INDEPENDENT_AMBULATORY_CARE_PROVIDER_SITE_OTHER)

## 2024-02-20 ENCOUNTER — Ambulatory Visit: Payer: Medicare (Managed Care) | Attending: Cardiology | Admitting: Cardiology

## 2024-02-20 ENCOUNTER — Encounter: Payer: Self-pay | Admitting: Cardiology

## 2024-02-20 VITALS — BP 112/68 | HR 57 | Ht 63.0 in | Wt 157.8 lb

## 2024-02-20 DIAGNOSIS — I25118 Atherosclerotic heart disease of native coronary artery with other forms of angina pectoris: Secondary | ICD-10-CM | POA: Diagnosis not present

## 2024-02-20 DIAGNOSIS — R002 Palpitations: Secondary | ICD-10-CM | POA: Diagnosis not present

## 2024-02-20 DIAGNOSIS — E785 Hyperlipidemia, unspecified: Secondary | ICD-10-CM | POA: Diagnosis not present

## 2024-02-20 DIAGNOSIS — I951 Orthostatic hypotension: Secondary | ICD-10-CM

## 2024-02-20 DIAGNOSIS — I10 Essential (primary) hypertension: Secondary | ICD-10-CM

## 2024-02-20 DIAGNOSIS — R42 Dizziness and giddiness: Secondary | ICD-10-CM | POA: Diagnosis not present

## 2024-02-20 NOTE — Addendum Note (Signed)
 Addended by: Macie Burows on: 02/20/2024 11:16 AM   Modules accepted: Orders

## 2024-02-20 NOTE — Progress Notes (Addendum)
 Date:  02/20/2024   ID:  Debbie [name], DOB Sep 27, 1951, MRN [id number]  PCP:  Victorio Grave, MD  Cardiologist:  Gaylyn Keas, MD  Electrophysiologist:  None   Chief Complaint:  HTN, HLD, CAD  History of Present Illness:    Debbie [name] is a 73 y.o. female with a hx of HTN, HLD and ASCAD with coronary CTA 11/2019 with a calcium  score of 146 and mild nonobstructive plaque in the mid LAD.  She has been on ASA and statin.    She is here today for followup and is doing well.  She has chronic DOE that is unchanged. She denies any chest pain or pressure,  PND, orthopnea, LE edema, palpitations or syncope.   She has had some dizziness recently and sometimes feel like she is going to pass out but also feels like the room is spinning and feels off balance with her surroundings. She is compliant with her meds and is tolerating meds with no SE.    Prior CV studies:   The following studies were reviewed today:  Coronary CTA, 2D echo  Past Medical History:  Diagnosis Date   Acid reflux    ALLERGIC RHINITIS    Anxiety    CAD (coronary artery disease), native coronary artery    coronary CTA 11/2019 with a calcium  score of 146 and mild nonobstructive plaque in the mid LAD.   Depression    Diverticulosis    Dyspnea    with exertion    Esophageal stricture    Hiatal hernia    with  Schatzki's Ring   History of kidney stones    Hyperlipidemia    Hypertension    IBS (irritable bowel syndrome)    Sleep apnea    no CPAP   Past Surgical History:  Procedure Laterality Date   ABDOMINAL HYSTERECTOMY     ANKLE FRACTURE SURGERY  2012   right   COLONOSCOPY  multiple   ESOPHAGOGASTRODUODENOSCOPY  09/14/05   multiple   KIDNEY STONE SURGERY     TONSILLECTOMY     TUBAL LIGATION     VESICOVAGINAL FISTULA CLOSURE W/ TAH     XI ROBOTIC ASSISTED HIATAL HERNIA REPAIR N/A 01/13/2021   Procedure: ROBOTIC ASSISTED NISSEN FUNDOPLICATION;  Surgeon: Junie Olds, MD;  Location: [facility]  [facility];  Service: General;  Laterality: N/A;     Current Meds  Medication Sig   aspirin  EC 81 MG tablet Take 1 tablet (81 mg total) by mouth daily.   atorvastatin  (LIPITOR ) 80 MG tablet Take 80 mg by mouth daily.   cetirizine (ZYRTEC) 10 MG tablet Take 10 mg by mouth daily.   Cholecalciferol  (VITAMIN D ) 50 MCG (2000 UT) tablet Take 2,000 Units by mouth daily.   CYMBALTA  60 MG capsule Take 120 mg by mouth at bedtime.   diphenhydrAMINE (BENADRYL) 25 MG tablet Take 25 mg by mouth daily as needed for allergies.   ezetimibe  (ZETIA ) 10 MG tablet Take 1 tablet (10 mg total) by mouth daily.   fluticasone  (FLONASE ) 50 MCG/ACT nasal spray Place 2 sprays into both nostrils daily as needed for allergies or rhinitis.    ibuprofen  (ADVIL ) 200 MG tablet Take 400 mg by mouth every 6 (six) hours as needed for headache.   isosorbide  mononitrate (IMDUR ) 30 MG 24 hr tablet Take 1 tablet (30 mg total) by mouth daily.   lisinopril  (ZESTRIL ) 5 MG tablet Take 5 mg by mouth daily.   loperamide (IMODIUM A-D) 2 MG tablet Take 4  mg by mouth 4 (four) times daily as needed for diarrhea or loose stools.   metoprolol  succinate (TOPROL  XL) 25 MG 24 hr tablet Take 1 tablet (25 mg total) by mouth daily.   mirabegron ER (MYRBETRIQ) 50 MG TB24 tablet Take 50 mg by mouth daily.   Multiple Vitamins-Minerals (ADULT GUMMY PO) Take 1 capsule by mouth daily.   pantoprazole  (PROTONIX ) 40 MG tablet Take 40 mg by mouth 2 (two) times daily.   Pumpkin Seed-Soy Germ (AZO BLADDER CONTROL/GO-LESS) CAPS Take 1 capsule by mouth daily as needed (unrinary frequency).   traZODone (DESYREL) 50 MG tablet Take 50 mg by mouth at bedtime as needed for sleep.   triamcinolone  ointment (KENALOG ) 0.1 % Apply 1 Application topically 2 (two) times daily as needed (rash).     Allergies:   Patient has no known allergies.   Social History   Tobacco Use   Smoking status: Never   Smokeless tobacco: Never  Vaping Use   Vaping status: Never Used  Substance  Use Topics   Alcohol use: No   Drug use: No     Family Hx: The patient's family history includes Allergies in her daughter, mother, and son; Diabetes in her paternal grandmother; Heart disease in her father and mother; Hyperlipidemia in her father and mother; Hypertension in her father and mother; Stroke in her father. There is no history of Colon cancer, Esophageal cancer, Rectal cancer, or Stomach cancer.  ROS:   Please see the history of present illness.     All other systems reviewed and are negative.   Labs/Other Tests and Data Reviewed:    Recent Labs: 04/13/2023: TSH 2.600 12/01/2023: ALT 28; BUN 13; Creatinine, Ser 0.61; Hemoglobin 12.2; Platelets 271; Potassium 4.6; Sodium 141   Recent Lipid Panel Lab Results  Component Value Date/Time   CHOL 122 04/13/2023 09:26 AM   TRIG 83 04/13/2023 09:26 AM   HDL 69 04/13/2023 09:26 AM   CHOLHDL 1.8 04/13/2023 09:26 AM   LDLCALC 37 04/13/2023 09:26 AM    Wt Readings from Last 3 Encounters:  02/20/24 157 lb 12.8 oz (71.6 kg)  12/01/23 156 lb (70.8 kg)  05/25/23 158 lb 6.4 oz (71.8 kg)     Objective:    Vital Signs:  BP 112/68   Pulse (!) 57   Ht 5\' 3"  (1.6 m)   Wt 157 lb 12.8 oz (71.6 kg)   SpO2 94%   BMI 27.95 kg/m   GEN: Well nourished, well developed in no acute distress HEENT: Normal NECK: No JVD; No carotid bruits LYMPHATICS: No lymphadenopathy CARDIAC:RRR, no murmurs, rubs, gallops RESPIRATORY:  Clear to auscultation without rales, wheezing or rhonchi  ABDOMEN: Soft, non-tender, non-distended MUSCULOSKELETAL:  No edema; No deformity  SKIN: Warm and dry NEUROLOGIC:  Alert and oriented x 3 PSYCHIATRIC:  Normal affect   EKG Interpretation Date/Time:  Tuesday February 20 2024 10:25:14 EDT Ventricular Rate:  57 PR Interval:  126 QRS Duration:  82 QT Interval:  410 QTC Calculation: 399 R Axis:   0  Text Interpretation: Sinus bradycardia Possible Anterior infarct , age undetermined T wave abnormality, consider  lateral ischemia When compared with ECG of 11-Dec-2022 04:35, PREVIOUS ECG IS PRESENT Confirmed by Gaylyn Keas ([id number]) on 02/20/2024 10:40:23 AM   ASSESSMENT & PLAN:    ASCAD -coronary CTA 11/2019 with a calcium  score of 146 and mild nonobstructive plaque in the mid LAD. 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  She had had some chest discomfort during the day with SOB but FFR on Coronary CTA 04/2023 was normal -She has not had any further chest pain -Continue prescription drug management with aspirin  81 mg daily, atorvastatin  80 mg daily, Imdur  30 mg daily, Toprol -XL 25 mg daily with as needed refills  HTN -BP controlled on exam today -Continue prescription drug management with Toprol -XL 25 mg daily with as needed refills -Stop lisinopril  due to dizziness and orthostatic hypotension -I have personally reviewed and interpreted outside labs performed by patient's PCP which showed serum creatinine 0.61 and potassium 4.6 on 12/01/2023  HLD -LDL goal < 70 -I have personally reviewed and interpreted outside labs performed by patient's PCP which showed LDL 45, HDL 75 on 10/03/2023 and ALT 28 on 12/01/2023 -Continue prescription management with atorvastatin  80 mg daily and Zetia  10 mg daily with as needed refills  Palpitations -this seems to occur when she is doing housework and may be related to deconditioing -Heart monitor 06/21/2023 showed short runs of PAT lasting as long as 9 beats, rare PVCs and PACs -Palpitations have improved on beta-blocker therapy -Continue prescription drug management with Toprol -XL 25 mg daily with as needed  refills  Dizziness -she has a hard time describing her sx so difficult to tell if this is a balance issue or if it is presyncope -Orthostatic blood pressures were low and positive in the office today -Will stop lisinopril  -Will get a 2-week Zio patch to rule out arrhythmia -I encouraged her to check her blood pressure and heart rate when these episodes occur and let me know what they are -Will have her come back in for nurse visit for repeat orthostatics in 1 week -Add compression hose which I have given her prescription for today  Medication Adjustments/Labs and Tests Ordered: Current medicines are reviewed at length with the patient today.  Concerns regarding medicines are outlined above.  Tests Ordered: Orders Placed This Encounter  Procedures   LONG TERM MONITOR (3-14 DAYS)   EKG 12-Lead   Medication Changes: No orders of the defined types were placed in this encounter.   Disposition:  Follow up 4 to 6 weeks with PA in 1 year with me  Signed, Gaylyn Keas, MD  02/20/2024 11:04 AM    [facility]

## 2024-02-20 NOTE — Progress Notes (Unsigned)
 Enrolled for [employer] to mail a ZIO XT long term holter monitor to the patients address on file.

## 2024-02-20 NOTE — Patient Instructions (Addendum)
 Medication Instructions:  Your physician has recommended you make the following change in your medication:  STOP: lisinopril  *If you need a refill on your cardiac medications before your next appointment, please call your pharmacy*   Lab Work: NONE If you have labs (blood work) drawn today and your tests are completely normal, you will receive your results only by: MyChart Message (if you have MyChart) OR A paper copy in the mail If you have any lab test that is abnormal or we need to change your treatment, we will call you to review the results.   Testing/Procedures: Your physician has requested that you wear a heart monitor.   Your physician would like you to wear compression socks.  Our office has provided you with a prescription for compression stockings.   Your physician would like you to have a Nurse Visit in 1 week to check Orthostatic BP.    Follow-Up: At [facility], you and your health needs are our priority.  As part of our continuing mission to provide you with exceptional heart care, we have created designated Provider Care Teams.  These Care Teams include your primary Cardiologist (physician) and Advanced Practice Providers (APPs -  Physician Assistants and Nurse Practitioners) who all work together to provide you with the care you need, when you need it.    Your next appointment:   4-6 week(s)  Provider:   Jari Favre, PA-C, Ronie Spies, PA-C, Robin Searing, NP, Jacolyn Reedy, PA-C, Eligha Bridegroom, NP, Tereso Newcomer, PA-C, or Perlie Gold, PA-C     Then, Armanda Magic, MD will plan to see you again in 1 year(s).    Other Instructions Please be liberal with salt intake and consume at least 64 ounces of fluids daily.  ZIO XT- Long Term Monitor Instructions  Your physician has requested you wear a ZIO patch monitor for 14 days.  This is a single patch monitor. [employer] supplies one patch monitor per enrollment. Additional stickers are not available. Please  do not apply patch if you will be having a Nuclear Stress Test,   Cardiac CT, MRI, or Chest Xray during the period you would be wearing the  monitor. The patch cannot be worn during these tests. You cannot remove and re-apply the  ZIO XT patch monitor.  Your ZIO patch monitor will be mailed 3 day USPS to your address on file. It may take 3-5 days  to receive your monitor after you have been enrolled.  Once you have received your monitor, please review the enclosed instructions. Your monitor  has already been registered assigning a specific monitor serial # to you.  Billing and Patient Assistance Program Information  We have supplied [employer] with any of your insurance information on file for billing purposes. [employer] offers a sliding scale Patient Assistance Program for patients that do not have  insurance, or whose insurance does not completely cover the cost of the ZIO monitor.  You must apply for the Patient Assistance Program to qualify for this discounted rate.  To apply, please call [employer] at [phone], select option 4, select option 2, ask to apply for  Patient Assistance Program. Meredeth Ide will ask your household income, and how many people  are in your household. They will quote your out-of-pocket cost based on that information.  [employer] will also be able to set up a 42-month, interest-free payment plan if needed.  Applying the monitor   Shave hair from upper left chest.  Hold abrader disc by orange tab.  Rub abrader in 40 strokes over the upper left chest as  indicated in your monitor instructions.  Clean area with 4 enclosed alcohol pads. Let dry.  Apply patch as indicated in monitor instructions. Patch will be placed under collarbone on left  side of chest with arrow pointing upward.  Rub patch adhesive wings for 2 minutes. Remove white label marked "1". Remove the white  label marked "2". Rub patch adhesive wings for 2 additional minutes.  While looking in a mirror, press  and release button in center of patch. A small green light will  flash 3-4 times. This will be your only indicator that the monitor has been turned on.  Do not shower for the first 24 hours. You may shower after the first 24 hours.  Press the button if you feel a symptom. You will hear a small click. Record Date, Time and  Symptom in the Patient Logbook.  When you are ready to remove the patch, follow instructions on the last 2 pages of Patient  Logbook. Stick patch monitor onto the last page of Patient Logbook.  Place Patient Logbook in the blue and white box. Use locking tab on box and tape box closed  securely. The blue and white box has prepaid postage on it. Please place it in the mailbox as  soon as possible. Your physician should have your test results approximately 7 days after the  monitor has been mailed back to [facility].  Call [facility] Customer Care at [phone] if you have questions regarding  your ZIO XT patch monitor. Call them immediately if you see an orange light blinking on your  monitor.  If your monitor falls off in less than 4 days, contact our Monitor department at [phone].  If your monitor becomes loose or falls off after 4 days call [employer] at [phone] for  suggestions on securing your monitor

## 2024-02-27 ENCOUNTER — Ambulatory Visit: Attending: Cardiovascular Disease

## 2024-02-27 VITALS — BP 122/78 | HR 56 | Ht 63.0 in | Wt 155.4 lb

## 2024-02-27 DIAGNOSIS — I951 Orthostatic hypotension: Secondary | ICD-10-CM | POA: Diagnosis not present

## 2024-02-27 DIAGNOSIS — R42 Dizziness and giddiness: Secondary | ICD-10-CM | POA: Diagnosis not present

## 2024-02-27 NOTE — Progress Notes (Signed)
   Nurse Visit   Date of Encounter: 02/27/2024 ID: Nur Rabold, DOB 09/04/1951, MRN [id number]  PCP:  Victorio Grave, MD   [facility] Providers Cardiologist:  Gaylyn Keas, MD      Visit Details   VS:  BP 122/78 (BP Location: Right Arm, Patient Position: Supine, Cuff Size: Normal)   Pulse (!) 56   Ht 5\' 3"  (1.6 m)   Wt 155 lb 6.4 oz (70.5 kg)   SpO2 94%   BMI 27.53 kg/m  , BMI Body mass index is 27.53 kg/m.  Wt Readings from Last 3 Encounters:  02/27/24 155 lb 6.4 oz (70.5 kg)  02/20/24 157 lb 12.8 oz (71.6 kg)  12/01/23 156 lb (70.8 kg)     Reason for visit: Orthostatic BP check  Performed today: VS Changes (medications, testing, etc.) : no changes but pt asked to go ahead and obtain her compression stockings and to place her Zio as soon as it is delivered.. she had "technical" problems with the first one sent to her.  Length of Visit: 30 minutes    Medications Adjustments/Labs and Tests Ordered: No orders of the defined types were placed in this encounter.  No orders of the defined types were placed in this encounter.    Signed, Alanna Alley, RN  02/27/2024 11:46 AM

## 2024-02-27 NOTE — Patient Instructions (Signed)
 Medication Instructions:   *If you need a refill on your cardiac medications before your next appointment, please call your pharmacy*   Lab Work:  If you have labs (blood work) drawn today and your tests are completely normal, you will receive your results only by: MyChart Message (if you have MyChart) OR A paper copy in the mail If you have any lab test that is abnormal or we need to change your treatment, we will call you to review the results.   Testing/Procedures:    Follow-Up: At [facility], you and your health needs are our priority.  As part of our continuing mission to provide you with exceptional heart care, we have created designated Provider Care Teams.  These Care Teams include your primary Cardiologist (physician) and Advanced Practice Providers (APPs -  Physician Assistants and Nurse Practitioners) who all work together to provide you with the care you need, when you need it.  We recommend signing up for the patient portal called "MyChart".  Sign up information is provided on this After Visit Summary.  MyChart is used to connect with patients for Virtual Visits (Telemedicine).  Patients are able to view lab/test results, encounter notes, upcoming appointments, etc.  Non-urgent messages can be sent to your provider as well.   To learn more about what you can do with MyChart, go to [email or web address].

## 2024-03-16 ENCOUNTER — Other Ambulatory Visit: Payer: Self-pay | Admitting: Physician Assistant

## 2024-03-16 ENCOUNTER — Other Ambulatory Visit: Payer: Self-pay | Admitting: Cardiology

## 2024-03-18 NOTE — Progress Notes (Addendum)
 Anesthesia Review:  PCP: Cardiologist : Gaylyn Keas   PPM/ ICD: Device Orders: Rep Notified:  Chest x-ray : EKG : 02/20/2024  CT Cors- 05/03/23  Monitor- 02/20/2024  Echo : Stress test: 2021  Cardiac Cath :  12/11/22   Activity level:  Sleep Study/ CPAP : Fasting Blood Sugar :      / Checks Blood Sugar -- times a day:    Blood Thinner/ Instructions /Last Dose: ASA / Instructions/ Last Dose :   81 mg aspirin  Surgery cancelled 12/2023    Called and [facility]MM for Cecillia Cogan, Scheduler with [facility] and asked whether or not pt is to do 238g of miralax or 119g Miralax since order in epic has both listed.  Asked for call back.  Annah Barre called back from [facility] and [facility] and stated that the Miralax order should be for the 238g of Miralax.    03/19/24- 1402-LVMM 1415- LVMM PT was 30 minutes late for preop appt.   PT has another appt at 330pm with [facility].   Completed med hx, consents, preop instrucitons and vitals at preop appt.     Urine culture done 03/19/24 rotued to DR Dulcy Gibney on 03/21/24.

## 2024-03-18 NOTE — Patient Instructions (Signed)
 SURGICAL WAITING ROOM VISITATION  Patients having surgery or a procedure may have no more than 2 support people in the waiting area - these visitors may rotate.    Children under the age of 58 must have an adult with them who is not the patient.  Due to an increase in RSV and influenza rates and associated hospitalizations, children ages 48 and under may not visit patients in [facility] hospitals.  Visitors with respiratory illnesses are discouraged from visiting and should remain at home.  If the patient needs to stay at the hospital during part of their recovery, the visitor guidelines for inpatient rooms apply. Pre-op nurse will coordinate an appropriate time for 1 support person to accompany patient in pre-op.  This support person may not rotate.    Please refer to the [facility] website for the visitor guidelines for Inpatients (after your surgery is over and you are in a regular room).       Your procedure is scheduled on:  03/27/2024    Report to [facility] Main Entrance    Report to admitting at   [phone]   Call this number if you have problems the morning of surgery [phone]          Clear liquid diet the day before surgery.                Nothing after midnite.                Mix 238g of Miralax with 64 ounces of Gatorade and complete day before surgery.     Water Non-Citrus Juices (without pulp, NO RED-Apple, White grape, White cranberry) Black Coffee (NO MILK/CREAM OR CREAMERS, sugar ok)  Clear Tea (NO MILK/CREAM OR CREAMERS, sugar ok) regular and decaf                             Plain Jell-O (NO RED)                                           Fruit ices (not with fruit pulp, NO RED)                                     Popsicles (NO RED)                                                               Sports drinks like Gatorade (NO RED)                          If you have questions, please contact your surgeon's office.   FOLLOW BOWEL PREP AND ANY  ADDITIONAL PRE OP INSTRUCTIONS YOU RECEIVED FROM YOUR SURGEON'S OFFICE!!!     Oral Hygiene is also important to reduce your risk of infection.                                    Remember - BRUSH YOUR  TEETH THE MORNING OF SURGERY WITH YOUR REGULAR TOOTHPASTE  DENTURES WILL BE REMOVED PRIOR TO SURGERY PLEASE DO NOT APPLY "Poly grip" OR ADHESIVES!!!   Do NOT smoke after Midnight   Stop all vitamins and herbal supplements 7 days before surgery.   Take these medicines the morning of surgery with A SIP OF WATER:  zyrtec, imdur, toprol, protonix, myrbetriq   DO NOT TAKE ANY ORAL DIABETIC MEDICATIONS DAY OF YOUR SURGERY  Bring CPAP mask and tubing day of surgery.                              You may not have any metal on your body including hair pins, jewelry, and body piercing             Do not wear make-up, lotions, powders, perfumes/cologne, or deodorant  Do not wear nail polish including gel and S&S, artificial/acrylic nails, or any other type of covering on natural nails including finger and toenails. If you have artificial nails, gel coating, etc. that needs to be removed by a nail salon please have this removed prior to surgery or surgery may need to be canceled/ delayed if the surgeon/ anesthesia feels like they are unable to be safely monitored.   Do not shave  48 hours prior to surgery.               Men may shave face and neck.   Do not bring valuables to the hospital. [facility] IS NOT             RESPONSIBLE   FOR VALUABLES.   Contacts, glasses, dentures or bridgework may not be worn into surgery.   Bring small overnight bag day of surgery.   DO NOT BRING YOUR HOME MEDICATIONS TO THE HOSPITAL. PHARMACY WILL DISPENSE MEDICATIONS LISTED ON YOUR MEDICATION LIST TO YOU DURING YOUR ADMISSION IN THE HOSPITAL!    Patients discharged on the day of surgery will not be allowed to drive home.  Someone NEEDS to stay with you for the first 24 hours after anesthesia.   Special  Instructions: Bring a copy of your healthcare power of attorney and living will documents the day of surgery if you haven't scanned them before.              Please read over the following fact sheets you were given: IF YOU HAVE QUESTIONS ABOUT YOUR PRE-OP INSTRUCTIONS PLEASE CALL [phone]   If you received a COVID test during your pre-op visit  it is requested that you wear a mask when out in public, stay away from anyone that may not be feeling well and notify your surgeon if you develop symptoms. If you test positive for Covid or have been in contact with anyone that has tested positive in the last 10 days please notify you surgeon.    [facility] - Preparing for Surgery Before surgery, you can play an important role.  Because skin is not sterile, your skin needs to be as free of germs as possible.  You can reduce the number of germs on your skin by washing with CHG (chlorahexidine gluconate) soap before surgery.  CHG is an antiseptic cleaner which kills germs and bonds with the skin to continue killing germs even after washing. Please DO NOT use if you have an allergy to CHG or antibacterial soaps.  If your skin becomes reddened/irritated stop using the CHG and inform your nurse when you  arrive at Short Stay. Do not shave (including legs and underarms) for at least 48 hours prior to the first CHG shower.  You may shave your face/neck. Please follow these instructions carefully:  1.  Shower with CHG Soap the night before surgery and the  morning of Surgery.  2.  If you choose to wash your hair, wash your hair first as usual with your  normal  shampoo.  3.  After you shampoo, rinse your hair and body thoroughly to remove the  shampoo.                           4.  Use CHG as you would any other liquid soap.  You can apply chg directly  to the skin and wash                       Gently with a scrungie or clean washcloth.  5.  Apply the CHG Soap to your body ONLY FROM THE NECK DOWN.   Do not use  on face/ open                           Wound or open sores. Avoid contact with eyes, ears mouth and genitals (private parts).                       Wash face,  Genitals (private parts) with your normal soap.             6.  Wash thoroughly, paying special attention to the area where your surgery  will be performed.  7.  Thoroughly rinse your body with warm water from the neck down.  8.  DO NOT shower/wash with your normal soap after using and rinsing off  the CHG Soap.                9.  Pat yourself dry with a clean towel.            10.  Wear clean pajamas.            11.  Place clean sheets on your bed the night of your first shower and do not  sleep with pets. Day of Surgery : Do not apply any lotions/deodorants the morning of surgery.  Please wear clean clothes to the hospital/surgery center.  FAILURE TO FOLLOW THESE INSTRUCTIONS MAY RESULT IN THE CANCELLATION OF YOUR SURGERY PATIENT SIGNATURE_________________________________  NURSE SIGNATURE__________________________________  ________________________________________________________________________

## 2024-03-19 ENCOUNTER — Other Ambulatory Visit: Payer: Self-pay

## 2024-03-19 ENCOUNTER — Encounter (HOSPITAL_COMMUNITY)
Admission: RE | Admit: 2024-03-19 | Discharge: 2024-03-19 | Disposition: A | Discharge status: Home / Self Care | Source: Ambulatory Visit | Attending: Urology | Admitting: Urology

## 2024-03-19 ENCOUNTER — Encounter (HOSPITAL_COMMUNITY): Payer: Self-pay

## 2024-03-19 VITALS — BP 153/75 | HR 55 | Temp 98.1°F | Resp 16 | Ht 63.0 in | Wt 154.3 lb

## 2024-03-19 DIAGNOSIS — Z01812 Encounter for preprocedural laboratory examination: Secondary | ICD-10-CM | POA: Diagnosis not present

## 2024-03-19 DIAGNOSIS — D414 Neoplasm of uncertain behavior of bladder: Secondary | ICD-10-CM | POA: Diagnosis not present

## 2024-03-19 DIAGNOSIS — I10 Essential (primary) hypertension: Secondary | ICD-10-CM | POA: Insufficient documentation

## 2024-03-19 DIAGNOSIS — R8271 Bacteriuria: Secondary | ICD-10-CM | POA: Diagnosis not present

## 2024-03-19 DIAGNOSIS — N3946 Mixed incontinence: Secondary | ICD-10-CM | POA: Diagnosis not present

## 2024-03-19 DIAGNOSIS — N8111 Cystocele, midline: Secondary | ICD-10-CM | POA: Insufficient documentation

## 2024-03-19 DIAGNOSIS — I2510 Atherosclerotic heart disease of native coronary artery without angina pectoris: Secondary | ICD-10-CM | POA: Diagnosis not present

## 2024-03-19 DIAGNOSIS — Z01818 Encounter for other preprocedural examination: Secondary | ICD-10-CM

## 2024-03-19 DIAGNOSIS — G4730 Sleep apnea, unspecified: Secondary | ICD-10-CM | POA: Diagnosis not present

## 2024-03-19 HISTORY — DX: Prediabetes: R73.03

## 2024-03-19 LAB — CBC
HCT: 42.5 % (ref 36.0–46.0)
Hemoglobin: 13.3 g/dL (ref 12.0–15.0)
MCH: 27.4 pg (ref 26.0–34.0)
MCHC: 31.3 g/dL (ref 30.0–36.0)
MCV: 87.6 fL (ref 80.0–100.0)
Platelets: 320 10*3/uL (ref 150–400)
RBC: 4.85 MIL/uL (ref 3.87–5.11)
RDW: 14.6 % (ref 11.5–15.5)
WBC: 9 10*3/uL (ref 4.0–10.5)
nRBC: 0 % (ref 0.0–0.2)

## 2024-03-19 LAB — COMPREHENSIVE METABOLIC PANEL WITH GFR
ALT: 21 U/L (ref 0–44)
AST: 26 U/L (ref 15–41)
Albumin: 3.4 g/dL — ABNORMAL LOW (ref 3.5–5.0)
Alkaline Phosphatase: 66 U/L (ref 38–126)
Anion gap: 6 (ref 5–15)
BUN: 10 mg/dL (ref 8–23)
CO2: 29 mmol/L (ref 22–32)
Calcium: 8.7 mg/dL — ABNORMAL LOW (ref 8.9–10.3)
Chloride: 108 mmol/L (ref 98–111)
Creatinine, Ser: 1 mg/dL (ref 0.44–1.00)
GFR, Estimated: >60 mL/min (ref >60)
Glucose, Bld: 121 mg/dL — ABNORMAL HIGH (ref 70–99)
Potassium: 3.8 mmol/L (ref 3.5–5.1)
Sodium: 143 mmol/L (ref 135–145)
Total Bilirubin: 0.9 mg/dL (ref 0.0–1.2)
Total Protein: 6.3 g/dL — ABNORMAL LOW (ref 6.5–8.1)

## 2024-03-20 ENCOUNTER — Encounter (HOSPITAL_COMMUNITY): Payer: Self-pay | Admitting: Physician Assistant

## 2024-03-20 LAB — URINE CULTURE: Culture: <10000 — AB

## 2024-03-20 NOTE — Progress Notes (Incomplete Revision)
 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 never smoker with PMH of HTN, HLD, non obstructive CAD, OSA, hiatal hernia s/p repair (2022), esophageal stricture, GERD, anxiety, depression.  Patient follows with Cardiology for nonobstructive CAD. 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 Case was discussed with CT surgery, and given anterior course of LAD, it was thought it was likely not to be etiology of symptoms. She had a normal stress test in 2019 and 2021. She did notably have a large hiatal hernia and this was repaired in 2022. She had another coronary CTA in 04/2023 which re demonstrated nonobstructive CAD. Last seen by Dr. Micael Adas on 02/20/24. Has chronic DOE which is unchanged. Also reported dizziness/lightheadedness. Her Lisinopril  was discontinued and Zio patch ordered which showed PSVT/Atrial tachycardia and an episode of V tach.  Per Dr. Micael Adas regarding results: "2D echo showed low normal LV function a year ago.  Please repeat 2D echo to reassess LV function.  Please have her come in for magnesium  and TSH level"  VS: BP (!) 153/75   Pulse (!) 55   Temp 36.7 C (Oral)   Resp 16   Ht 5\' 3"  (1.6 m)   Wt 70 kg   SpO2 98%   BMI 27.34 kg/m   PROVIDERS: Victorio Grave, MD is PCP    LABS: Labs reviewed: Acceptable for surgery. (all labs ordered are listed, but only abnormal  results are displayed)  Labs Reviewed  COMPREHENSIVE METABOLIC PANEL WITH GFR - Abnormal; Notable for the following components:      Result Value   Glucose, Bld 121 (*)    Calcium  8.7 (*)    Total Protein 6.3 (*)    Albumin 3.4 (*)    All other components within normal limits  URINE CULTURE  CBC  TYPE AND SCREEN     IMAGES:   EKG:   CV:  Holter monitor 03/25/24:    Predominant rhythm was normal sinus rhythm with an average heart rate of 63 bpm and ranged from 41 to 125 bpm   2 episodes of SVT consistent with atrial tachycardia with the longest run lasting 6 beats and the fastest run at 146 bpm   1 episode of wide-complex tachycardia lasting 6 beats consistent with nonsustained ventricular tachycardia   Rare PACs, atrial couplets   Rare PVCs and ventricular couplets as well as ventricular bigeminy  Echo 12/11/2022 1. Left ventricular ejection fraction, by estimation, is 50 to 55%. The  left ventricle has low normal function. Left ventricular endocardial  border not optimally defined to evaluate regional wall motion. Left  ventricular diastolic parameters are  consistent with Grade I diastolic dysfunction (impaired relaxation).   2. Right ventricular systolic function was not well visualized. The right  ventricular size is normal. Tricuspid regurgitation signal is  inadequate  for assessing PA pressure.   3. The mitral valve is degenerative. No evidence of mitral valve  regurgitation. No evidence of mitral stenosis.   4. The aortic valve is tricuspid. There is mild calcification of the  aortic valve. Aortic valve regurgitation is not visualized. No aortic  stenosis is present.   Comparison(s): No significant change from prior study. Prior images  reviewed side by side.   Myocardial Perfusion 09/16/2020 <82mm ST depression at rest, mild worsening with stress Nuclear stress EF: 55%. The left ventricular ejection fraction is normal (55-65%). The study is normal. This is a  low risk study.  Past Medical History:  Diagnosis Date   Acid reflux    ALLERGIC RHINITIS    Anxiety    CAD (coronary artery disease), native coronary artery    coronary CTA 11/2019 with a calcium  score of 146 and mild nonobstructive plaque in the mid LAD.   Depression    Diverticulosis    Dyspnea    with exertion    Esophageal stricture    Hiatal hernia    with  Schatzki's Ring   History of kidney stones    Hyperlipidemia    Hypertension    IBS (irritable bowel syndrome)    Orthostatic hypotension    Pre-diabetes    Sleep apnea    no CPAP    Past Surgical History:  Procedure Laterality Date   ABDOMINAL HYSTERECTOMY     ANKLE FRACTURE SURGERY  2012   right   COLONOSCOPY  multiple   ESOPHAGOGASTRODUODENOSCOPY  09/14/05   multiple   KIDNEY STONE SURGERY     TONSILLECTOMY     TUBAL LIGATION     VESICOVAGINAL FISTULA CLOSURE W/ TAH     XI ROBOTIC ASSISTED HIATAL HERNIA REPAIR N/A 01/13/2021   Procedure: ROBOTIC ASSISTED NISSEN FUNDOPLICATION;  Surgeon: Junie Olds, MD;  Location: [facility];  Service: General;  Laterality: N/A;    MEDICATIONS:  aspirin  EC 81 MG tablet   atorvastatin  (LIPITOR ) 80 MG tablet   cetirizine (ZYRTEC) 10 MG tablet   CYMBALTA  60 MG capsule   diphenhydrAMINE (BENADRYL) 25 MG tablet   ezetimibe  (ZETIA ) 10 MG tablet   fluticasone  (FLONASE ) 50 MCG/ACT nasal spray   ibuprofen  (ADVIL ) 200 MG tablet   isosorbide  mononitrate (IMDUR ) 60 MG 24 hr tablet   loperamide (IMODIUM A-D) 2 MG tablet   metoprolol  succinate (TOPROL  XL) 25 MG 24 hr tablet   mirabegron ER (MYRBETRIQ) 50 MG TB24 tablet   Multiple Vitamins-Minerals (ADULT GUMMY PO)   pantoprazole  (PROTONIX ) 40 MG tablet   traZODone (DESYREL) 50 MG tablet   triamcinolone  ointment (KENALOG ) 0.1 %   Vitamin D -Vitamin K (VITAMIN K2-VITAMIN D3 PO)   No current facility-administered medications for this encounter.      Chick Cotton Ward, PA-C [facility]-Surgical Testing [phone]

## 2024-03-20 NOTE — Progress Notes (Addendum)
 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 never smoker with PMH of HTN, HLD, non obstructive CAD, OSA, hiatal hernia s/p repair (2022), esophageal stricture, GERD, anxiety, depression.  Patient follows with Cardiology for nonobstructive CAD. 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 Case was discussed with CT surgery, and given anterior course of LAD, it was thought it was likely not to be etiology of symptoms. She had a normal stress test in 2019 and 2021. She did notably have a large hiatal hernia and this was repaired in 2022. She had another coronary CTA in 04/2023 which re demonstrated nonobstructive CAD. Last seen by Dr. Micael Adas on 02/20/24. Has chronic DOE which is unchanged. Also reported dizziness/lightheadedness. Her Lisinopril  was discontinued and Zio patch ordered which showed PSVT/Atrial tachycardia and an episode of V tach.  Per Dr. Micael Adas regarding results: "2D echo showed low normal LV function a year ago.  Please repeat 2D echo to reassess LV function.  Please have her come in for magnesium  and TSH level"  Discussed with Dr Noreene Bearded. Pt will require cardiac clearance and echo to be done prior to surgery  VS: BP (!) 153/75   Pulse (!) 55   Temp 36.7 C (Oral)   Resp 16   Ht 5\' 3"  (1.6 m)   Wt 70 kg   SpO2 98%   BMI 27.34 kg/m   PROVIDERS: Victorio Grave, MD  is PCP    LABS: Labs reviewed: Acceptable for surgery. (all labs ordered are listed, but only abnormal results are displayed)  Labs Reviewed  COMPREHENSIVE METABOLIC PANEL WITH GFR - Abnormal; Notable for the following components:      Result Value   Glucose, Bld 121 (*)    Calcium  8.7 (*)    Total Protein 6.3 (*)    Albumin 3.4 (*)    All other components within normal limits  URINE CULTURE  CBC  TYPE AND SCREEN     IMAGES:   EKG:   CV:  Holter monitor 03/25/24:    Predominant rhythm was normal sinus rhythm with an average heart rate of 63 bpm and ranged from 41 to 125 bpm   2 episodes of SVT consistent with atrial tachycardia with the longest run lasting 6 beats and the fastest run at 146 bpm   1 episode of wide-complex tachycardia lasting 6 beats consistent with nonsustained ventricular tachycardia   Rare PACs, atrial couplets   Rare PVCs and ventricular couplets as well as ventricular bigeminy  Echo 12/11/2022 1. Left ventricular ejection fraction, by estimation, is 50 to 55%. The  left ventricle has low normal function. Left ventricular endocardial  border not optimally defined to evaluate regional wall motion. Left  ventricular diastolic parameters are  consistent with Grade I diastolic dysfunction (impaired relaxation).   2.  Right ventricular systolic function was not well visualized. The right  ventricular size is normal. Tricuspid regurgitation signal is inadequate  for assessing PA pressure.   3. The mitral valve is degenerative. No evidence of mitral valve  regurgitation. No evidence of mitral stenosis.   4. The aortic valve is tricuspid. There is mild calcification of the  aortic valve. Aortic valve regurgitation is not visualized. No aortic  stenosis is present.   Comparison(s): No significant change from prior study. Prior images  reviewed side by side.   Myocardial Perfusion 09/16/2020 <60mm ST depression at rest, mild worsening with stress Nuclear  stress EF: 55%. The left ventricular ejection fraction is normal (55-65%). The study is normal. This is a low risk study.  Past Medical History:  Diagnosis Date   Acid reflux    ALLERGIC RHINITIS    Anxiety    CAD (coronary artery disease), native coronary artery    coronary CTA 11/2019 with a calcium  score of 146 and mild nonobstructive plaque in the mid LAD.   Depression    Diverticulosis    Dyspnea    with exertion    Esophageal stricture    Hiatal hernia    with  Schatzki's Ring   History of kidney stones    Hyperlipidemia    Hypertension    IBS (irritable bowel syndrome)    Orthostatic hypotension    Pre-diabetes    Sleep apnea    no CPAP    Past Surgical History:  Procedure Laterality Date   ABDOMINAL HYSTERECTOMY     ANKLE FRACTURE SURGERY  2012   right   COLONOSCOPY  multiple   ESOPHAGOGASTRODUODENOSCOPY  09/14/05   multiple   KIDNEY STONE SURGERY     TONSILLECTOMY     TUBAL LIGATION     VESICOVAGINAL FISTULA CLOSURE W/ TAH     XI ROBOTIC ASSISTED HIATAL HERNIA REPAIR N/A 01/13/2021   Procedure: ROBOTIC ASSISTED NISSEN FUNDOPLICATION;  Surgeon: Junie Olds, MD;  Location: [facility];  Service: General;  Laterality: N/A;    MEDICATIONS:  aspirin  EC 81 MG tablet   atorvastatin  (LIPITOR ) 80 MG tablet   cetirizine (ZYRTEC) 10 MG tablet   CYMBALTA  60 MG capsule   diphenhydrAMINE (BENADRYL) 25 MG tablet   ezetimibe  (ZETIA ) 10 MG tablet   fluticasone  (FLONASE ) 50 MCG/ACT nasal spray   ibuprofen  (ADVIL ) 200 MG tablet   isosorbide  mononitrate (IMDUR ) 60 MG 24 hr tablet   loperamide (IMODIUM A-D) 2 MG tablet   metoprolol  succinate (TOPROL  XL) 25 MG 24 hr tablet   mirabegron ER (MYRBETRIQ) 50 MG TB24 tablet   Multiple Vitamins-Minerals (ADULT GUMMY PO)   pantoprazole  (PROTONIX ) 40 MG tablet   traZODone (DESYREL) 50 MG tablet   triamcinolone  ointment (KENALOG ) 0.1 %   Vitamin D -Vitamin K (VITAMIN K2-VITAMIN D3 PO)   No current facility-administered  medications for this encounter.   Antoinette Kirschner [facility] Short Stay/Anesthesiology [facility] Phone [phone] 03/26/2024 11:35 AM

## 2024-03-22 ENCOUNTER — Ambulatory Visit: Admitting: Cardiology

## 2024-03-24 DIAGNOSIS — R42 Dizziness and giddiness: Secondary | ICD-10-CM | POA: Diagnosis not present

## 2024-03-24 DIAGNOSIS — R002 Palpitations: Secondary | ICD-10-CM | POA: Diagnosis not present

## 2024-03-25 ENCOUNTER — Telehealth: Payer: Self-pay | Admitting: Cardiology

## 2024-03-25 DIAGNOSIS — R002 Palpitations: Secondary | ICD-10-CM | POA: Diagnosis not present

## 2024-03-25 DIAGNOSIS — R42 Dizziness and giddiness: Secondary | ICD-10-CM

## 2024-03-25 NOTE — Telephone Encounter (Signed)
   Name: Debbie [name]  DOB: 07-12-1951  MRN: [id number]  Primary Cardiologist: Gaylyn Keas, MD  Chart reviewed as part of pre-operative protocol coverage. The patient has an upcoming visit scheduled with Leala Prince, PA on 04/10/2024 at which time clearance can be addressed in case there are any issues that would impact surgical recommendations. Pt is also pending echocardiogram.   I added preop FYI to appointment note so that provider is aware to address at time of outpatient visit.  Per office protocol the cardiology provider should forward their finalized clearance decision and recommendations regarding antiplatelet therapy to the requesting party below.    I will route this message as FYI to requesting party and remove this message from the preop box as separate preop APP input not needed at this time.   Please call with any questions.  Jude Norton, NP  03/25/2024, 3:33 PM

## 2024-03-25 NOTE — Telephone Encounter (Signed)
   Pre-operative Risk Assessment    Patient Name: Debbie [name]  DOB: 1951-02-09 MRN: [id number]   Date of last office visit: 02/20/24 Date of next office visit: 04/10/24   Request for Surgical Clearance    Procedure:   Sacrocolpotexy/ Mid-urethral/ Cystoscopy with possible biopsy/ fulguration  Date of Surgery:  Clearance 03/27/24                                Surgeon:  Dr. Rolene Client  Surgeon's Group or Practice Name:  [facility]  Phone number:  [phone] Ext: [phone] Fax number:  [phone]   Type of Clearance Requested:   - Medical    Type of Anesthesia:  General    Additional requests/questions:   Requesting for Pt to have Echo  SignedPrudence Brown   03/25/2024, 2:07 PM

## 2024-03-26 ENCOUNTER — Telehealth: Payer: Self-pay | Admitting: Cardiology

## 2024-03-26 NOTE — Telephone Encounter (Signed)
 Office calling to see if patient can be cleared for tomorrow, because if not the surgery will have to be pushed back to August. Please advise

## 2024-03-26 NOTE — Telephone Encounter (Signed)
 Error

## 2024-03-26 NOTE — Telephone Encounter (Signed)
 Spoke with Adell Hones from [facility] regarding clearance for surgery. Pt is scheduled for surgery tomorrow however her appointment for clearance to be addressed is not until 04/10/24. Adell Hones stated she would call the pt to let them know that surgery will need to be rescheduled. Adell Hones verbalized understanding. All questions if any were answered.

## 2024-03-27 ENCOUNTER — Telehealth: Payer: Self-pay

## 2024-03-27 ENCOUNTER — Encounter (HOSPITAL_COMMUNITY): Admission: RE | Payer: Self-pay | Source: Home / Self Care

## 2024-03-27 ENCOUNTER — Ambulatory Visit (HOSPITAL_COMMUNITY): Admission: RE | Admit: 2024-03-27 | Source: Home / Self Care | Admitting: Urology

## 2024-03-27 DIAGNOSIS — R002 Palpitations: Secondary | ICD-10-CM

## 2024-03-27 DIAGNOSIS — I519 Heart disease, unspecified: Secondary | ICD-10-CM

## 2024-03-27 LAB — TYPE AND SCREEN
ABO/RH(D): A POS
Antibody Screen: NEGATIVE

## 2024-03-27 SURGERY — SACROCOLPOPEXY, ROBOT-ASSISTED, LAPAROSCOPIC
Anesthesia: General

## 2024-03-27 NOTE — Telephone Encounter (Signed)
-----   Message from Gaylyn Keas sent at 03/25/2024  9:29 AM EDT ----- Heart monitor showed mainly normal sinus rhythm.  There were 2 episodes of extra heartbeats coming from the top of the heart lasting 6 beats called SVT/atrial tachycardia.  There was also 1 episode of 6 beats of a heart rhythm coming from the bottom of the heart called nonsustained ventricular tachycardia.  Recent labs showed a potassium 3.8.  2D echo showed low normal LV function a year ago.  Please repeat 2D echo to reassess LV function.  Please have her come in for magnesium  and TSH level

## 2024-03-27 NOTE — Telephone Encounter (Signed)
 I left a message for the patient to call our office back.

## 2024-03-27 NOTE — Telephone Encounter (Signed)
 Pt's daughter Sydney Eve is requesting a callback at [phone] regarding pt's procedure being canceled due to monitor results but they hadn't heard anything back. They'd like to discuss further with nurse. Please advise.

## 2024-03-27 NOTE — Telephone Encounter (Signed)
 Pt returning call to a nurse

## 2024-03-27 NOTE — Telephone Encounter (Signed)
 Spoke with pt regarding her results. Pt aware of echo. Echo ordered. Pt aware of labs to be drawn. Labs ordered and released. Pt verbalized understanding. All questions if any were answered.

## 2024-03-28 NOTE — Telephone Encounter (Signed)
 Returned call, left another message for pt to call back.

## 2024-04-02 DIAGNOSIS — I5040 Unspecified combined systolic (congestive) and diastolic (congestive) heart failure: Secondary | ICD-10-CM | POA: Diagnosis not present

## 2024-04-02 DIAGNOSIS — I700 Atherosclerosis of aorta: Secondary | ICD-10-CM | POA: Diagnosis not present

## 2024-04-02 DIAGNOSIS — D090 Carcinoma in situ of bladder: Secondary | ICD-10-CM | POA: Diagnosis not present

## 2024-04-02 DIAGNOSIS — E559 Vitamin D deficiency, unspecified: Secondary | ICD-10-CM | POA: Diagnosis not present

## 2024-04-02 DIAGNOSIS — R7303 Prediabetes: Secondary | ICD-10-CM | POA: Diagnosis not present

## 2024-04-02 DIAGNOSIS — F419 Anxiety disorder, unspecified: Secondary | ICD-10-CM | POA: Diagnosis not present

## 2024-04-02 DIAGNOSIS — E785 Hyperlipidemia, unspecified: Secondary | ICD-10-CM | POA: Diagnosis not present

## 2024-04-02 DIAGNOSIS — R42 Dizziness and giddiness: Secondary | ICD-10-CM | POA: Diagnosis not present

## 2024-04-02 DIAGNOSIS — I10 Essential (primary) hypertension: Secondary | ICD-10-CM | POA: Diagnosis not present

## 2024-04-02 DIAGNOSIS — F324 Major depressive disorder, single episode, in partial remission: Secondary | ICD-10-CM | POA: Diagnosis not present

## 2024-04-05 DIAGNOSIS — R002 Palpitations: Secondary | ICD-10-CM | POA: Diagnosis not present

## 2024-04-06 LAB — MAGNESIUM: Magnesium: 1.9 mg/dL (ref 1.6–2.3)

## 2024-04-06 LAB — TSH: TSH: 2.69 u[IU]/mL (ref 0.450–4.500)

## 2024-04-08 ENCOUNTER — Ambulatory Visit: Attending: Family Medicine | Admitting: Physical Therapy

## 2024-04-08 DIAGNOSIS — R2681 Unsteadiness on feet: Secondary | ICD-10-CM | POA: Insufficient documentation

## 2024-04-08 DIAGNOSIS — R42 Dizziness and giddiness: Secondary | ICD-10-CM | POA: Insufficient documentation

## 2024-04-10 ENCOUNTER — Ambulatory Visit: Attending: Cardiology | Admitting: Cardiology

## 2024-04-10 ENCOUNTER — Encounter: Payer: Self-pay | Admitting: Cardiology

## 2024-04-10 VITALS — BP 136/88 | HR 79 | Ht 63.0 in | Wt 157.2 lb

## 2024-04-10 DIAGNOSIS — I2510 Atherosclerotic heart disease of native coronary artery without angina pectoris: Secondary | ICD-10-CM | POA: Diagnosis not present

## 2024-04-10 DIAGNOSIS — R002 Palpitations: Secondary | ICD-10-CM

## 2024-04-10 DIAGNOSIS — R0602 Shortness of breath: Secondary | ICD-10-CM | POA: Diagnosis not present

## 2024-04-10 DIAGNOSIS — I10 Essential (primary) hypertension: Secondary | ICD-10-CM

## 2024-04-10 DIAGNOSIS — E7800 Pure hypercholesterolemia, unspecified: Secondary | ICD-10-CM | POA: Diagnosis not present

## 2024-04-10 MED ORDER — METOPROLOL SUCCINATE ER 25 MG PO TB24: 37.5000 mg | ORAL_TABLET | Freq: Every day | ORAL | 1 refills | Status: DC

## 2024-04-10 NOTE — Patient Instructions (Signed)
 Medication Instructions:   INCREASE YOUR METOPROLOL  SUCCINATE (TOPROL  XL) TO 37.5 MG BY MOUTH DAILY-  *If you need a refill on your cardiac medications before your next appointment, please call your pharmacy*    Follow-Up:  3 MONTHS WITH AN EXTENDER IN THE OFFICE

## 2024-04-10 NOTE — Progress Notes (Signed)
 Cardiology Office Note:   Date:  04/10/2024  ID:  Debbie [name], DOB [birth year], MRN [id number] PCP: Victorio Grave, MD  [facility] Providers Cardiologist:  Gaylyn Keas, MD    History of Present Illness:   Discussed the use of AI scribe software for clinical note transcription with the patient, who gave verbal consent to proceed. History of Present Illness Debbie [name] "Debbie [name]" is a 73 year old female with hypertension, hyperlipidemia, chronic shortness of breath, and atherosclerotic coronary artery disease who presents for follow-up of palpitations and dizziness.  She experiences palpitations almost daily, particularly during exertion, accompanied by facial redness and heavy breathing. A Zio monitor revealed brief PAT, rare PVCs and PACs. She is on metoprolol  succinate 25 mg daily, which has not fully managed her symptoms.  She experiences dizziness described as a 'room spinning' sensation, exacerbated by head movements. This was initially thought to be related to lisinopril , which was discontinued, but the dizziness persisted. Her primary care physician suspects vertigo related to her left ear. She has not yet attended vestibular physical therapy.  She has chronic dyspnea on exertion, experiencing shortness of breath during activities such as walking, climbing stairs, and doing housework. She walks daily with her sister, covering about a quarter of a mile, and can climb a flight of stairs, though it is challenging, especially when carrying groceries. She can perform light housework but needs to rest frequently. No chest pain with physical activity, but occasional chest discomfort when lying on her left side at night.  Her blood pressure at home is typically 130-140/70 mmHg, checked once or twice daily with an automatic cuff. She was previously on lisinopril  5 mg, which was stopped due to dizziness. Her blood pressure tends to rise during doctor visits.   Patient is  pending sacrocolpotexy/ Mid-urethral/ Cystoscopy with possible biopsy/ fulguration on 07/18/24.   Studies Reviewed:    EKG:        04-10-2024 Zio    Predominant rhythm was normal sinus rhythm with an average heart rate of 63 bpm and ranged from 41 to 125 bpm   2 episodes of SVT consistent with atrial tachycardia with the longest run lasting 6 beats and the fastest run at 146 bpm   1 episode of wide-complex tachycardia lasting 6 beats consistent with nonsustained ventricular tachycardia   Rare PACs, atrial couplets   Rare PVCs and ventricular couplets as well as ventricular bigeminy   05/03/23 Cardiac CTA  Aorta: Normal size. Mild aortic root calcifications. No dissection.   Aortic Valve:  Trileaflet.  No calcifications.   Coronary Arteries:  Normal coronary origin.  Right dominance.   RCA is a large dominant artery that gives rise to PDA and PLA. There is minimal (<24%) focal calcification in the proximal RCA. The distal RCA with minimal calcification. Moderate (50-69%) focal calcification in the proximal PDA.   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   LAD is a large vessel. Anomalous origin of the left main from the right coronary cusp crosses anterior to the pulmonary artery and the LAD dives into the anterior interventricular sulcus. There is a moderate focal calcification in the proximal LAD. The mid and distal LAD with minimal calcification.   LCX is a non-dominant artery that gives rise to one large OM1 branch. There are minimal focal calcifications in the mid LCX. OM1 is a  small artery.   Coronary Calcium  Score:   Left main: 0   Left anterior descending artery: 64.7   Left circumflex artery: 10   Right coronary artery: 247   Total: 322   Percentile: 89   Other findings:   Normal pulmonary vein drainage into the left atrium.    Normal left atrial appendage without a thrombus.   Normal size of the pulmonary artery.  1. Left Main: FFR = 0.99   2. LAD: Proximal FFR = 0.96, mid FFR = 0.96, distal FFR = 0.96 3. LCX: Not analyzed 4. RCA: Proximal FFR = 0.98, mid FFR =0.98, distal FFR = 0.98   IMPRESSION: 1.  CT FFR analysis showed no significant stenosis.   RECOMMENDATIONS: Guideline-directed medical therapy and aggressive risk factor modification for secondary prevention of coronary artery disease.  Risk Assessment/Calculations:              Physical Exam:   VS:  BP 136/88   Pulse 79   Ht 5\' 3"  (1.6 m)   Wt 157 lb 3.2 oz (71.3 kg)   SpO2 96%   BMI 27.85 kg/m    Wt Readings from Last 3 Encounters:  04/10/24 157 lb 3.2 oz (71.3 kg)  03/19/24 154 lb 5.2 oz (70 kg)  02/27/24 155 lb 6.4 oz (70.5 kg)     Physical Exam Vitals reviewed.  Constitutional:      Appearance: Normal appearance.  HENT:     Head: Normocephalic.     Nose: Nose normal.  Eyes:     Pupils: Pupils are equal, round, and reactive to light.  Cardiovascular:     Rate and Rhythm: Normal rate and regular rhythm.     Pulses: Normal pulses.     Heart sounds: Normal heart sounds. No murmur heard.    No friction rub. No gallop.  Pulmonary:     Effort: Pulmonary effort is normal.     Breath sounds: Normal breath sounds.  Abdominal:     General: Abdomen is flat.  Musculoskeletal:     Right lower leg: No edema.     Left lower leg: No edema.  Skin:    General: Skin is warm and dry.     Capillary Refill: Capillary refill takes less than 2 seconds.  Neurological:     General: No focal deficit present.     Mental Status: She is alert and oriented to person, place, and time.  Psychiatric:        Mood and Affect: Mood normal.        Behavior: Behavior normal.        Thought Content: Thought content normal.        Judgment: Judgment normal.     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 No new symptoms suggestive of worsening condition. Low risk of cardiac events during upcoming surgery with a revised cardiac index indicating less than 1% risk, METS>4.0. No need for further stress testing unless new symptoms develop. - Continue aspirin  therapy. - Continue atorvastatin  for cholesterol management. - Overall low suspicion for cardiac chest discomfort but given stable symptoms, reasonable to continue Imdur  60mg .  Palpitations Frequent palpitations associated with exertion. Previous Zio monitor showed PAT, PVCs, PACs. Echocardiogram pending. Patient feels that current metoprolol  dose may not be sufficient to manage symptoms.  -  Increase metoprolol  succinate from 25 mg to 37.5 mg daily. If patient notices improvement, could further increase to 50mg .  Chronic dyspnea on exertion Patient continues to have chronic dyspnea on exertion with no significant change in exertional tolerance. No evidence of cardiac cause for dyspnea based on previous echocardiogram and coronary CTA. Suspect that her palpitations may also manifest with brief dyspnea. - Increase Metoprolol  Succinate to 37.5mg .   Hypertension Blood pressure well-controlled at home with readings typically between 130-140/70. No significant change in dizziness after discontinuation of lisinopril . Blood pressure at visit was 136/88. - Continue Imdur  60mg  daily - Increase Metoprolol  Succinate to 37.5mg   Hyperlipidemia Cholesterol levels well-controlled with LDL at 45.  - Continue atorvastatin  80mg  daily.  Vertigo Symptoms consistent with vertigo, likely vestibular in origin. Dizziness described as room spinning, worsened with head movement. Referred to vestibular physical therapy. - Attend vestibular physical therapy sessions.          Signed, Leala Prince, PA-C

## 2024-04-17 ENCOUNTER — Ambulatory Visit (HOSPITAL_COMMUNITY): Attending: Cardiology

## 2024-04-17 ENCOUNTER — Ambulatory Visit: Payer: Self-pay | Admitting: Cardiology

## 2024-04-17 DIAGNOSIS — I119 Hypertensive heart disease without heart failure: Secondary | ICD-10-CM | POA: Insufficient documentation

## 2024-04-17 DIAGNOSIS — R002 Palpitations: Secondary | ICD-10-CM | POA: Insufficient documentation

## 2024-04-17 DIAGNOSIS — I519 Heart disease, unspecified: Secondary | ICD-10-CM

## 2024-04-17 DIAGNOSIS — I2510 Atherosclerotic heart disease of native coronary artery without angina pectoris: Secondary | ICD-10-CM | POA: Diagnosis not present

## 2024-04-17 DIAGNOSIS — E785 Hyperlipidemia, unspecified: Secondary | ICD-10-CM | POA: Diagnosis not present

## 2024-04-17 DIAGNOSIS — I10 Essential (primary) hypertension: Secondary | ICD-10-CM | POA: Diagnosis not present

## 2024-04-17 DIAGNOSIS — R0602 Shortness of breath: Secondary | ICD-10-CM | POA: Insufficient documentation

## 2024-04-17 LAB — ECHOCARDIOGRAM COMPLETE
Area-P 1/2: 3.54 cm2
S' Lateral: 3.5 cm

## 2024-04-24 ENCOUNTER — Ambulatory Visit: Admitting: Physical Therapy

## 2024-04-24 ENCOUNTER — Encounter: Payer: Self-pay | Admitting: Physical Therapy

## 2024-04-24 VITALS — BP 135/76 | HR 74

## 2024-04-24 DIAGNOSIS — R2681 Unsteadiness on feet: Secondary | ICD-10-CM | POA: Diagnosis not present

## 2024-04-24 DIAGNOSIS — R42 Dizziness and giddiness: Secondary | ICD-10-CM | POA: Diagnosis not present

## 2024-04-24 NOTE — Therapy (Signed)
 OUTPATIENT PHYSICAL THERAPY VESTIBULAR EVALUATION     Patient Name: Debbie [name] MRN: [id number] DOB:30-Jul-1951, 73 y.o., female Today's Date: 04/24/2024  END OF SESSION:  PT End of Session - 04/24/24 1229     Visit Number 1    Number of Visits 5    Date for PT Re-Evaluation 06/23/24    Authorization Type [employer] MEDICARE    PT Start Time 1227    PT Stop Time 1308    PT Time Calculation (min) 41 min    Activity Tolerance Patient tolerated treatment well    Behavior During Therapy WFL for tasks assessed/performed             Past Medical History:  Diagnosis Date   Acid reflux    ALLERGIC RHINITIS    Anxiety    CAD (coronary artery disease), native coronary artery    coronary CTA 11/2019 with a calcium  score of 146 and mild nonobstructive plaque in the mid LAD.   Depression    Diverticulosis    Dyspnea    with exertion    Esophageal stricture    Hiatal hernia    with  Schatzki's Ring   History of kidney stones    Hyperlipidemia    Hypertension    IBS (irritable bowel syndrome)    Orthostatic hypotension    Pre-diabetes    Sleep apnea    no CPAP   Past Surgical History:  Procedure Laterality Date   ABDOMINAL HYSTERECTOMY     ANKLE FRACTURE SURGERY  2012   right   COLONOSCOPY  multiple   ESOPHAGOGASTRODUODENOSCOPY  09/14/05   multiple   KIDNEY STONE SURGERY     TONSILLECTOMY     TUBAL LIGATION     VESICOVAGINAL FISTULA CLOSURE W/ TAH     XI ROBOTIC ASSISTED HIATAL HERNIA REPAIR N/A 01/13/2021   Procedure: ROBOTIC ASSISTED NISSEN FUNDOPLICATION;  Surgeon: Junie Olds, MD;  Location: [facility];  Service: General;  Laterality: N/A;   Patient Active Problem List   Diagnosis Date Noted   Syncope 12/11/2022   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   CAD (coronary artery disease), native coronary artery    Chest pain 02/09/2018   SOB (shortness of breath) 02/09/2018   Anemia 04/23/2017   Hypokalemia 04/23/2017   Orthostatic hypotension 04/23/2017    Diverticulitis large intestine w/o perforation or abscess w/bleeding 04/22/2017   Hypertension 08/30/2012   Hypercholesteremia 08/30/2012   Seasonal and perennial allergic rhinitis 08/18/2012   Acute recurrent maxillary sinusitis 08/18/2012   Asthma with bronchitis 08/18/2012    PCP: Victorio Grave, MD REFERRING PROVIDER: Victorio Grave, MD  REFERRING DIAG: [phone] (ICD-10-CM) - Benign paroxysmal vertigo, left ear  THERAPY DIAG:  Unsteadiness on feet  Dizziness and giddiness  ONSET DATE: 04/02/2024  Rationale for Evaluation and Treatment: Rehabilitation  SUBJECTIVE:   SUBJECTIVE STATEMENT: Pt reports dizziness has been going on for about a year, they originally thought it was blood pressure. Saw her PCP after telling her her symptoms who believed that it was vertigo. Can be walking along and then feel dizzy and lightheaded, does not pass out, but it feels like she might. Seeing the cardiologist for further work-up and was wearing a heart monitor with 2 weeks, will follow up with the cardiologist in a couple weeks. Also having some room spinning, will just come on all of sudden. Reports it will happen when she is laying down, more so when she is on the L side. Feels like she has to hold onto things  for balance, does not use a cane. When getting up, can't get up too fast. Pt was instructed to wear compression hose from her cardiologist, wears them half and half   Pt accompanied by: self  PERTINENT HISTORY: PMH: Anxiety, CAD, Depression, HLD, HTN, orthostation hypotension, hx of bladder cancer, chronic shortness of breath   She experiences dizziness described as a 'room spinning' sensation, exacerbated by head movements. This was initially thought to be related to lisinopril , which was discontinued, but the dizziness persisted. Her primary care physician suspects vertigo related to her left ear. She has not yet attended vestibular physical therapy.   PAIN:  Are you having pain?  No  PRECAUTIONS: Fall  FALLS: Has patient fallen in last 6 months? No  LIVING ENVIRONMENT: Lives with: lives with their spouse and and grandson Lives in: House/apartment Stairs: Yes: External: 8 on front porch, 12 on back porch steps; can reach both Has following equipment at home: Environmental consultant - 2 wheeled and Grab bars  PLOF: Independent  PATIENT GOALS: Wants to be able to walk without worrying about getting dizzy   OBJECTIVE:  Note: Objective measures were completed at Evaluation unless otherwise noted.   COGNITION: Overall cognitive status: Within functional limits for tasks assessed   POSTURE:  rounded shoulders, forward head, and increased thoracic kyphosis  Cervical ROM:   WFL  Modified VBI:  R: Negative L: pt reporting head felt a little light as if she were "zombied out", reporting the feeling got better the longer she held it   GAIT: Gait pattern: decreased stride length and decreased trunk rotation, unsteadiness with gait  Distance walked: Clinic distances  Assistive device utilized: None Level of assistance: SBA and CGA Comments: Pt needing to hold onto walls and counters at time with no AD for balance    VESTIBULAR ASSESSMENT:  GENERAL OBSERVATION: Ambulates in independently with no AD   SYMPTOM BEHAVIOR:  Subjective history: See above   Non-Vestibular symptoms: headaches and thinks headaches are due to the pollen   Type of dizziness: Imbalance (Disequilibrium), Spinning/Vertigo, and Lightheadedness/Faint  Frequency: Every second day or so where she gets lightheaded or dizzy   Duration: Few seconds   Aggravating factors: Can't really tell - sometimes will be walking and then will feel lightheaded.   Relieving factors: lying supine and sitting down or holding onto a counter  Progression of symptoms: unchanged  OCULOMOTOR EXAM:  Ocular Alignment: normal  Ocular ROM: No Limitations  Spontaneous Nystagmus: absent  Gaze-Induced Nystagmus: absent  Smooth  Pursuits: intact  Saccades: intact  VESTIBULAR - OCULAR REFLEX:   Slow VOR: Normal  VOR Cancellation: Normal  Head-Impulse Test: HIT Right: negative HIT Left: positive (very slight), pt reporting feeling a little lightheaded   Dynamic Visual Acuity: Static: Line 5 (even with pt wearing her eye glasses) Dynamic: Line 3 Pt reporting feeling dizzy afterwards    POSITIONAL TESTING: Right Roll Test: no nystagmus Left Roll Test: no nystagmus Right Sidelying: no nystagmus and pt reporting feeling a little lightheaded  Left Sidelying: no nystagmus    OTHOSTATICS:  Vitals:   04/24/24 1238 04/24/24 1239  BP: 136/78 135/76  Pulse: 70 74   Seated, Standing Pt reporting feeling a little lightheaded   BALANCE:    M-CTSIB  Condition 1: Firm Surface, EO 30 Sec, Normal Sway  Condition 2: Firm Surface, EC 30 Sec, Normal Sway  Condition 3: Foam Surface, EO 30 Sec, Normal Sway  Condition 4: Foam Surface, EC 5.9 Sec  TREATMENT DATE: 04/24/24  Self-Care: Educated on findings and pt negative for BPPV, 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, and also appears to have a cardiac component in regards to lightheadedness Educated to wear compression hose at all times (as instructed by cardiologist) to see if that helps with lightheadedness, pt reports wearing them sometimes Educated seeing her eye doctor due to pt only able to read Line 5 on Snellen chart with and without glasses on, pt reports last time pt saw the eye doctor was about a year ago    PATIENT EDUCATION: Education details: Clinical findings, POC, see Self-Care section above  Person educated: Patient Education method: Explanation Education comprehension: verbalized understanding and needs further education  HOME EXERCISE PROGRAM:  GOALS: Goals reviewed with patient? Yes  SHORT TERM GOALS: Target date:  05/22/2024   Pt will be independent with initial HEP for balance/dizziness  Baseline: Goal status: INITIAL  2.  FGA to be assessed with LTG written. Baseline:  Goal status: INITIAL   LONG TERM GOALS: Target date: 06/19/2024  Pt will be independent with final HEP for balance/dizziness  Baseline:  Goal status: INITIAL  2.  FGA goal to be written.  Baseline:  Goal status: INITIAL  3.  Pt will improve condition 4 of mCTSIB to at least 20 seconds in order to demo improved balance with vestibular input.  Baseline: 5.9 Sec Goal status: INITIAL    ASSESSMENT:  CLINICAL IMPRESSION: Patient is a 73 year old female referred to Neuro OPPT for BPPV.   Pt's PMH is significant for: Anxiety, CAD, Depression, HLD, HTN, orthostation hypotension, hx of bladder cancer, chronic shortness of breath . The following deficits were present during the exam: impaired balance, dizziness, gait abnormalities. Pt unsteady and needing to tap to walls and counters at times for balance. Pt negative for BPPV. Pt does report lightheadedness, but no orthostatics noted today. Educated to wear compression socks at all times (based on cardiologist recommendations). Pt only able to read line 5 on Snellen chart even with glasses on, educated pt to schedule an appt with her eye doctor. 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 Pt would benefit from skilled PT to address these impairments and functional limitations to maximize functional mobility independence   OBJECTIVE IMPAIRMENTS: Abnormal gait, decreased activity tolerance, decreased balance, decreased endurance, difficulty walking, dizziness, and postural dysfunction.   ACTIVITY LIMITATIONS: bending, stairs, transfers, and locomotion level  PARTICIPATION LIMITATIONS: community activity  PERSONAL FACTORS: Age, Behavior pattern, Past/current experiences, Time since onset of injury/illness/exacerbation, and 3+  comorbidities: Anxiety, CAD, Depression, HLD, HTN, orthostation hypotension, hx of bladder cancer, chronic shortness of breath  are also affecting patient's functional outcome.   REHAB POTENTIAL: Good  CLINICAL DECISION MAKING: Stable/uncomplicated  EVALUATION COMPLEXITY: Low   PLAN:  PT FREQUENCY: every other week (due to finances)  PT DURATION: 8 weeks  PLANNED INTERVENTIONS: [id number]- PT Re-evaluation, 97110-Therapeutic exercises, 97530- Therapeutic activity, [id number]- Neuromuscular re-education, 97535- Self Care, [id number]- Manual therapy, Patient/Family education, Balance training, and Vestibular training  PLAN FOR NEXT SESSION: perform FGA and write goal, has pt been wearing compression socks? Initiate HEP based on VOR, EC balance and FGA deficits for balance. Try a cane?    Seabron Cypress, PT, DPT 04/24/2024, 1:15 PM

## 2024-04-29 DIAGNOSIS — K5792 Diverticulitis of intestine, part unspecified, without perforation or abscess without bleeding: Secondary | ICD-10-CM | POA: Diagnosis not present

## 2024-04-29 DIAGNOSIS — R300 Dysuria: Secondary | ICD-10-CM | POA: Diagnosis not present

## 2024-05-04 DIAGNOSIS — I10 Essential (primary) hypertension: Secondary | ICD-10-CM | POA: Diagnosis not present

## 2024-05-04 DIAGNOSIS — E785 Hyperlipidemia, unspecified: Secondary | ICD-10-CM | POA: Diagnosis not present

## 2024-05-04 DIAGNOSIS — F324 Major depressive disorder, single episode, in partial remission: Secondary | ICD-10-CM | POA: Diagnosis not present

## 2024-05-04 DIAGNOSIS — I5040 Unspecified combined systolic (congestive) and diastolic (congestive) heart failure: Secondary | ICD-10-CM | POA: Diagnosis not present

## 2024-05-10 ENCOUNTER — Ambulatory Visit: Attending: Family Medicine | Admitting: Physical Therapy

## 2024-05-24 ENCOUNTER — Ambulatory Visit: Admitting: Physical Therapy

## 2024-05-24 ENCOUNTER — Other Ambulatory Visit: Payer: Medicare HMO

## 2024-06-03 DIAGNOSIS — I5040 Unspecified combined systolic (congestive) and diastolic (congestive) heart failure: Secondary | ICD-10-CM | POA: Diagnosis not present

## 2024-06-03 DIAGNOSIS — I10 Essential (primary) hypertension: Secondary | ICD-10-CM | POA: Diagnosis not present

## 2024-06-03 DIAGNOSIS — F324 Major depressive disorder, single episode, in partial remission: Secondary | ICD-10-CM | POA: Diagnosis not present

## 2024-06-03 DIAGNOSIS — E785 Hyperlipidemia, unspecified: Secondary | ICD-10-CM | POA: Diagnosis not present

## 2024-06-06 ENCOUNTER — Encounter: Admitting: Physical Therapy

## 2024-06-21 ENCOUNTER — Encounter: Admitting: Physical Therapy

## 2024-07-01 DIAGNOSIS — R7303 Prediabetes: Secondary | ICD-10-CM | POA: Diagnosis not present

## 2024-07-04 DIAGNOSIS — I10 Essential (primary) hypertension: Secondary | ICD-10-CM | POA: Diagnosis not present

## 2024-07-04 DIAGNOSIS — F324 Major depressive disorder, single episode, in partial remission: Secondary | ICD-10-CM | POA: Diagnosis not present

## 2024-07-04 DIAGNOSIS — E785 Hyperlipidemia, unspecified: Secondary | ICD-10-CM | POA: Diagnosis not present

## 2024-07-04 DIAGNOSIS — I5040 Unspecified combined systolic (congestive) and diastolic (congestive) heart failure: Secondary | ICD-10-CM | POA: Diagnosis not present

## 2024-07-08 ENCOUNTER — Encounter (HOSPITAL_COMMUNITY)

## 2024-07-10 NOTE — Progress Notes (Unsigned)
 Cardiology Office Note    Date:  07/11/2024  ID:  Debbie [name], Debbie [name] 03-20-1951, MRN [id number] PCP:  Teresa Channel, MD  Cardiologist:  Wilbert Bihari, MD  Electrophysiologist:  None   Chief Complaint: Syncope   History of Present Illness: .    Debbie [name] is a 73 y.o. female with visit-pertinent history of hypertension, hyperlipidemia, chronic shortness of breath and atherosclerotic coronary artery disease.  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  Patient was seen in clinic on 02/20/2024 by Dr. Bihari for follow-up.  It was noted she had chronic dyspnea on exertion that was unchanged.  Patient reported dizziness and feeling as though she was going to pass out but also described feeling as though the room was spinning and felt off balance in the morning.  Echocardiogram and cardiac monitor was recommended.  Patient was also positive for orthostatics in office and her lisinopril  was discontinued.  ZIO monitor revealed brief PAT, rare PVCs and PACs.  She was seen in clinic on 04/10/2024 by Artist Pouch, PA.  Patient experienced dizziness described as room spinning sensation, exacerbated by head movements.  Patient reported that despite discontinuation of lisinopril  her dizziness persisted.  Vestibular physical therapy was recommended.  Patient's metoprolol  succinate was increased to 37.5 mg daily.  Echocardiogram on 04/17/2024 indicated LVEF 45 to 50%, LV internal cavity size is mildly dilated, G1 DD, RV systolic function and size was normal, no evidence of mitral valve regurgitation, no evidence of stenosis, aortic valve sclerosis present without evidence of stenosis, aortic regurgitation was not visualized.  There were no significant changes from her prior studies.  Today she presents for follow-up.  She reports that she had a syncopal episode 2 weeks ago.   She reports she was walking with her husband and her grandson when she suddenly became extremely dizzy and lightheaded, then proceeded to black out and fall.  She reports that she was unconscious for less than a minute, she does not believe that she has had any seizure-like activity.  Patient notes that she has been having intermittent dizziness and lightheadedness ongoing for months, previously associated with orthostatic hypotension.  She reports that she did attend vestibular therapy and was told that she did not have vertigo.  Patient notes that she has chronic dyspnea on exertion that has been present for years and is overall unchanged, does not believe that she was significantly short of breath prior to syncopal episode and is unsure if she was having any palpitations prior to syncope.  Patient reports that when she sings while at church she becomes increasingly short of breath. Patient notes that her palpitations have improved with increased dose of metoprolol .  She denies any chest pain, significant lower extremity edema, orthopnea or PND.  Patient reports that she stays very well-hydrated, trying to regularly drink Pedialyte and is eating regular meals.  She notes that in recent months she has had congestion and increased headaches, she felt that this was related more so to allergies.  ROS: .   Today she denies chest pain, lower extremity edema, fatigue, palpitations, melena, hematuria, hemoptysis, diaphoresis, orthopnea, and PND.  All other systems are reviewed and otherwise negative. Studies Reviewed: SABRA   EKG:  EKG is ordered today, personally reviewed, demonstrating  EKG Interpretation Date/Time:  Thursday July 11 2024 09:36:00 EDT Ventricular Rate:  61 PR  Interval:  132 QRS Duration:  82 QT Interval:  404 QTC Calculation: 406 R Axis:   -25  Text Interpretation: Normal sinus rhythm Nonspecific ST and T wave abnormality When compared with ECG of 20-Feb-2024 10:25, No significant change  was found Confirmed by Almadelia Looman [phone]) on 07/11/2024 6:04:14 PM   CV Studies: Cardiac studies reviewed are outlined and summarized above. Otherwise please see EMR for full report. Cardiac Studies & Procedures   ______________________________________________________________________________________________   STRESS TESTS  MYOCARDIAL PERFUSION IMAGING 09/16/2020  Interpretation Summary  <10mm ST depression at rest, mild worsening with stress  Nuclear stress EF: 55%.  The left ventricular ejection fraction is normal (55-65%).  The study is normal.  This is a low risk study.   ECHOCARDIOGRAM  ECHOCARDIOGRAM COMPLETE 04/17/2024  Narrative ECHOCARDIOGRAM REPORT    Patient Name:   Debbie [name] Witham Date of Exam: 04/17/2024 Medical Rec #:  [id number]            Height:       63.0 in Accession #:    [id number]           Weight:       157.2 lb Date of Birth:  1951/05/10             BSA:          1.746 m Patient Age:    72 years             BP:           136/88 mmHg Patient Gender: F                    HR:           71 bpm. Exam Location:  [address]  Procedure: 2D Echo, Cardiac Doppler and Color Doppler (Both Spectral and Color Flow Doppler were utilized during procedure).  Indications:    I51.9 Decreased LVF  History:        Patient has prior history of Echocardiogram examinations, most recent 12/12/2023. CAD, Signs/Symptoms:Chronic SOB; Risk Factors:Hypertension and Dyslipidemia. Previous study showed LVEF 55%.  Sonographer:    Nolon Berg BA, RDCS Referring Phys: [phone] TRACI R TURNER  IMPRESSIONS   1. Left ventricular ejection fraction, by estimation, is 45 to 50%. The left ventricle has mildly decreased function. The left ventricle demonstrates global hypokinesis. The left ventricular internal cavity size was mildly dilated. Left ventricular diastolic parameters are consistent with Grade I diastolic dysfunction (impaired relaxation). 2. Right ventricular systolic  function is normal. The right ventricular size is normal. 3. The mitral valve is normal in structure. No evidence of mitral valve regurgitation. No evidence of mitral stenosis. 4. The aortic valve is calcified. There is mild calcification of the aortic valve. There is mild thickening of the aortic valve. Aortic valve regurgitation is not visualized. Aortic valve sclerosis is present, with no evidence of aortic valve stenosis. 5. The inferior vena cava is normal in size with greater than 50% respiratory variability, suggesting right atrial pressure of 3 mmHg.  Comparison(s): No significant change from prior study. Prior images reviewed side by side.  FINDINGS Left Ventricle: Left ventricular ejection fraction, by estimation, is 45 to 50%. The left ventricle has mildly decreased function. The left ventricle demonstrates global hypokinesis. The left ventricular internal cavity size was mildly dilated. There is no left ventricular hypertrophy. Left ventricular diastolic parameters are consistent with Grade I diastolic dysfunction (impaired relaxation).  Right Ventricle: The right ventricular size is normal. No  increase in right ventricular wall thickness. Right ventricular systolic function is normal.  Left Atrium: Left atrial size was normal in size.  Right Atrium: Right atrial size was normal in size.  Pericardium: There is no evidence of pericardial effusion.  Mitral Valve: The mitral valve is normal in structure. No evidence of mitral valve regurgitation. No evidence of mitral valve stenosis.  Tricuspid Valve: The tricuspid valve is normal in structure. Tricuspid valve regurgitation is not demonstrated. No evidence of tricuspid stenosis.  Aortic Valve: The aortic valve is calcified. There is mild calcification of the aortic valve. There is mild thickening of the aortic valve. Aortic valve regurgitation is not visualized. Aortic valve sclerosis is present, with no evidence of aortic  valve stenosis.  Pulmonic Valve: The pulmonic valve was normal in structure. Pulmonic valve regurgitation is not visualized. No evidence of pulmonic stenosis.  Aorta: The aortic root is normal in size and structure.  Venous: The inferior vena cava is normal in size with greater than 50% respiratory variability, suggesting right atrial pressure of 3 mmHg.  IAS/Shunts: No atrial level shunt detected by color flow Doppler.   LEFT VENTRICLE PLAX 2D LVIDd:         4.90 cm   Diastology LVIDs:         3.50 cm   LV e' medial:    3.05 cm/s LV PW:         1.20 cm   LV E/e' medial:  19.8 LV IVS:        0.90 cm   LV e' lateral:   7.72 cm/s LVOT diam:     2.00 cm   LV E/e' lateral: 7.8 LV SV:         52 LV SV Index:   30 LVOT Area:     3.14 cm   RIGHT VENTRICLE RV Basal diam:  3.00 cm RV Mid diam:    1.90 cm RV S prime:     12.60 cm/s TAPSE (M-mode): 2.1 cm  LEFT ATRIUM             Index        RIGHT ATRIUM           Index LA diam:        3.80 cm 2.18 cm/m   RA Area:     11.70 cm LA Vol (A2C):   17.8 ml 10.20 ml/m  RA Volume:   25.40 ml  14.55 ml/m LA Vol (A4C):   23.9 ml 13.69 ml/m LA Biplane Vol: 20.9 ml 11.97 ml/m AORTIC VALVE LVOT Vmax:   96.90 cm/s LVOT Vmean:  63.900 cm/s LVOT VTI:    0.164 m  AORTA Ao Root diam: 3.10 cm Ao Asc diam:  3.30 cm  MITRAL VALVE MV Area (PHT): 3.54 cm    SHUNTS MV Decel Time: 214 msec    Systemic VTI:  0.16 m MV E velocity: 60.30 cm/s  Systemic Diam: 2.00 cm MV A velocity: 80.20 cm/s MV E/A ratio:  0.75  Oneil Parchment MD Electronically signed by Oneil Parchment MD Signature Date/Time: 04/17/2024/4:05:11 PM    Final    MONITORS  LONG TERM MONITOR (3-14 DAYS) 03/25/2024  Narrative   Predominant rhythm was normal sinus rhythm with an average heart rate of 63 bpm and ranged from 41 to 125 bpm   2 episodes of SVT consistent with atrial tachycardia with the longest run lasting 6 beats and the fastest run at 146 bpm   1 episode of  wide-complex  tachycardia lasting 6 beats consistent with nonsustained ventricular tachycardia   Rare PACs, atrial couplets   Rare PVCs and ventricular couplets as well as ventricular bigeminy   Patch Wear Time:  13 days and 23 hours (2025-04-01T10:51:48-0400 to 2025-04-15T10:23:23-0400)  Patient had a min HR of 41 bpm, max HR of 146 bpm, and avg HR of 63 bpm. Predominant underlying rhythm was Sinus Rhythm. 1 run of Ventricular Tachycardia occurred lasting 6 beats with a max rate of 106 bpm (avg 102 bpm). 2 Supraventricular Tachycardia runs occurred, the run with the fastest interval lasting 4 beats with a max rate of 146 bpm, the longest lasting 6 beats with an avg rate of 111 bpm. Isolated SVEs were rare (<1.0%), SVE Couplets were rare (<1.0%), and no SVE Triplets were present. Isolated VEs were rare (<1.0%), VE Couplets were rare (<1.0%), and no VE Triplets were present. Ventricular Bigeminy was present.   CT SCANS  CT CORONARY MORPH W/CTA COR W/SCORE 05/03/2023  Addendum 05/08/2023  8:38 AM ADDENDUM REPORT: 05/08/2023 08:35  EXAM: OVER-READ INTERPRETATION  CT CHEST  The following report is an over-read performed by radiologist Dr. Lynwood Seip [facility] on 05/08/2023. This over-read does not include interpretation of cardiac or coronary anatomy or pathology. The coronary CTA interpretation by the cardiologist is attached.  COMPARISON:  September 14, 2020.  FINDINGS: The visualized portions of the extracardiac vascular structures are unremarkable. Moderate size hiatal hernia is again noted. Visualized portion of upper abdomen is unremarkable. No acute abnormality involving visualized pulmonary parenchyma. Visualized skeleton is unremarkable.  IMPRESSION: Moderate size hiatal hernia.   Electronically Signed By: Lynwood Seip Raddle M.D. On: 05/08/2023 08:35  Narrative CLINICAL DATA:  This is a 73 year old female with anginal symptoms.  EXAM: Cardiac/Coronary   CTA  TECHNIQUE: The patient was scanned on a [employer].  FINDINGS: A 100 kV prospective scan was triggered in the descending thoracic aorta at 111 HU's. Axial non-contrast 3 mm slices were carried out through the heart. The data set was analyzed on a dedicated work station and scored using the Agatson method. Gantry rotation speed was 250 msecs and collimation was .6 mm. No beta blockade and 0.8 mg of sl NTG was given. The 3D data set was reconstructed in 5% intervals of the 67-82 % of the R-R cycle. Diastolic phases were analyzed on a dedicated work station using MPR, MIP and VRT modes. The patient received 80 cc of contrast.  Image Quality: Fair, with misregistration artifact.  Aorta: Normal size. Mild aortic root calcifications. No dissection.  Aortic Valve:  Trileaflet.  No calcifications.  Coronary Arteries:  Normal coronary origin.  Right dominance.  RCA is a large dominant artery that gives rise to PDA and PLA. There is minimal (<24%) focal calcification in the proximal RCA. The distal RCA with minimal calcification. Moderate (50-69%) focal calcification in the proximal PDA.  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  LAD is a large vessel. Anomalous origin of the left main from the right coronary cusp crosses anterior to the pulmonary artery and the LAD dives into the anterior interventricular sulcus. There is a moderate focal calcification in the proximal LAD. The mid and distal LAD with minimal calcification.  LCX is a non-dominant artery that gives rise to one large OM1 branch. There are minimal focal calcifications in the mid LCX.  OM1 is a small artery.  Coronary Calcium  Score:  Left main: 0  Left anterior descending artery: 64.7  Left circumflex artery: 10  Right coronary artery: 247  Total: 322  Percentile:  89  Other findings:  Normal pulmonary vein drainage into the left atrium.  Normal left atrial appendage without a thrombus.  Normal size of the pulmonary artery.  IMPRESSION: 1. Coronary calcium  score of 322. This was 85 percentile for age and sex matched control.  2. 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  3. CAD-RADS 3. Moderate stenosis. Consider symptom-guided anti-ischemic pharmacotherapy as well as risk factor modification per guideline directed care. Additional analysis with CT FFR will be submitted.  The noncardiac portion of this study will be interpreted in separate report by the radiologist.  Electronically Signed: By: Kardie  Tobb D.O. On: 05/03/2023 15:40   CT SCANS  CT CORONARY MORPH W/CTA COR W/SCORE 11/07/2019  Addendum 11/07/2019  4:43 PM ADDENDUM REPORT: 11/07/2019 16:41  EXAM: OVER-READ INTERPRETATION  CT CHEST  The following report is an over-read performed by radiologist Dr. Juliene Cramp [facility] on 11/07/2019. This over-read does not include interpretation of cardiac or coronary anatomy or pathology. The coronary CTA interpretation by the cardiologist is attached.  COMPARISON:  02/26/2018  FINDINGS: Vascular structures: Normal caliber of the visualized thoracic aorta without dissection. Normal caliber of the main pulmonary artery.  Mediastinal structures: Again noted is a large hiatal hernia. No significant lymphadenopathy in the visualized mediastinum.  Upper abdominal structures: No acute abnormality.  Lungs: Persistent densities at the base of the lingula likely represent scarring or atelectasis. Hazy parenchymal densities in the central aspect of the right lower lobe are similar to the previous examination. No large pleural effusions. Prior examination demonstrated a 5 mm nodular density near the posterior base of the right middle lobe but this nodular density is no longer  present and was probably related to atelectasis.  Musculoskeletal: Bridging osteophytes in the visualized spine.  IMPRESSION: No acute chest abnormality.  Stable densities in the lingula likely representing areas of scarring.  Large hiatal hernia.   Electronically Signed By: Juliene Balder M.D. On: 11/07/2019 16:41  Narrative CLINICAL DATA:  chestpain  EXAM: Cardiac/Coronary  CTA  TECHNIQUE: The patient was scanned on a Siem[employer] Som[employer] go.Top scanner.  FINDINGS: A retrospective scan was triggered in the descending thoracic aorta. Axial non-contrast 3 mm slices were carried out through the heart. The data set was analyzed on a dedicated work station and scored using the Agatson method. Gantry rotation speed was 330 msecs and collimation was .6 mm. 100mg  of metoprolol  and 0.8 mg of sl NTG was given. The 3D data set was reconstructed in 5% intervals of the 5-95 % of the R-R cycle. Diastolic phases were analyzed on a dedicated work station using MPR, MIP and VRT modes. The patient received 100 cc of contrast.  Aorta:  Normal size.  No calcifications.  No dissection.  Aortic Valve:  Trileaflet.  No calcifications.  Coronary Arteries: Anomalous coronary origin. LAD originates from the right coronary cusp with and courses in front of the pulmonary artery (pre-pulmonic). Right dominance.  RCA is a large dominant artery that gives rise to PDA and PLA. There mild calcifications noted in mid RCA with no stenosis  No left main artery noted.  LAD is a large vessel originating from the right coronary cusp and courses anterior to the pulmonary artery giving off branches to the  anterior and anterolateral myocardium. There is mild non obstructive, calcified plaque in the mid LAD.  LCX is a small non-dominant artery, originating from the distal LAD, terminating in the atrioventricular groove. There is no plaque.  Other findings:  Normal pulmonary vein drainage into the left  atrium.  Normal left atrial appendage without a thrombus.  Normal size of the pulmonary artery.  IMPRESSION: 1. Coronary calcium  score of 146. This was 2 percentile for age and sex matched control.  2. 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 Right dominance.  3.  Mild non obstructive calcified plaque in the mid LAD  CAD-RADS 2. Mild non-obstructive CAD (25-49%). Consider non-atherosclerotic causes of chest pain. Consider preventive therapy and risk factor modification.  Electronically Signed: By: Redell Cave M.D. On: 11/07/2019 15:34   CT SCANS  CT CORONARY MORPH W/CTA COR W/SCORE 02/26/2018  Addendum 02/28/2018  2:19 PM ADDENDUM REPORT: 02/28/2018 14:16  CLINICAL DATA:  72 year old female with h/o hypertension, hyperlipidemia, atypical chest pain and DOE.  EXAM: Cardiac/Coronary  CT  TECHNIQUE: The patient was scanned on a [employer].  FINDINGS: A 120 kV prospective scan was triggered in the descending thoracic aorta at 111 HU's. Axial non-contrast 3 mm slices were carried out through the heart. The data set was analyzed on a dedicated work station and scored using the Agatson method. Gantry rotation speed was 250 msecs and collimation was .6 mm. 5 mg of iv Metoprolol  and 0.8 mg of sl NTG was given. The 3D data set was reconstructed in 5% intervals of the 67-82 % of the R-R cycle. Diastolic phases were analyzed on a dedicated work station using MPR, MIP and VRT modes. The patient received 80 cc of contrast.  Aorta:  Normal size.  No calcifications.  No dissection.  Aortic Valve:  Trileaflet.  No calcifications.  Coronary Arteries:  Anomalous coronary origin.  Right dominance.  There is anomalous coronary origin. Left and right coronary artery originate from the right coronary sinus with separate origin adjacent to each  other.  RCA is a very large dominant artery that gives rise to PDA, PLVB and two obtuse marginal branches. RCA has a normal course but continues as PLA and gives rise to two OM branches supplying entire lateral wall in the LCX territory.  There is minimal plaque with stenosis 0-25% plaque.  Left coronary artery is large artery that originates from the right coronary sinus next to the RCA. It has anterior course and runs anteriorly to the pulmonary artery. It gives rise to a three fairly small arteries that supply LAD territory and another small artery that represents LCX artery. There is no plaque.  Other findings:  Normal pulmonary vein drainage into the left atrium.  Normal let atrial appendage without a thrombus.  Normal size of the pulmonary artery.  IMPRESSION: 1. Coronary calcium  score of 24. This was 72 percentile for age and sex matched control.  2. Anomalous coronary origin with right dominance.  RCA is a very large dominant artery that gives rise to PDA, PLVB and two obtuse marginal branches. RCA has a normal course but continues as PLA and gives rise to two OM branches supplying entire lateral wall in the LCX territory.  There is minimal plaque with stenosis 0-25% plaque.  Left coronary artery is large artery that originates from the right coronary sinus next to the RCA. It has anterior course and runs  anteriorly to the pulmonary artery. It gives rise to a three fairly small arteries that supply LAD territory and another small artery that represents LCX artery. There is no plaque.  3. Minimal CAD.  4. No other congenital heart defects were identified.  This case needs to be discussed with CT surgery.   Electronically Signed By: Leim Moose On: 02/28/2018 14:16  Narrative EXAM: OVER-READ INTERPRETATION  CT CHEST  The following report is an over-read performed by radiologist Dr. Toribio Aye of [facility] on 02/26/2018.  This over-read does not include interpretation of cardiac or coronary anatomy or pathology. The coronary calcium  score/coronary CTA interpretation by the cardiologist is attached.  COMPARISON:  None.  FINDINGS: Large hiatal hernia. 5 mm nodule in the periphery of the lateral segment of the right middle lobe (axial image 24 of series 12). Within the visualized portions of the thorax there are no other larger more suspicious appearing pulmonary nodules or masses, there is no acute consolidative airspace disease, no pleural effusions, no pneumothorax and no lymphadenopathy. Mild scarring in the inferior segment of the lingula. Visualized portions of the upper abdomen are unremarkable. There are no aggressive appearing lytic or blastic lesions noted in the visualized portions of the skeleton.  IMPRESSION: 1. 5 mm nodule in the lateral segment of the right middle lobe (axial image 24 of series 12). No follow-up needed if patient is low-risk. Non-contrast chest CT can be considered in 12 months if patient is high-risk. This recommendation follows the consensus statement: Guidelines for Management of Incidental Pulmonary Nodules Detected on CT Images: From the Fleischner Society 2017; Radiology 2017; 284:228-243. 2. Large hiatal hernia.  Electronically Signed: By: Toribio Aye M.D. On: 02/26/2018 15:35     ______________________________________________________________________________________________       Current Reported Medications:.    Current Meds  Medication Sig   aspirin  EC 81 MG tablet Take 1 tablet (81 mg total) by mouth daily.   atorvastatin  (LIPITOR ) 80 MG tablet Take 80 mg by mouth daily.   cetirizine (ZYRTEC) 10 MG tablet Take 10 mg by mouth at bedtime.   CYMBALTA  60 MG capsule Take 120 mg by mouth at bedtime.   diphenhydrAMINE (BENADRYL) 25 MG tablet Take 25 mg by mouth daily as needed for allergies.   ezetimibe  (ZETIA ) 10 MG tablet Take 1 tablet (10 mg total)  by mouth daily.   fluticasone  (FLONASE ) 50 MCG/ACT nasal spray Place 2 sprays into both nostrils daily as needed for allergies or rhinitis.    ibuprofen  (ADVIL ) 200 MG tablet Take 400 mg by mouth every 6 (six) hours as needed for headache.   isosorbide  mononitrate (IMDUR ) 30 MG 24 hr tablet Take 1 tablet (30 mg total) by mouth daily.   loperamide (IMODIUM A-D) 2 MG tablet Take 4 mg by mouth 4 (four) times daily as needed for diarrhea or loose stools.   metoprolol  succinate (TOPROL  XL) 25 MG 24 hr tablet Take 1.5 tablets (37.5 mg total) by mouth daily.   Multiple Vitamins-Minerals (ADULT GUMMY PO) Take 1 each by mouth daily.   pantoprazole  (PROTONIX ) 40 MG tablet Take 40 mg by mouth 2 (two) times daily.   triamcinolone  ointment (KENALOG ) 0.1 % Apply 1 Application topically 2 (two) times daily as needed (rash).   Vitamin D -Vitamin K (VITAMIN K2-VITAMIN D3 PO) Take 1 tablet by mouth daily.   [DISCONTINUED] isosorbide  mononitrate (IMDUR ) 60 MG 24 hr tablet TAKE 1 TABLET EVERY DAY   Physical Exam:    VS:  BP  131/89   Pulse 84   Ht 5' 3 (1.6 m)   Wt 158 lb (71.7 kg)   SpO2 96%   BMI 27.99 kg/m    Wt Readings from Last 3 Encounters:  07/11/24 158 lb (71.7 kg)  04/10/24 157 lb 3.2 oz (71.3 kg)  03/19/24 154 lb 5.2 oz (70 kg)    GEN: Well nourished, well developed in no acute distress NECK: No JVD; No carotid bruits CARDIAC: RRR, no murmurs, rubs, gallops RESPIRATORY:  Clear to auscultation without rales, wheezing or rhonchi  ABDOMEN: Soft, non-tender, non-distended EXTREMITIES:  No edema; No acute deformity     Asessement and Plan:.    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  Patient denies any chest pain, reports chronic dyspnea on exertion that is unchanged.  Will decrease patient's Imdur  to 30 mg daily, stressed with patient that if she experiences any  chest pain she is to notify the office.  Continue aspirin  81 mg daily, Lipitor  80 mg daily, Zetia  10 mg daily, metoprolol  succinate 37.5 mg daily and Imdur  30 mg daily.  Dizziness/lightheadedness/syncope/orthostatic hypotension: Patient reports that she has had a syncopal episode 2 weeks ago, witnessed by her son and grandson.  Patient reports that prior to blacking out she was increasingly dizzy and lightheaded.  She did not present to the emergency room for evaluation.  She reports that she has been staying well-hydrated however continues to note intermittent episodes of dizziness and lightheadedness.  She reports that she did attend vestibular therapy and was told that she did not have vertigo.  Patient's orthostatics today show a significant drop in blood pressure, she did report increased dizziness and lightheadedness with this.  Discussed with Dr. Shlomo, will reduce Imdur  to 30 mg daily, continue metoprolol  given significant improvement in palpitations.  Encouraged patient to complete slow position changes and to wear compression stockings, prescription provided.  Will also check cardiac MRI given syncopal episode, mildly reduced EF and history of palpitations.  Check cortisol level, CBC, bmet, magnesium  and TSH.  Reviewed ED precautions. I did discuss the [place] DMV medical guidelines for driving: it is prudent to recommend that all persons should be free of syncopal episodes for at least six months to be granted the driving privilege. (THE [employer]  PHYSICIAN'S GUIDE TO DRIVER MEDICAL EVALUATION, Second Edition, Medical Review Branch, Associate Professor, Division of [employer], [place]  Department of Transportation, July 2004) If patient's symptoms persist on follow-up will need to consider discontinuation of Imdur  and adding low-dose midodrine. Orthostatic VS for the past 24 hrs (Last 3 readings):  BP- Lying Pulse- Lying BP- Sitting Pulse- Sitting BP- Standing at 0 minutes Pulse-  Standing at 0 minutes BP- Standing at 3 minutes Pulse- Standing at 3 minutes  07/11/24 0935 (!) 173/97 62 131/89 77 109/71 81 (!) 170/98 73    DOE: Patient has noted increased dyspnea on exertion for years, denies any significant changes.  She notes that when she is singing at church she becomes significantly short of breath.  Will refer to pulmonology for further evaluation.  Palpitations: Patient has reported frequent palpitations associate with exertion.  Previous ZIO monitor showed PAT, PVCs and PACs.  At last visit her metoprolol  succinate was increased to 37.5 mg daily.  She reports that the increased doses of metoprolol  have significantly improved her palpitations.  If patient  continues to have presyncopal symptoms with resolution of orthostatic hypotension may need to consider EP referral for loop recorder.  Mildly reduced EF: Echocardiogram on 04/17/2024 indicated LVEF 45 to 50%, G1 DD, RV systolic function and size was normal, no evidence of mitral valve regurgitation or stenosis, aortic valve sclerosis was present without evidence of stenosis.  Per notes patient has previously been unable to tolerate Jardiance or Lasix  resulting in orthostatic hypotension.  Check cardiac MRI as noted above.  HLD: Last lipid profile on 10/03/23 indicated LDL 45, HDL 75.  Continue atorvastatin  80 mg daily and Zetia  10 mg daily.   Disposition: F/u with Milano Rosevear, NP in 6-8 weeks   Signed, Mallory Enriques D Tryniti Laatsch, NP

## 2024-07-11 ENCOUNTER — Ambulatory Visit: Attending: Cardiology | Admitting: Cardiology

## 2024-07-11 ENCOUNTER — Encounter: Payer: Self-pay | Admitting: Cardiology

## 2024-07-11 VITALS — BP 131/89 | HR 84 | Ht 63.0 in | Wt 158.0 lb

## 2024-07-11 DIAGNOSIS — R42 Dizziness and giddiness: Secondary | ICD-10-CM | POA: Diagnosis not present

## 2024-07-11 DIAGNOSIS — R002 Palpitations: Secondary | ICD-10-CM | POA: Diagnosis not present

## 2024-07-11 DIAGNOSIS — R55 Syncope and collapse: Secondary | ICD-10-CM

## 2024-07-11 DIAGNOSIS — E7800 Pure hypercholesterolemia, unspecified: Secondary | ICD-10-CM

## 2024-07-11 DIAGNOSIS — I2510 Atherosclerotic heart disease of native coronary artery without angina pectoris: Secondary | ICD-10-CM

## 2024-07-11 DIAGNOSIS — R0609 Other forms of dyspnea: Secondary | ICD-10-CM | POA: Diagnosis not present

## 2024-07-11 DIAGNOSIS — R0602 Shortness of breath: Secondary | ICD-10-CM

## 2024-07-11 DIAGNOSIS — I10 Essential (primary) hypertension: Secondary | ICD-10-CM | POA: Diagnosis not present

## 2024-07-11 MED ORDER — ISOSORBIDE MONONITRATE ER 30 MG PO TB24: 30.0000 mg | ORAL_TABLET | Freq: Every day | ORAL | 3 refills | Status: DC

## 2024-07-11 NOTE — Patient Instructions (Addendum)
 Medication Instructions:  Decrease Imdur  to 30 mg once a day  *If you need a refill on your cardiac medications before your next appointment, please call your pharmacy*  Lab Work: Today we are going to draw a fasting CBC, Bmet, Mag, TSH, and Cortisol If you have labs (blood work) drawn today and your tests are completely normal, you will receive your results only by: MyChart Message (if you have MyChart) OR A paper copy in the mail If you have any lab test that is abnormal or we need to change your treatment, we will call you to review the results.  Testing/Procedures: Your physician has requested that you have a cardiac MRI. Cardiac MRI uses a computer to create images of your heart as its beating, producing both still and moving pictures of your heart and major blood vessels. For further information please visit [email or web address]. Please follow the instruction sheet given to you today for more information.  Follow-Up: At [facility] you and your health needs are our priority.  As part of our continuing mission to provide you with exceptional heart care, our providers are all part of one team.  This team includes your primary Cardiologist (physician) and Advanced Practice Providers or APPs (Physician Assistants and Nurse Practitioners) who all work together to provide you with the care you need, when you need it.  Your next appointment:   4-6 week(s)  Provider:   Katlyn West, NP or any APP  Referrals: We are going to refer you to pulmonology.  We recommend signing up for the patient portal called MyChart.  Sign up information is provided on this After Visit Summary.  MyChart is used to connect with patients for Virtual Visits (Telemedicine).  Patients are able to view lab/test results, encounter notes, upcoming appointments, etc.  Non-urgent messages can be sent to your provider as well.   To learn more about what you can do with MyChart, go to [email or web address].    Other Instructions: When you making position changes make them slowly to help prevent any dizziness and lightheadedness.  Please take your blood pressure daily for 2 weeks and send in a MyChart message. Please include heart rates. (One message at the end of the 2 weeks).   HOW TO TAKE YOUR BLOOD PRESSURE: Rest 5 minutes before taking your blood pressure. Don't smoke or drink caffeinated beverages for at least 30 minutes before. Take your blood pressure before (not after) you eat. Sit comfortably with your back supported and both feet on the floor (don't cross your legs). Elevate your arm to heart level on a table or a desk. Use the proper sized cuff. It should fit smoothly and snugly around your bare upper arm. There should be enough room to slip a fingertip under the cuff. The bottom edge of the cuff should be 1 inch above the crease of the elbow. Ideally, take 3 measurements at one sitting and record the average.

## 2024-07-12 DIAGNOSIS — I2510 Atherosclerotic heart disease of native coronary artery without angina pectoris: Secondary | ICD-10-CM | POA: Diagnosis not present

## 2024-07-12 DIAGNOSIS — R42 Dizziness and giddiness: Secondary | ICD-10-CM | POA: Diagnosis not present

## 2024-07-12 DIAGNOSIS — I10 Essential (primary) hypertension: Secondary | ICD-10-CM | POA: Diagnosis not present

## 2024-07-12 DIAGNOSIS — R0609 Other forms of dyspnea: Secondary | ICD-10-CM | POA: Diagnosis not present

## 2024-07-12 DIAGNOSIS — R55 Syncope and collapse: Secondary | ICD-10-CM | POA: Diagnosis not present

## 2024-07-12 DIAGNOSIS — R002 Palpitations: Secondary | ICD-10-CM | POA: Diagnosis not present

## 2024-07-12 DIAGNOSIS — E7800 Pure hypercholesterolemia, unspecified: Secondary | ICD-10-CM | POA: Diagnosis not present

## 2024-07-13 LAB — MAGNESIUM: Magnesium: 1.7 mg/dL (ref 1.6–2.3)

## 2024-07-13 LAB — BASIC METABOLIC PANEL WITH GFR
BUN/Creatinine Ratio: 15 (ref 12–28)
BUN: 11 mg/dL (ref 8–27)
CO2: 24 mmol/L (ref 20–29)
Calcium: 9 mg/dL (ref 8.7–10.3)
Chloride: 102 mmol/L (ref 96–106)
Creatinine, Ser: 0.72 mg/dL (ref 0.57–1.00)
Glucose: 96 mg/dL (ref 70–99)
Potassium: 3.5 mmol/L (ref 3.5–5.2)
Sodium: 143 mmol/L (ref 134–144)
eGFR: 90 mL/min/1.73 (ref >59)

## 2024-07-13 LAB — CBC
Hematocrit: 41.6 % (ref 34.0–46.6)
Hemoglobin: 13.1 g/dL (ref 11.1–15.9)
MCH: 27.2 pg (ref 26.6–33.0)
MCHC: 31.5 g/dL (ref 31.5–35.7)
MCV: 87 fL (ref 79–97)
Platelets: 349 x10E3/uL (ref 150–450)
RBC: 4.81 x10E6/uL (ref 3.77–5.28)
RDW: 15.5 % — ABNORMAL HIGH (ref 11.7–15.4)
WBC: 7.6 x10E3/uL (ref 3.4–10.8)

## 2024-07-13 LAB — CORTISOL: Cortisol: 12.4 ug/dL (ref 6.2–19.4)

## 2024-07-13 LAB — TSH: TSH: 4.02 u[IU]/mL (ref 0.450–4.500)

## 2024-07-15 ENCOUNTER — Ambulatory Visit: Payer: Self-pay | Admitting: Cardiology

## 2024-07-15 DIAGNOSIS — M79662 Pain in left lower leg: Secondary | ICD-10-CM | POA: Diagnosis not present

## 2024-07-15 DIAGNOSIS — W19XXXA Unspecified fall, initial encounter: Secondary | ICD-10-CM | POA: Diagnosis not present

## 2024-07-18 ENCOUNTER — Ambulatory Visit: Admit: 2024-07-18 | Admitting: Urology

## 2024-07-18 SURGERY — SACROCOLPOPEXY, ROBOT-ASSISTED, LAPAROSCOPIC
Anesthesia: General

## 2024-07-18 NOTE — Telephone Encounter (Signed)
 Called patient advised of below they verbalized understanding.

## 2024-07-18 NOTE — Telephone Encounter (Signed)
-----   Message from Katlyn D West sent at 07/15/2024  7:22 AM EDT ----- Please let Debbie [name] know that her CBC shows no evidence of anemia or infection. Her kidney function and electrolytes are normal. Her potassium is in low normal range and her magnesium  level is low  normal, recommend increasing intake of magnesium  and potassium rich foods such as cashews, almonds, spinach, bananas, salmon, avocado broccoli and black beans. Her cortisol level and thyroid   stimulating hormone level were normal. Overall good results, follow up as planned.  ----- Message ----- From: Rebecka Memos Lab Results In Sent: 07/12/2024   8:40 PM EDT To: Katlyn D West, NP

## 2024-07-24 ENCOUNTER — Other Ambulatory Visit: Payer: Self-pay | Admitting: Nurse Practitioner

## 2024-07-31 NOTE — Progress Notes (Signed)
 Addending encounter for MD cosign.

## 2024-08-04 DIAGNOSIS — I10 Essential (primary) hypertension: Secondary | ICD-10-CM | POA: Diagnosis not present

## 2024-08-04 DIAGNOSIS — F324 Major depressive disorder, single episode, in partial remission: Secondary | ICD-10-CM | POA: Diagnosis not present

## 2024-08-04 DIAGNOSIS — E785 Hyperlipidemia, unspecified: Secondary | ICD-10-CM | POA: Diagnosis not present

## 2024-08-04 DIAGNOSIS — I5040 Unspecified combined systolic (congestive) and diastolic (congestive) heart failure: Secondary | ICD-10-CM | POA: Diagnosis not present

## 2024-08-12 ENCOUNTER — Encounter: Payer: Self-pay | Admitting: *Deleted

## 2024-08-13 ENCOUNTER — Encounter: Payer: Self-pay | Admitting: Emergency Medicine

## 2024-08-13 ENCOUNTER — Ambulatory Visit: Attending: Emergency Medicine | Admitting: Emergency Medicine

## 2024-08-13 VITALS — BP 150/88 | HR 68 | Ht 63.0 in | Wt 161.6 lb

## 2024-08-13 DIAGNOSIS — I951 Orthostatic hypotension: Secondary | ICD-10-CM

## 2024-08-13 DIAGNOSIS — I2510 Atherosclerotic heart disease of native coronary artery without angina pectoris: Secondary | ICD-10-CM

## 2024-08-13 DIAGNOSIS — E785 Hyperlipidemia, unspecified: Secondary | ICD-10-CM

## 2024-08-13 DIAGNOSIS — R55 Syncope and collapse: Secondary | ICD-10-CM

## 2024-08-13 DIAGNOSIS — I519 Heart disease, unspecified: Secondary | ICD-10-CM

## 2024-08-13 DIAGNOSIS — R0609 Other forms of dyspnea: Secondary | ICD-10-CM | POA: Diagnosis not present

## 2024-08-13 DIAGNOSIS — R002 Palpitations: Secondary | ICD-10-CM

## 2024-08-13 NOTE — Progress Notes (Signed)
 Cardiology Office Note:    Date:  08/13/2024  ID:  Merlynn Randal Minerva, DOB Feb 25, 1951, MRN [id number] PCP: Teresa Channel, MD  [facility] Providers Cardiologist:  Wilbert Bihari, MD       Patient Profile:       Chief Complaint: Follow-up orthostatic hypotension History of Present Illness:  Shalinda Burkholder is a 73 y.o. 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   She was seen by Dr. Bihari on 02/2024.  Chronic dyspnea exertion was unchanged.  Patient reported dizziness and presyncope.  She felt like the room was spinning and felt off balance.  She had positive orthostatics in office and her lisinopril  was discontinued.  ZIO monitor revealed brief PAT, rare PVCs and PACs. She was seen in clinic on 04/10/2024 by Artist Pouch, PA. Patient experienced dizziness described as room spinning sensation, exacerbated by head movements. Patient reported that despite discontinuation of lisinopril  her dizziness persisted. Vestibular physical therapy was recommended. Patient's metoprolol  succinate was increased to 37.5 mg daily.   Echocardiogram 04/2024 indicated LVEF 45 to 50%, LV internal cavity size mildly dilated, grade 1 DD, RV function size normal, no valvular abnormalities.  No significant change since prior studies.  She was last seen in clinic on 07/11/2024.  She reported to have had 2 syncopal episodes 2 weeks prior to office visit.  She reports she became extremely dizzy and lightheaded and then proceeded to blackout and fall.  This was witnessed and there was no seizure-like activity noted.  Her Imdur  was decreased to 30 mg daily.  She had attended vestibular therapy and was told she did not have vertigo.  Her orthostatics in  clinic showed a significant drop in blood pressure as she did report increased dizziness and lightheadedness with this.  Cardiac MRI was ordered given syncope, mildly reduced EF and history of palpitations and is currently pending scheduled for 9/25   Discussed the use of AI scribe software for clinical note transcription with the patient, who gave verbal consent to proceed.  History of Present Illness Jacquette Canales Linden Nazariah Cadet is a 73 year old female with orthostatic hypotension who presents with syncopal episodes.   Dizziness and lightheadedness have been present for about a year, worsening in the last month or two, leading to three syncopal episodes. The most recent episode occurred two weeks ago during a transition from sitting to standing, resulting in a fall.  She was sitting on the toilet and then stood up to take a shower and she fell while getting into the shower.  She had no improvement in reducing Imdur  from 60 to 30 mg.  She reported that she did attend vestibular rehab and was told she does not have vertigo.  She does continue to have chronic dyspnea on exertion and is working to get scheduled with the pulmonologist for further evaluation.  She reports her dyspnea is unchanged.  She denied any dyspnea or chest pains prior to the syncopal event.  She reports her palpitations have entirely resolved on increased dose of metoprolol .  She denies any chest pains, lower extremity edema, orthopnea, PND, melena, or hematochezia.  She tells me she stays hydrated drinking several bottles of water daily   Review of systems:  Please see the history of  present illness. All other systems are reviewed and otherwise negative.      Studies Reviewed:        Echocardiogram 04/17/2024 1. Left ventricular ejection fraction, by estimation, is 45 to 50%. The  left ventricle has mildly decreased function. The left ventricle  demonstrates global hypokinesis. The left ventricular internal cavity size   was mildly dilated. Left ventricular  diastolic parameters are consistent with Grade I diastolic dysfunction  (impaired relaxation).   2. Right ventricular systolic function is normal. The right ventricular  size is normal.   3. The mitral valve is normal in structure. No evidence of mitral valve  regurgitation. No evidence of mitral stenosis.   4. The aortic valve is calcified. There is mild calcification of the  aortic valve. There is mild thickening of the aortic valve. Aortic valve  regurgitation is not visualized. Aortic valve sclerosis is present, with  no evidence of aortic valve stenosis.   5. The inferior vena cava is normal in size with greater than 50%  respiratory variability, suggesting right atrial pressure of 3 mmHg.   ZIO 02/20/2024   Predominant rhythm was normal sinus rhythm with an average heart rate of 63 bpm and ranged from 41 to 125 bpm   2 episodes of SVT consistent with atrial tachycardia with the longest run lasting 6 beats and the fastest run at 146 bpm   1 episode of wide-complex tachycardia lasting 6 beats consistent with nonsustained ventricular tachycardia   Rare PACs, atrial couplets   Rare PVCs and ventricular couplets as well as ventricular bigeminy     Patch Wear Time:  13 days and 23 hours (2025-04-01T10:51:48-0400 to 2025-04-15T10:23:23-0400)   Patient had a min HR of 41 bpm, max HR of 146 bpm, and avg HR of 63 bpm. Predominant underlying rhythm was Sinus Rhythm. 1 run of Ventricular Tachycardia occurred lasting 6 beats with a max rate of 106 bpm (avg 102 bpm). 2 Supraventricular Tachycardia  runs occurred, the run with the fastest interval lasting 4 beats with a max rate of 146 bpm, the longest lasting 6 beats with an avg rate of 111 bpm. Isolated SVEs were rare (<1.0%), SVE Couplets were rare (<1.0%), and no SVE Triplets were present.  Isolated VEs were rare (<1.0%), VE Couplets were rare (<1.0%), and no VE Triplets were present. Ventricular Bigeminy  was present.   ZIO 06/21/2023   Predominant rhythm was normal sinus rhythm with an average heart rate of 79 bpm and ranged from 5154 bpm.   Several episodes of nonsustained atrial tachycardia with longest interval lasting 9 beats at a maximal heart rate of 154 bpm.   Rare PACs, atrial couplets and atrial triplets.   Rare PVCs, ventricular couplets and ventricular bigeminy and trigeminy     Patch Wear Time:  13 days and 23 hours (2024-06-25T12:38:59-0400 to 2024-07-09T12:38:55-0400)   Patient had a min HR of 50 bpm, max HR of 154 bpm, and avg HR of 79 bpm. Predominant underlying rhythm was Sinus Rhythm. 7 Supraventricular Tachycardia runs occurred, the run with the fastest interval lasting 5 beats with a max rate of 154 bpm, the  longest lasting 9 beats with an avg rate of 95 bpm. Isolated SVEs were rare (<1.0%), SVE Couplets were rare (<1.0%), and SVE Triplets were rare (<1.0%). Isolated VEs were rare (<1.0%), VE Couplets were rare (<1.0%), and no VE Triplets were present.  Ventricular Bigeminy and Trigeminy were present.   Coronary CTA 05/03/2023 1. Coronary calcium  score of 322. This was  89 percentile for age and sex matched control.   2. 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   3. CAD-RADS 3. Moderate stenosis. Consider symptom-guided anti-ischemic pharmacotherapy as well as risk factor modification per guideline directed care. Additional analysis with CT FFR will be submitted.  Echocardiogram 12/11/2022  1. Left ventricular ejection fraction, by estimation, is 50 to 55%. The  left ventricle has low normal function. Left ventricular endocardial  border not optimally defined to evaluate regional wall motion. Left  ventricular diastolic parameters are  consistent with Grade I diastolic dysfunction (impaired relaxation).   2. Right ventricular systolic function was not well visualized. The right  ventricular size is normal. Tricuspid  regurgitation signal is inadequate  for assessing PA pressure.   3. The mitral valve is degenerative. No evidence of mitral valve  regurgitation. No evidence of mitral stenosis.   4. The aortic valve is tricuspid. There is mild calcification of the  aortic valve. Aortic valve regurgitation is not visualized. No aortic  stenosis is present.   Echocardiogram 08/30/2022  1. Left ventricular ejection fraction, by estimation, is 45 to 50%. The  left ventricle has mildly decreased function. The left ventricle  demonstrates global hypokinesis. There is mild left ventricular  hypertrophy. Left ventricular diastolic parameters  are consistent with Grade I diastolic dysfunction (impaired relaxation).  The average left ventricular global longitudinal strain is -16.4 %. The  global longitudinal strain is abnormal.   2. Right ventricular systolic function is normal. The right ventricular  size is normal. There is normal pulmonary artery systolic pressure. The  estimated right ventricular systolic pressure is 26.6 mmHg.   3. The mitral valve is normal in structure. No evidence of mitral valve  regurgitation. No evidence of mitral stenosis.   4. The aortic valve is tricuspid. There is mild calcification of the  aortic valve. There is mild thickening of the aortic valve. Aortic valve  regurgitation is not visualized. Aortic valve sclerosis is present, with  no evidence of aortic valve stenosis.   5. The inferior vena cava is normal in size with greater than 50%  respiratory variability, suggesting right atrial pressure of 3 mmHg.   Lexiscan  Myoview  09/16/2020 <52mm ST depression at rest, mild worsening with stress Nuclear stress EF: 55%. The left ventricular ejection fraction is normal (55-65%). The study is normal. This is a low risk study.  Coronary CTA 11/07/2019 1. Coronary calcium  score of 146. This was 61 percentile for age and sex matched control.   2. 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 Right dominance.   3.  Mild non obstructive calcified plaque in the mid LAD   CAD-RADS 2. Mild non-obstructive CAD (25-49%). Consider non-atherosclerotic causes of chest pain. Consider preventive therapy and risk factor modification. Risk Assessment/Calculations:     HYPERTENSION CONTROL Vitals:   08/13/24 1505 08/13/24 1605  BP: (!) 153/92 (!) 150/88    The patient's blood pressure is elevated above target today.  In order to address the patient's elevated BP: Blood pressure will be monitored at home to determine if medication changes need to be made.           Physical Exam:   VS:  BP (!) 150/88 (BP Location: Right Arm, Patient Position: Sitting, Cuff Size: Normal)   Pulse 68   Ht 5' 3 (1.6 m)   Wt 161 lb 9.6  oz (73.3 kg)   SpO2 95%   BMI 28.63 kg/m    Wt Readings from Last 3 Encounters:  08/13/24 161 lb 9.6 oz (73.3 kg)  07/11/24 158 lb (71.7 kg)  04/10/24 157 lb 3.2 oz (71.3 kg)    GEN: Well nourished, well developed in no acute distress NECK: No JVD; No carotid bruits CARDIAC: RRR, no murmurs, rubs, gallops RESPIRATORY:  Clear to auscultation without rales, wheezing or rhonchi  ABDOMEN: Soft, non-tender, non-distended EXTREMITIES:  No edema; No acute deformity      Assessment and Plan:  Orthostatic hypotension Syncope Patient reports dizziness/lightheadedness has been present for about a year.  Worsened over the past 3 to 4 months.  Over the past 2 to 3 months she has had 3 syncopal episodes all associated with positional changes going from sitting to standing.  She did attend vestibular therapy and was told that she did not have vertigo.  During her last office visit on 8/7 her orthostatics showed a significant drop in blood pressure with reported dizziness and lightheadedness.  From 173/97 lying, 131/89 sitting, to 109/71 standing Zio 02/2024  without arrhythmia - She did not see major improvement when decreasing her isosorbide  - Encouraged slow position changes, compression socks, and abdominal binder - Her recent lab work including cortisol, CBC, BMET, MAG, and TSH were all reassuring - Plan to discontinue isosorbide  30 mg today.  Will continue metoprolol  at 37.5 mg daily given significant improvement in her palpitations - I will have her increase her fluid intake as well as salt intake (will need to monitor her blood pressure).  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  She denies any exertional chest pains.  Chronic DOE unchanged.  Was no indication for further ischemic evaluation at this time - Discontinue Imdur  as noted above in the setting of orthostatic hypotension - She does have large hiatal hernia with GERD that could be her prior etiology of chest pains. Imdur  may have been helping with any esophageal spasms - Continue aspirin  81 mg daily, atorvastatin  80 mg daily, ezetimibe  10 mg daily, metoprolol  XL 37.5 mg daily  Dyspnea on exertion She has a long history of chronic dyspnea on exertion for several years.  She denies any acute changes or any recent exacerbations. - She was recently referred to pulmonology and has yet to establish - Does have exposure to secondhand smoke daily  LV dysfunction Echocardiogram 04/2024 indicated LVEF of 45 to 50%, grade 1 DD, RV function normal Echocardiogram 12/2022 with LVEF 50 to 55% - She has been unable to tolerate Jardiance or Lasix  in the past due to orthostatic hypotension - Today she appears euvolemic and well compensated on exam.  Volume status is stable - She is pending  cardiac MRI  Palpitations Previous ZIO showed PAT, PVCs, and PVCs Quiescent.  Denies palpitations since recent adjustment in metoprolol  - Continue metoprolol  XL 37.5 mg daily  Hyperlipidemia LDL 45 on 09/2023 and well-controlled - Continue atorvastatin  80 mg daily and ezetimibe  10 mg      Dispo:  Return in about 5 weeks (around 09/17/2024) for Turner or APP.  Signed, Lum LITTIE Louis, NP

## 2024-08-13 NOTE — Patient Instructions (Addendum)
 Medication Instructions:  Stop Imdur  *If you need a refill on your cardiac medications before your next appointment, please call your pharmacy*   Follow-Up: At [facility], you and your health needs are our priority.  As part of our continuing mission to provide you with exceptional heart care, our providers are all part of one team.  This team includes your primary Cardiologist (physician) and Advanced Practice Providers or APPs (Physician Assistants and Nurse Practitioners) who all work together to provide you with the care you need, when you need it.  Your next appointment:   4-6 week(s)  Provider:   Wilbert Bihari, MD or One of our Advanced Practice Providers (APPs): Morse Clause, PA-C  Lamarr Satterfield, NP Miriam Shams, NP  Olivia Pavy, PA-C Josefa Beauvais, NP  Leontine Salen, PA-C Orren Fabry, PA-C  Victor, PA-C Ernest Dick, NP  Damien Braver, NP Jon Hails, PA-C  Waddell Donath, PA-C    Dayna Dunn, PA-C  Glendia Ferrier, PA-C Lum Louis, NP Katlyn West, NP Callie Goodrich, PA-C  Evan Williams, PA-C Sheng Haley, PA-C  Xika Zhao, NP Kathleen Johnson, PA-C       Other Instructions Increase salt intake

## 2024-08-28 ENCOUNTER — Encounter (HOSPITAL_COMMUNITY): Payer: Self-pay

## 2024-08-29 ENCOUNTER — Ambulatory Visit (HOSPITAL_COMMUNITY)
Admission: RE | Admit: 2024-08-29 | Discharge: 2024-08-29 | Disposition: A | Discharge status: Home / Self Care | Source: Ambulatory Visit | Attending: Cardiology | Admitting: Cardiology

## 2024-08-29 ENCOUNTER — Other Ambulatory Visit: Payer: Self-pay | Admitting: Cardiology

## 2024-08-29 DIAGNOSIS — R55 Syncope and collapse: Secondary | ICD-10-CM

## 2024-08-29 DIAGNOSIS — R42 Dizziness and giddiness: Secondary | ICD-10-CM

## 2024-08-29 MED ORDER — GADOBUTROL 1 MMOL/ML IV SOLN
10.0000 mL | Freq: Once | INTRAVENOUS | Status: AC | PRN
Start: 2024-08-29 — End: 2024-08-29
  Administered 2024-08-29: 10 mL via INTRAVENOUS

## 2024-09-03 DIAGNOSIS — E785 Hyperlipidemia, unspecified: Secondary | ICD-10-CM | POA: Diagnosis not present

## 2024-09-03 DIAGNOSIS — I5040 Unspecified combined systolic (congestive) and diastolic (congestive) heart failure: Secondary | ICD-10-CM | POA: Diagnosis not present

## 2024-09-03 DIAGNOSIS — I10 Essential (primary) hypertension: Secondary | ICD-10-CM | POA: Diagnosis not present

## 2024-09-03 DIAGNOSIS — F324 Major depressive disorder, single episode, in partial remission: Secondary | ICD-10-CM | POA: Diagnosis not present

## 2024-09-09 ENCOUNTER — Other Ambulatory Visit: Payer: Self-pay | Admitting: Cardiology

## 2024-09-11 ENCOUNTER — Ambulatory Visit: Admitting: Pulmonary Disease

## 2024-09-11 ENCOUNTER — Ambulatory Visit: Admitting: Emergency Medicine

## 2024-09-11 ENCOUNTER — Encounter: Payer: Self-pay | Admitting: Pulmonary Disease

## 2024-09-11 VITALS — BP 112/72 | HR 68 | Temp 97.6°F | Ht 63.0 in | Wt 163.0 lb

## 2024-09-11 DIAGNOSIS — Z77120 Contact with and (suspected) exposure to mold (toxic): Secondary | ICD-10-CM

## 2024-09-11 DIAGNOSIS — I951 Orthostatic hypotension: Secondary | ICD-10-CM | POA: Diagnosis not present

## 2024-09-11 DIAGNOSIS — J849 Interstitial pulmonary disease, unspecified: Secondary | ICD-10-CM

## 2024-09-11 DIAGNOSIS — K219 Gastro-esophageal reflux disease without esophagitis: Secondary | ICD-10-CM

## 2024-09-11 DIAGNOSIS — K449 Diaphragmatic hernia without obstruction or gangrene: Secondary | ICD-10-CM

## 2024-09-11 DIAGNOSIS — G4733 Obstructive sleep apnea (adult) (pediatric): Secondary | ICD-10-CM | POA: Diagnosis not present

## 2024-09-11 DIAGNOSIS — R0602 Shortness of breath: Secondary | ICD-10-CM | POA: Diagnosis not present

## 2024-09-11 NOTE — Progress Notes (Signed)
 Debbie [name]    [id number]    Sep 07, 1951  Primary Care Physician:White, Montie, MD  Referring Physician: West, Katlyn D, NP [address] [place],  [place] [address]  Chief complaint: Consult for dyspnea, dizziness  HPI: 73 y.o. who  has a past medical history of Acid reflux, ALLERGIC RHINITIS, Anxiety, CAD (coronary artery disease), native coronary artery, Depression, Diverticulosis, Dyspnea, Esophageal stricture, Hiatal hernia, History of kidney stones, Hyperlipidemia, Hypertension, IBS (irritable bowel syndrome), Orthostatic hypotension, Pre-diabetes, and Sleep apnea.  Discussed the use of AI scribe software for clinical note transcription with the patient, who gave verbal consent to proceed.  History of Present Illness Debbie [name] Debbie [name] Debbie [name] is a 73 year old female with coronary artery disease who presents with dizziness and breathing problems. She is accompanied by her sister, Debbie [name]. She was referred by her heart doctor for evaluation of her dizziness and breathing problems.  Dyspnea and exertional intolerance - Progressive dyspnea over several years, worsening with time - Breathlessness occurs with minimal exertion, such as carrying groceries or singing - Unable to sing more than one or two stanzas before becoming breathless - Breathlessness also occurs at rest, including when getting up at night - Gasps for breath when lying down - Frequent involuntary deep breaths during conversations - Has seasonal and dog allergies  Dizziness and falls - Dizziness has progressively worsened over several years - Dizziness now severe enough to cause falls  Sleep-disordered breathing - Diagnosed with sleep apnea 5-6 years ago - Approximately 10 episodes of breath loss per hour - Does not use CPAP due to insurance issues  Relevant Pulmonary history: Pets: Dog Occupation: Used to work in Clinical biochemist Exposures:Lived in a house with a leaky roof  and visible mold for at least one year.  She is preparing to move out of this house in 1 month.  No hot tub, Jacuzzi.  No feather pillows or comforter No h/o chemo/XRT/amiodarone/macrodantin/MTX  No exposure to asbestos, silica or other organic allergens  Smoking history: Never smoker .Exposure to secondhand smoke from husband Travel history: No significant travel history Family history:Maternal grandfather had COPD and lung cancer secondary to smoking  Outpatient Encounter Medications as of 09/11/2024  Medication Sig   aspirin  EC 81 MG tablet Take 1 tablet (81 mg total) by mouth daily.   cetirizine (ZYRTEC) 10 MG tablet Take 10 mg by mouth at bedtime.   CYMBALTA  60 MG capsule Take 120 mg by mouth at bedtime.   diphenhydrAMINE (BENADRYL) 25 MG tablet Take 25 mg by mouth daily as needed for allergies.   ezetimibe  (ZETIA ) 10 MG tablet Take 1 tablet (10 mg total) by mouth daily.   fluticasone  (FLONASE ) 50 MCG/ACT nasal spray Place 2 sprays into both nostrils daily as needed for allergies or rhinitis.    ibuprofen  (ADVIL ) 200 MG tablet Take 400 mg by mouth every 6 (six) hours as needed for headache.   metoprolol  succinate (TOPROL -XL) 25 MG 24 hr tablet TAKE 1 AND 1/2 TABLETS EVERY DAY   Multiple Vitamins-Minerals (ADULT GUMMY PO) Take 1 each by mouth daily.   pantoprazole  (PROTONIX ) 40 MG tablet Take 40 mg by mouth 2 (two) times daily.   triamcinolone  ointment (KENALOG ) 0.1 % Apply 1 Application topically 2 (two) times daily as needed (rash).   Vitamin D -Vitamin K (VITAMIN K2-VITAMIN D3 PO) Take 1 tablet by mouth daily.   atorvastatin  (LIPITOR ) 80 MG tablet Take 80 mg by mouth daily. (Patient not taking:  Reported on 09/11/2024)   loperamide (IMODIUM A-D) 2 MG tablet Take 4 mg by mouth 4 (four) times daily as needed for diarrhea or loose stools. (Patient not taking: Reported on 09/11/2024)   melatonin 5 MG TABS Take 5 mg by mouth at bedtime as needed. (Patient not taking: Reported on 09/11/2024)    [DISCONTINUED] metoprolol  succinate (TOPROL  XL) 25 MG 24 hr tablet Take 1.5 tablets (37.5 mg total) by mouth daily.   No facility-administered encounter medications on file as of 09/11/2024.     Physical Exam: Today's Vitals   09/11/24 1254  BP: 112/72  Pulse: 68  Temp: 97.6 F (36.4 C)  TempSrc: Oral  SpO2: 99%  Weight: 163 lb (73.9 kg)  Height: 5' 3 (1.6 m)   Body mass index is 28.87 kg/m.  Physical Exam GEN: No acute distress. CV: Regular rate and rhythm, no murmurs. LUNGS: Clear to auscultation bilaterally, normal respiratory effort. SKIN JOINTS: Warm and dry, no rash.    Data Reviewed: Imaging: CT chest 09/14/2020-moderate hiatal hernia, bandlike scarring/atelectasis, mild bronchiectasis, minimal ground glass Chest x-ray 12/14/2022-atelectasis at the base  CT abdomen 11/24/2022-visualized lungs base appear clear CT coronary 05/08/2023-visualized lungs show hiatal hernia, minimal ground glass changes I have reviewed the images personally.  PFTs:  Labs:  Assessment and Plan Assessment & Plan Exertional dyspnea and shortness of breath Chronic exertional dyspnea and shortness of breath worsening over the past few years. Symptoms include breathlessness after minimal exertion such as carrying groceries or singing. Differential diagnosis includes cardiac causes, deconditioning, and potential lung issues. Previous scans show mild bronchiectasis and minimal lung inflammation. No history of asthma or COPD. Potential contribution from GERD and hiatal hernia. Consideration of mold exposure as a contributing factor. - Order chest CT and lung function test   Exposure to household mold Exposure to household mold due to a leaky roof and visible mold on walls. Risk for hypersensitivity pneumonitis considered. Symptoms may include lung inflammation due to allergic reaction to mold. Moving to a new residence is planned, which will reduce exposure. - Advise moving to a new residence  to avoid mold exposure - Consider corticosteroids if lung inflammation is confirmed  Orthostatic hypotension with syncope Cardiac arrhythmia (PVCs) and coronary artery disease Dizziness and syncope associated with postural changes. Diagnosed with orthostatic hypotension. Symptoms include dizziness upon standing and occasional falls. Follows with cardiology  Hiatal hernia with gastroesophageal reflux disease (GERD) Large hiatal hernia with previous surgical repair. Persistent moderate hernia noted on recent scans. GERD managed with pantoprazole  twice daily, effectively controlling symptoms. Potential contribution to lung symptoms through aspiration or reflux.  Obstructive sleep apnea Diagnosed with obstructive sleep apnea approximately 5-6 years ago. CPAP therapy recommended but not utilized due to insurance coverage issues. Severity of apnea not clearly documented but suggested to be mild to moderate based on reported symptoms.   Recommendations: HRCT, PFTs  I personally spent a total of 45 minutes in the care of the patient today including preparing to see the patient, getting/reviewing separately obtained history, performing a medically appropriate exam/evaluation, counseling and educating, placing orders, and independently interpreting results.   Lonna Coder MD [facility] 09/11/2024, 1:06 PM  CC: West, Katlyn D, NP

## 2024-09-11 NOTE — Patient Instructions (Addendum)
  VISIT SUMMARY: Today, we discussed your ongoing issues with dizziness, breathing problems, and other related health concerns. We have planned several tests and changes to your living environment to help manage your symptoms.  YOUR PLAN: EXERTIONAL DYSPNEA AND SHORTNESS OF BREATH: You have been experiencing worsening breathlessness over the past few years, even with minimal exertion. -We will order a chest CT and lung function test to better understand the cause of your symptoms.  MILD BRONCHIECTASIS AND LUNG INFLAMMATION: Previous scans show mild bronchiectasis and minimal lung inflammation, possibly due to GERD and mold exposure. -We will consider corticosteroids if lung inflammation is confirmed.  EXPOSURE TO HOUSEHOLD MOLD: You have been exposed to mold in your home, which may be contributing to your lung symptoms. -It is advised to move to a new residence to avoid mold exposure.  ORTHOSTATIC HYPOTENSION WITH SYNCOPE: You have dizziness and occasional falls due to a drop in blood pressure when standing up. -We will continue to monitor and manage your symptoms.  CARDIAC ARRHYTHMIA (PVCS) AND CORONARY ARTERY DISEASE: You have coronary artery disease and irregular heartbeats known as PVCs. -We will continue to monitor your heart condition.  HIATAL HERNIA WITH GASTROESOPHAGEAL REFLUX DISEASE (GERD): You have a hiatal hernia and GERD, which are being managed with medication. -Continue taking pantoprazole  twice daily to manage your GERD symptoms.  OBSTRUCTIVE SLEEP APNEA: You have sleep apnea but have not been using CPAP therapy due to insurance issues. - We can reassess at return visit  ALLERGIC RHINITIS (SEASONAL AND DOG ALLERGY ): You have seasonal and dog allergies. -Continue managing your allergies as previously advised.

## 2024-09-23 ENCOUNTER — Encounter: Payer: Self-pay | Admitting: Pulmonary Disease

## 2024-09-26 ENCOUNTER — Ambulatory Visit
Admission: RE | Admit: 2024-09-26 | Discharge: 2024-09-26 | Disposition: A | Discharge status: Home / Self Care | Source: Ambulatory Visit | Attending: Pulmonary Disease | Admitting: Pulmonary Disease

## 2024-09-26 DIAGNOSIS — R0602 Shortness of breath: Secondary | ICD-10-CM

## 2024-09-26 DIAGNOSIS — J479 Bronchiectasis, uncomplicated: Secondary | ICD-10-CM | POA: Diagnosis not present

## 2024-10-01 DIAGNOSIS — J019 Acute sinusitis, unspecified: Secondary | ICD-10-CM | POA: Diagnosis not present

## 2024-10-04 DIAGNOSIS — E785 Hyperlipidemia, unspecified: Secondary | ICD-10-CM | POA: Diagnosis not present

## 2024-10-04 DIAGNOSIS — F324 Major depressive disorder, single episode, in partial remission: Secondary | ICD-10-CM | POA: Diagnosis not present

## 2024-10-04 DIAGNOSIS — I5040 Unspecified combined systolic (congestive) and diastolic (congestive) heart failure: Secondary | ICD-10-CM | POA: Diagnosis not present

## 2024-10-04 DIAGNOSIS — I10 Essential (primary) hypertension: Secondary | ICD-10-CM | POA: Diagnosis not present

## 2024-10-07 ENCOUNTER — Encounter: Payer: Self-pay | Admitting: *Deleted

## 2024-10-10 ENCOUNTER — Ambulatory Visit: Admitting: Emergency Medicine

## 2024-10-11 ENCOUNTER — Other Ambulatory Visit: Payer: Self-pay | Admitting: Medical Genetics

## 2024-10-16 ENCOUNTER — Ambulatory Visit: Payer: Self-pay | Admitting: Pulmonary Disease

## 2024-10-17 DIAGNOSIS — I10 Essential (primary) hypertension: Secondary | ICD-10-CM | POA: Diagnosis not present

## 2024-10-17 DIAGNOSIS — E559 Vitamin D deficiency, unspecified: Secondary | ICD-10-CM | POA: Diagnosis not present

## 2024-10-17 DIAGNOSIS — Z23 Encounter for immunization: Secondary | ICD-10-CM | POA: Diagnosis not present

## 2024-10-17 DIAGNOSIS — E785 Hyperlipidemia, unspecified: Secondary | ICD-10-CM | POA: Diagnosis not present

## 2024-10-17 DIAGNOSIS — G4733 Obstructive sleep apnea (adult) (pediatric): Secondary | ICD-10-CM | POA: Diagnosis not present

## 2024-10-17 DIAGNOSIS — Z0000 Encounter for general adult medical examination without abnormal findings: Secondary | ICD-10-CM | POA: Diagnosis not present

## 2024-10-17 DIAGNOSIS — R7303 Prediabetes: Secondary | ICD-10-CM | POA: Diagnosis not present

## 2024-10-17 DIAGNOSIS — I5040 Unspecified combined systolic (congestive) and diastolic (congestive) heart failure: Secondary | ICD-10-CM | POA: Diagnosis not present

## 2024-10-17 DIAGNOSIS — F324 Major depressive disorder, single episode, in partial remission: Secondary | ICD-10-CM | POA: Diagnosis not present

## 2024-10-17 DIAGNOSIS — Z79899 Other long term (current) drug therapy: Secondary | ICD-10-CM | POA: Diagnosis not present

## 2024-10-17 DIAGNOSIS — F419 Anxiety disorder, unspecified: Secondary | ICD-10-CM | POA: Diagnosis not present

## 2024-10-17 DIAGNOSIS — I700 Atherosclerosis of aorta: Secondary | ICD-10-CM | POA: Diagnosis not present

## 2024-10-18 ENCOUNTER — Other Ambulatory Visit (HOSPITAL_BASED_OUTPATIENT_CLINIC_OR_DEPARTMENT_OTHER): Payer: Self-pay | Admitting: Family Medicine

## 2024-10-18 DIAGNOSIS — E2839 Other primary ovarian failure: Secondary | ICD-10-CM

## 2024-10-28 ENCOUNTER — Encounter: Payer: Self-pay | Admitting: *Deleted

## 2024-10-29 ENCOUNTER — Encounter: Payer: Self-pay | Admitting: Emergency Medicine

## 2024-10-29 ENCOUNTER — Ambulatory Visit: Attending: Emergency Medicine | Admitting: Emergency Medicine

## 2024-10-29 VITALS — BP 116/84 | HR 72 | Resp 97 | Ht 63.0 in | Wt 160.0 lb

## 2024-10-29 DIAGNOSIS — R55 Syncope and collapse: Secondary | ICD-10-CM

## 2024-10-29 DIAGNOSIS — I2510 Atherosclerotic heart disease of native coronary artery without angina pectoris: Secondary | ICD-10-CM | POA: Diagnosis not present

## 2024-10-29 DIAGNOSIS — E785 Hyperlipidemia, unspecified: Secondary | ICD-10-CM

## 2024-10-29 DIAGNOSIS — I5022 Chronic systolic (congestive) heart failure: Secondary | ICD-10-CM

## 2024-10-29 DIAGNOSIS — R002 Palpitations: Secondary | ICD-10-CM | POA: Diagnosis not present

## 2024-10-29 DIAGNOSIS — R0609 Other forms of dyspnea: Secondary | ICD-10-CM

## 2024-10-29 DIAGNOSIS — I428 Other cardiomyopathies: Secondary | ICD-10-CM

## 2024-10-29 DIAGNOSIS — I951 Orthostatic hypotension: Secondary | ICD-10-CM

## 2024-10-29 LAB — COMPREHENSIVE METABOLIC PANEL WITH GFR
ALT: 21 IU/L (ref 0–32)
AST: 29 IU/L (ref 0–40)
Albumin: 3.9 g/dL (ref 3.8–4.8)
Alkaline Phosphatase: 83 IU/L (ref 49–135)
BUN/Creatinine Ratio: 19 (ref 12–28)
BUN: 13 mg/dL (ref 8–27)
Bilirubin Total: 0.7 mg/dL (ref 0.0–1.2)
CO2: 28 mmol/L (ref 20–29)
Calcium: 9 mg/dL (ref 8.7–10.3)
Chloride: 104 mmol/L (ref 96–106)
Creatinine, Ser: 0.7 mg/dL (ref 0.57–1.00)
Globulin, Total: 2.3 g/dL (ref 1.5–4.5)
Glucose: 107 mg/dL — ABNORMAL HIGH (ref 70–99)
Potassium: 4.5 mmol/L (ref 3.5–5.2)
Sodium: 146 mmol/L — ABNORMAL HIGH (ref 134–144)
Total Protein: 6.2 g/dL (ref 6.0–8.5)
eGFR: 92 mL/min/1.73 (ref >59)

## 2024-10-29 LAB — LIPID PANEL
Chol/HDL Ratio: 1.9 ratio (ref 0.0–4.4)
Cholesterol, Total: 113 mg/dL (ref 100–199)
HDL: 59 mg/dL (ref >39)
LDL Chol Calc (NIH): 35 mg/dL (ref 0–99)
Triglycerides: 107 mg/dL (ref 0–149)
VLDL Cholesterol Cal: 19 mg/dL (ref 5–40)

## 2024-10-29 NOTE — Patient Instructions (Addendum)
 Medication Instructions:  NO CHANGES  Lab Work: FASTING LIPID PANEL AND CMET TOP BE DONE TODAY.  Testing/Procedures: NONE  Follow-Up: At [facility], you and your health needs are our priority.  As part of our continuing mission to provide you with exceptional heart care, our providers are all part of one team.  This team includes your primary Cardiologist (physician) and Advanced Practice Providers or APPs (Physician Assistants and Nurse Practitioners) who all work together to provide you with the care you need, when you need it.  Your next appointment:   6 MONTHS  Provider:   Wilbert Bihari, MD OR Lum Louis, NP

## 2024-10-29 NOTE — Progress Notes (Signed)
 Cardiology Office Note:    Date:  10/29/2024  ID:  Debbie [name], DOB 1951/05/12, MRN [id number] PCP: Teresa Channel, MD  [facility] Providers Cardiologist:  Wilbert Bihari, MD Cardiology APP:  Rana Lum CROME, NP       Patient Profile:       Chief Complaint: 6-week follow-up History of Present Illness:  Debbie [name] is a 73 y.o. 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    She was seen by Dr. Bihari on 02/2024.  Chronic dyspnea exertion was unchanged.  Patient reported dizziness and presyncope.  She felt like the room was spinning and felt off balance.  She had positive orthostatics in office and her lisinopril  was discontinued.   ZIO monitor revealed brief PAT, rare PVCs and PACs. She was seen in clinic on 04/10/2024 by Artist Pouch, PA. Patient experienced dizziness described as room spinning sensation, exacerbated by head movements. Patient reported that despite discontinuation of lisinopril  her dizziness persisted. Vestibular physical therapy was recommended. Patient's metoprolol  succinate was increased to 37.5 mg daily.    Echocardiogram 04/2024 indicated LVEF 45 to 50%, LV internal cavity size mildly dilated, grade 1 DD, RV function size normal, no valvular abnormalities.  No significant change since prior studies.   Seen on 07/11/2024.  She reported to have had 2 syncopal episodes 2 weeks prior to office visit.  She reports she became extremely dizzy and lightheaded and then proceeded to blackout and fall.  This was witnessed and there was no seizure-like activity noted.  Her Imdur  was decreased to 30 mg daily.  She had  attended vestibular therapy and was told she did not have vertigo.  Her orthostatics in clinic showed a significant drop in blood pressure as she did report increased dizziness and lightheadedness with this.  Cardiac MRI was ordered given syncope, mildly reduced EF and history of palpitations and is currently pending scheduled for 9/25.  Was seen in clinic on 08/13/2024.  She has had no improvement since reducing Imdur .  She reported that she did attend vestibular rehab and told she did not have vertigo.  Her isosorbide  was subsequently discontinued.  She was to increase salt in her diet.  Cardiac MRI 08/2024 showed nonischemic cardiomyopathy with mildly reduced LV systolic function and global hypokinesis without evidence of infarct, scar, or infiltrative disease.  LVEF was calculated at 44% with late systolic paradoxical motion of the distal-apical septum of unclear clinical significance.   Discussed the use of AI scribe software for clinical note transcription with the patient, who gave verbal consent to proceed.  History of Present Illness Debbie [name] is a 73 year old female with coronary artery disease who presents for a 6-week follow-up for syncope and orthostatic hypotension.  Today she is doing exceptionally well without any acute cardiovascular concerns.  Since discontinuation of isosorbide  she has had no recurrent syncope, with only occasional lightheadedness.  She increases oral fluids and salt, including liquid IV and pink Himalayan salt, which she feels improves her energy.  She has coronary artery disease and takes aspirin , atorvastatin , Zetia , and metoprolol .  She denies any chest pains.  Her last lipids a year ago showed LDL 37, below the goal  of less than 70. She takes metoprolol  37.5 mg for palpitations, which are currently well controlled.  She has untreated mild to moderate sleep apnea and chronic shortness of breath and is followed by pulmonology. A recent  high-resolution CT was reported to her as improved compared with 2021. Pulmonary function tests and a pulmonology follow-up are planned to further evaluate her dyspnea.  She denies orthopnea, PND, syncope, presyncope, LEE   Review of systems:  Please see the history of present illness. All other systems are reviewed and otherwise negative.      Studies Reviewed:        Cardiac MRI 08/29/2024 1. There is mild left ventricular cavity dilation with normal wall thickness. There is global hypokinesis of the LV myocardium with late systolic paradoxical motion of the distal-apical septum of unclear clinical significance. The LV ejection fraction is calculated at 44%.   2. The right ventricle is normal in cavity size, wall thickness, and systolic function. There are no RV wall motion abnormalities or aneurysms.   3. Both atria are normal in size.   4. The aortic valve is trileaflet in morphology. There is no significant aortic valve stenosis or regurgitation. There is no significant valvular disease.   5. Delayed enhancement imaging demonstrates no evidence of myocardial infarction, scar or infiltrative disease.   6. Patient with known anomalous LM off the RCC. The origin and course of this anomalous artery are poorly visualized by this study.   7. There is a trivial pericardial effusion. The thickness of the pericardium is normal.   8. The proximal aorta is normal in size and caliber. The aorta appears tortuous.   9. No notable extracardiac findings.  Echocardiogram 04/17/2024 1. Left ventricular ejection fraction, by estimation, is 45 to 50%. The  left ventricle has mildly decreased function. The left ventricle  demonstrates global hypokinesis. The left ventricular internal cavity size  was mildly dilated. Left ventricular  diastolic parameters are consistent with Grade I diastolic dysfunction  (impaired relaxation).   2. Right ventricular systolic function is normal. The  right ventricular  size is normal.   3. The mitral valve is normal in structure. No evidence of mitral valve  regurgitation. No evidence of mitral stenosis.   4. The aortic valve is calcified. There is mild calcification of the  aortic valve. There is mild thickening of the aortic valve. Aortic valve  regurgitation is not visualized. Aortic valve sclerosis is present, with  no evidence of aortic valve stenosis.   5. The inferior vena cava is normal in size with greater than 50%  respiratory variability, suggesting right atrial pressure of 3 mmHg.    ZIO 02/20/2024   Predominant rhythm was normal sinus rhythm with an average heart rate of 63 bpm and ranged from 41 to 125 bpm   2 episodes of SVT consistent with atrial tachycardia with the longest run lasting 6 beats and the fastest run at 146 bpm   1 episode of wide-complex tachycardia lasting 6 beats consistent with nonsustained ventricular tachycardia   Rare PACs, atrial couplets   Rare PVCs and ventricular couplets as well as ventricular bigeminy     Patch Wear Time:  13 days and 23 hours (2025-04-01T10:51:48-0400 to 2025-04-15T10:23:23-0400)   Patient had a min HR of 41 bpm, max HR of 146 bpm, and avg HR of 63 bpm. Predominant underlying rhythm was Sinus Rhythm. 1 run of Ventricular Tachycardia occurred lasting 6 beats with a max rate of 106 bpm (avg 102 bpm). 2  Supraventricular Tachycardia  runs occurred, the run with the fastest interval lasting 4 beats with a max rate of 146 bpm, the longest lasting 6 beats with an avg rate of 111 bpm. Isolated SVEs were rare (<1.0%), SVE Couplets were rare (<1.0%), and no SVE Triplets were present.  Isolated VEs were rare (<1.0%), VE Couplets were rare (<1.0%), and no VE Triplets were present. Ventricular Bigeminy was present.    ZIO 06/21/2023   Predominant rhythm was normal sinus rhythm with an average heart rate of 79 bpm and ranged from 5154 bpm.   Several episodes of nonsustained atrial  tachycardia with longest interval lasting 9 beats at a maximal heart rate of 154 bpm.   Rare PACs, atrial couplets and atrial triplets.   Rare PVCs, ventricular couplets and ventricular bigeminy and trigeminy     Patch Wear Time:  13 days and 23 hours (2024-06-25T12:38:59-0400 to 2024-07-09T12:38:55-0400)   Patient had a min HR of 50 bpm, max HR of 154 bpm, and avg HR of 79 bpm. Predominant underlying rhythm was Sinus Rhythm. 7 Supraventricular Tachycardia runs occurred, the run with the fastest interval lasting 5 beats with a max rate of 154 bpm, the  longest lasting 9 beats with an avg rate of 95 bpm. Isolated SVEs were rare (<1.0%), SVE Couplets were rare (<1.0%), and SVE Triplets were rare (<1.0%). Isolated VEs were rare (<1.0%), VE Couplets were rare (<1.0%), and no VE Triplets were present.  Ventricular Bigeminy and Trigeminy were present.    Coronary CTA 05/03/2023 1. Coronary calcium  score of 322. This was 46 percentile for age and sex matched control.   2. 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   3. CAD-RADS 3. Moderate stenosis. Consider symptom-guided anti-ischemic pharmacotherapy as well as risk factor modification per guideline directed care. Additional analysis with CT FFR will be submitted.   Echocardiogram 12/11/2022  1. Left ventricular ejection fraction, by estimation, is 50 to 55%. The  left ventricle has low normal function. Left ventricular endocardial  border not optimally defined to evaluate regional wall motion. Left  ventricular diastolic parameters are  consistent with Grade I diastolic dysfunction (impaired relaxation).   2. Right ventricular systolic function was not well visualized. The right  ventricular size is normal. Tricuspid regurgitation signal is inadequate  for assessing PA pressure.   3. The mitral valve is degenerative. No evidence of mitral valve  regurgitation. No evidence of mitral stenosis.   4.  The aortic valve is tricuspid. There is mild calcification of the  aortic valve. Aortic valve regurgitation is not visualized. No aortic  stenosis is present.    Echocardiogram 08/30/2022  1. Left ventricular ejection fraction, by estimation, is 45 to 50%. The  left ventricle has mildly decreased function. The left ventricle  demonstrates global hypokinesis. There is mild left ventricular  hypertrophy. Left ventricular diastolic parameters  are consistent with Grade I diastolic dysfunction (impaired relaxation).  The average left ventricular global longitudinal strain is -16.4 %. The  global longitudinal strain is abnormal.   2. Right ventricular systolic function is normal. The right ventricular  size is normal. There is normal pulmonary artery systolic pressure. The  estimated right ventricular systolic pressure is 26.6 mmHg.   3. The mitral valve is normal in structure. No evidence of mitral valve  regurgitation. No evidence of mitral stenosis.   4. The aortic valve is tricuspid. There is mild calcification of the  aortic valve. There is mild thickening  of the aortic valve. Aortic valve  regurgitation is not visualized. Aortic valve sclerosis is present, with  no evidence of aortic valve stenosis.   5. The inferior vena cava is normal in size with greater than 50%  respiratory variability, suggesting right atrial pressure of 3 mmHg.    Lexiscan  Myoview  09/16/2020 <97mm ST depression at rest, mild worsening with stress Nuclear stress EF: 55%. The left ventricular ejection fraction is normal (55-65%). The study is normal. This is a low risk study.   Coronary CTA 11/07/2019 1. Coronary calcium  score of 146. This was 59 percentile for age and sex matched control.   2. 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 Right dominance.   3.  Mild non obstructive calcified plaque  in the mid LAD   CAD-RADS 2. Mild non-obstructive CAD (25-49%). Consider non-atherosclerotic causes of chest pain. Consider preventive therapy and risk factor modification.  Risk Assessment/Calculations:              Physical Exam:   VS:  BP 116/84 (BP Location: Left Arm, Patient Position: Sitting, Cuff Size: Normal)   Pulse 72   Resp (!) 97   Ht 5' 3 (1.6 m)   Wt 160 lb (72.6 kg)   BMI 28.34 kg/m    Wt Readings from Last 3 Encounters:  10/29/24 160 lb (72.6 kg)  09/11/24 163 lb (73.9 kg)  08/13/24 161 lb 9.6 oz (73.3 kg)    GEN: Well nourished, well developed in no acute distress NECK: No JVD; No carotid bruits CARDIAC: RRR, no murmurs, rubs, gallops RESPIRATORY:  Clear to auscultation without rales, wheezing or rhonchi  ABDOMEN: Soft, non-tender, non-distended EXTREMITIES:  No edema; No acute deformity      Assessment and Plan:  Orthostatic hypotension Syncope History of orthostatic hypotension and syncope associated with positional changes going from sitting to standing.  She did attend vestibular therapy and was told that she did not have vertigo.   Zio 02/2024 without arrhythmia - Syncope/lightheadedness has resolved since discontinuation of isosorbide .  She now takes liquid IV and adds pink Himalayan salt to her water.  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  She is without any symptoms concerning for active angina.  No indication further ischemic evaluation at this time - Isosorbide  discontinued in the past in the setting of  orthostatic hypotension - She does have large hiatal hernia with GERD that could be prior etiology of chest pains - Continue aspirin  81 mg daily, atorvastatin  80 mg daily, ezetimibe  10 mg daily, metoprolol  XL 37.5 mg daily   Dyspnea on exertion She has a long history of chronic dyspnea on exertion for several years.  She denies any acute changes or any recent exacerbations. - She is established with pulmonology and is awaiting PFTs   Nonischemic cardiomyopathy HFmrEF Echocardiogram 04/2024 indicated LVEF of 45 to 50%, grade 1 DD, RV function normal cMRI 08/2024 showed NICM with global hypokinesis of the LV myocardium with late systolic paradoxical motion of the distal-apical  septum of unclear clinical significance with LVEF 44%.  RV normal.  There was no evidence of myocardial infarction, scar, or infiltrative disease - She has been unable to tolerate lisinopril , spironolactone , or Lasix  in the past due to orthostatic hypotension - Unable to tolerate Farxiga  in the past due to yeast infection - Today she appears euvolemic on exam.  Volume status is stable - No dyspnea, orthopnea, PND, LEE - GDMT has been limited in the setting of orthostatic hypotension - She is well compensated.  No changes to current medication regimen - Continue metoprolol  XL 37.5 mg daily   Palpitations Previous ZIO showed PAT, PVCs, and PVCs - Quiescent and well-controlled on current medication regimen - Continue metoprolol  XL 37.5 mg daily   Hyperlipidemia, LDL goal <70 LDL 45 on 09/2023 and well-controlled - Continue atorvastatin  80 mg daily and ezetimibe  10 mg - Plan for fasting lipid panel and LFTs today      Dispo:  Return in about 6 months (around 04/28/2025).  Signed, Lum LITTIE Louis, NP

## 2024-10-30 ENCOUNTER — Ambulatory Visit: Payer: Self-pay | Admitting: Emergency Medicine

## 2024-11-03 DIAGNOSIS — I5040 Unspecified combined systolic (congestive) and diastolic (congestive) heart failure: Secondary | ICD-10-CM | POA: Diagnosis not present

## 2024-11-03 DIAGNOSIS — E785 Hyperlipidemia, unspecified: Secondary | ICD-10-CM | POA: Diagnosis not present

## 2024-11-03 DIAGNOSIS — I10 Essential (primary) hypertension: Secondary | ICD-10-CM | POA: Diagnosis not present

## 2024-11-03 DIAGNOSIS — F324 Major depressive disorder, single episode, in partial remission: Secondary | ICD-10-CM | POA: Diagnosis not present

## 2024-12-13 ENCOUNTER — Other Ambulatory Visit (HOSPITAL_BASED_OUTPATIENT_CLINIC_OR_DEPARTMENT_OTHER): Payer: Self-pay | Admitting: Family Medicine

## 2024-12-13 DIAGNOSIS — Z1231 Encounter for screening mammogram for malignant neoplasm of breast: Secondary | ICD-10-CM

## 2025-01-06 ENCOUNTER — Ambulatory Visit: Admitting: Pulmonary Disease

## 2025-01-06 ENCOUNTER — Encounter (HOSPITAL_BASED_OUTPATIENT_CLINIC_OR_DEPARTMENT_OTHER)

## 2025-01-09 ENCOUNTER — Ambulatory Visit (HOSPITAL_BASED_OUTPATIENT_CLINIC_OR_DEPARTMENT_OTHER)
Admission: RE | Admit: 2025-01-09 | Discharge: 2025-01-09 | Disposition: A | Source: Ambulatory Visit | Attending: Family Medicine | Admitting: Family Medicine

## 2025-01-09 ENCOUNTER — Encounter (HOSPITAL_BASED_OUTPATIENT_CLINIC_OR_DEPARTMENT_OTHER): Payer: Self-pay

## 2025-01-09 ENCOUNTER — Other Ambulatory Visit (HOSPITAL_BASED_OUTPATIENT_CLINIC_OR_DEPARTMENT_OTHER)

## 2025-01-09 DIAGNOSIS — Z1231 Encounter for screening mammogram for malignant neoplasm of breast: Secondary | ICD-10-CM

## 2025-01-23 ENCOUNTER — Other Ambulatory Visit (HOSPITAL_COMMUNITY)
